# Patient Record
Sex: Female | Born: 1960 | Race: Black or African American | Hispanic: No | Marital: Single | Smoking: Former smoker
Health system: Southern US, Community
[De-identification: ages and names within clinical notes are randomized; demographics above are authoritative.]

## PROBLEM LIST (undated history)

## (undated) DIAGNOSIS — E7800 Pure hypercholesterolemia, unspecified: Secondary | ICD-10-CM

## (undated) DIAGNOSIS — F419 Anxiety disorder, unspecified: Secondary | ICD-10-CM

## (undated) DIAGNOSIS — Q8501 Neurofibromatosis, type 1: Secondary | ICD-10-CM

## (undated) DIAGNOSIS — I10 Essential (primary) hypertension: Secondary | ICD-10-CM

## (undated) DIAGNOSIS — C3490 Malignant neoplasm of unspecified part of unspecified bronchus or lung: Secondary | ICD-10-CM

## (undated) HISTORY — DX: Neurofibromatosis, type 1: Q85.01

## (undated) HISTORY — PX: ABDOMINAL HYSTERECTOMY: SHX81

## (undated) MED FILL — Fosaprepitant Dimeglumine For IV Infusion 150 MG (Base Eq): INTRAVENOUS | Qty: 5 | Status: AC

---

## 2001-03-31 ENCOUNTER — Encounter: Payer: Self-pay | Admitting: Emergency Medicine

## 2001-03-31 ENCOUNTER — Emergency Department (HOSPITAL_COMMUNITY): Admission: EM | Admit: 2001-03-31 | Discharge: 2001-03-31 | Payer: Self-pay | Admitting: Emergency Medicine

## 2001-10-29 ENCOUNTER — Encounter: Payer: Self-pay | Admitting: *Deleted

## 2001-10-29 ENCOUNTER — Ambulatory Visit (HOSPITAL_COMMUNITY): Admission: RE | Admit: 2001-10-29 | Discharge: 2001-10-29 | Payer: Self-pay | Admitting: *Deleted

## 2002-10-08 ENCOUNTER — Other Ambulatory Visit: Admission: RE | Admit: 2002-10-08 | Discharge: 2002-10-08 | Payer: Self-pay | Admitting: *Deleted

## 2002-10-22 ENCOUNTER — Inpatient Hospital Stay (HOSPITAL_COMMUNITY): Admission: RE | Admit: 2002-10-22 | Discharge: 2002-10-24 | Payer: Self-pay | Admitting: *Deleted

## 2003-11-01 ENCOUNTER — Emergency Department (HOSPITAL_COMMUNITY): Admission: EM | Admit: 2003-11-01 | Discharge: 2003-11-01 | Payer: Self-pay | Admitting: Emergency Medicine

## 2003-12-22 ENCOUNTER — Ambulatory Visit (HOSPITAL_COMMUNITY): Admission: RE | Admit: 2003-12-22 | Discharge: 2003-12-22 | Payer: Self-pay | Admitting: Family Medicine

## 2004-11-18 ENCOUNTER — Ambulatory Visit (HOSPITAL_COMMUNITY): Admission: RE | Admit: 2004-11-18 | Discharge: 2004-11-18 | Payer: Self-pay | Admitting: Family Medicine

## 2005-07-12 ENCOUNTER — Ambulatory Visit (HOSPITAL_COMMUNITY): Admission: RE | Admit: 2005-07-12 | Discharge: 2005-07-12 | Payer: Self-pay | Admitting: Family Medicine

## 2005-08-29 ENCOUNTER — Ambulatory Visit (HOSPITAL_COMMUNITY): Admission: RE | Admit: 2005-08-29 | Discharge: 2005-08-29 | Payer: Self-pay | Admitting: Internal Medicine

## 2006-05-06 ENCOUNTER — Emergency Department (HOSPITAL_COMMUNITY): Admission: EM | Admit: 2006-05-06 | Discharge: 2006-05-06 | Payer: Self-pay | Admitting: Emergency Medicine

## 2006-06-07 ENCOUNTER — Emergency Department (HOSPITAL_COMMUNITY): Admission: EM | Admit: 2006-06-07 | Discharge: 2006-06-07 | Payer: Self-pay | Admitting: Emergency Medicine

## 2006-10-08 ENCOUNTER — Emergency Department (HOSPITAL_COMMUNITY): Admission: EM | Admit: 2006-10-08 | Discharge: 2006-10-08 | Payer: Self-pay | Admitting: Emergency Medicine

## 2007-01-16 ENCOUNTER — Emergency Department (HOSPITAL_COMMUNITY): Admission: EM | Admit: 2007-01-16 | Discharge: 2007-01-16 | Payer: Self-pay | Admitting: Emergency Medicine

## 2007-01-25 ENCOUNTER — Ambulatory Visit: Payer: Self-pay | Admitting: Orthopedic Surgery

## 2007-02-15 ENCOUNTER — Encounter: Payer: Self-pay | Admitting: Orthopedic Surgery

## 2007-02-15 ENCOUNTER — Ambulatory Visit (HOSPITAL_COMMUNITY): Admission: RE | Admit: 2007-02-15 | Discharge: 2007-02-15 | Payer: Self-pay | Admitting: Family Medicine

## 2008-01-02 ENCOUNTER — Ambulatory Visit: Payer: Self-pay | Admitting: Orthopedic Surgery

## 2008-01-02 DIAGNOSIS — M545 Low back pain: Secondary | ICD-10-CM

## 2008-01-18 ENCOUNTER — Encounter: Payer: Self-pay | Admitting: Orthopedic Surgery

## 2008-08-05 ENCOUNTER — Emergency Department (HOSPITAL_COMMUNITY): Admission: EM | Admit: 2008-08-05 | Discharge: 2008-08-05 | Payer: Self-pay | Admitting: Emergency Medicine

## 2008-08-28 ENCOUNTER — Ambulatory Visit (HOSPITAL_COMMUNITY): Admission: RE | Admit: 2008-08-28 | Discharge: 2008-08-28 | Payer: Self-pay | Admitting: Family Medicine

## 2009-11-23 ENCOUNTER — Ambulatory Visit (HOSPITAL_COMMUNITY): Admission: RE | Admit: 2009-11-23 | Discharge: 2009-11-23 | Payer: Self-pay | Admitting: Family Medicine

## 2010-09-04 ENCOUNTER — Encounter: Payer: Self-pay | Admitting: Family Medicine

## 2010-09-05 ENCOUNTER — Encounter: Payer: Self-pay | Admitting: *Deleted

## 2010-12-31 NOTE — Op Note (Signed)
NAME:  Denise Rivas, Denise Rivas                      ACCOUNT NO.:  [id number]   MEDICAL RECORD NO.:  [id number]                   PATIENT TYPE:  INP   LOCATION:  A419                                 FACILITY:  [facility]   PHYSICIAN:  Langley Gauss, M.D.                DATE OF BIRTH:  [birth year]   DATE OF PROCEDURE:  10/22/2002  DATE OF DISCHARGE:                                 OPERATIVE REPORT   PREOPERATIVE DIAGNOSES:  1. Menometorrhagia.  2. Dysmenorrhea.  3. Uterine fibroids.   PROCEDURE PERFORMED:  TAH/RSO with the left ovary found to be surgically  absent at time of the operative procedure.   SURGEON:  Langley Gauss, M.D.   ESTIMATED BLOOD LOSS:  250 mL.   ANESTHESIA:  General endotracheal.   DRAINS:  1. Foley catheter to straight drainage.  2. J-P catheter within the subcutaneous space.   SPECIMENS:  For permanent section only.   FINDINGS:  The patient was taken to the operating room.  Vital signs were  stable.  The patient underwent uncomplicated induction of general  endotracheal anesthesia, after which time she was prepped and draped in the  usual sterile manner, and Foley catheter is sterilely placed to straight  drainage with findings of clear-yellow urine.  A sharp knife is then used to  incise the previous Pfannenstiel incision in the skin, dissected down to the  fascial plane utilizing sharp knife, cauterizing bleeders along the way.  Multiple bleeders were cauterized on the belly of the rectus muscle itself.  The fascia was sharply dissected off the underlying rectus muscle utilizing  the Mayo scissors.  Fascial ledges are grasped using straight Kocher clamps,  and then fascia is dissected off the endo and rectus muscle and in the  midline both superiorly and inferiorly, utilizing sharp dissection with the  Mayo scissors.  The rectus muscles were then bluntly separated.  Peritoneal  cavity is atraumatically bluntly entered at the superior-most portion of the  incision.  Peritoneal incision is then extended superiorly and inferiorly.  Inferiorly we directly visualized the bladder to avoid accidental injury.  Self-retaining retractor was then placed to include a bladder blade.  The  uterus manipulated.  There was noted to be an adhesion between small bowel  and the right ovary which was noted to have a 2 cm cystic lesion present.  The left ovary was noted to be surgically absent.  There is history of a  prior tubal ligation.  Moist packs were then used to immobilize bowel out of  the operative field.  Long straight Kocher clamps were then used to grasp  the specimen at the junction of the round ligament and fallopian tube with  the uterus itself.  The right round ligament was then identified.  It was  clamped and secured utilizing 0 Vicryl in a Heaney fashion.  It was then  transected and skeletonization of the right infundibulopelvic ligament was  performed.  The ureter was found to be in its normal anatomic position along  the lateral pelvic sidewall.  The infundibulopelvic ligament on the right is  then doubly clamped utilizing Kelly clamps and then doubly sutured, first  utilizing 0 Vicryl free tie, followed by suture ligature of 0 Vicryl in a  Heaney fashion.  Incidentally during this procedure, a 2 cm simple cyst on  the right is noted to burst with findings of clear follicular fluid.  I then  turned my attention to the left side at which time I was unable to identify  a left ovary, consistent with prior surgical removal.  The left round  ligament was sutured utilizing a 0 Vicryl in a Heaney fashion and what  remains of the infundibulopelvic ligament is then likewise secured utilizing  two sutures, first a free tie of 0 Vicryl followed by a suture ligature of 0  Vicryl in a Heaney fashion.  This then allows me to continue anteriorly  across the lower uterine segment to create a bladder flap from the  vesicouterine fold in the avascular plane.   It is dissected utilizing both  sharp and blunt dissection to push the bladder distal to the cervix itself.  In the process of doing this, I was also skeletonizing the right uterine  vessel which is then clamped with a Kelly clamp, followed by curved Heaney  clamp, followed by single ligature of 0 Vicryl to secure hemostasis on the  right.  Likewise, on the left, the left uterine vessel is skeletonized,  followed by placing a curved Heaney clamp there, followed by suture ligature  of 0 Vicryl.  This results in interruption of the major blood supply to the  uterus, and it is noted to have significant blanching effect at this time of  the uterine fundus.  On the right, then a long straight Heaney clamp is used  to secure the cardinal ligament, followed by suture ligature of 0 Vicryl in  a Heaney fashion; the same is performed on the left.  A second straight  Heaney clamp is then placed on the right to secure the right uterosacral  ligament, followed by suture ligature of 0 Vicryl in a Heaney fashion, in a  likewise manner performed on the left.  At all times, the clamp is placed as  close as possible to the cervix to avoid any lateral injury to the ureters.  This then brought me down to the level of the vaginal angles.  The right  vaginal angle was clamped with a curved Heaney clamp followed by suture  ligature of 0 Vicryl in a Heaney fashion.  Left vaginal angle was then  likewise clamped with curved Heaney clamp followed by suture ligature of 0  Vicryl in a Heaney fashion.  These two are tagged for later inspection.  This then allows me to transect across the vaginal cuff, attempting to  maintain maximal vaginal length while removing the entirety of the cervix.  The specimen is then removed and examined and noted to have the entire  cervix incorporated.  The edges of the vaginal mucosa at the cuff were then  grasped utilizing straight Kocher clamps.  Irrigation is performed utilizing a  Betadine solution, and the cuff is then closed utilizing a total of four  figure-of-eight sutures of 0 Vicryl to result in excellent hemostasis.  Irrigation is then performed of the pelvic cavity.  Hemostasis is assured  within the pelvic cavity where there was noted to be a small  amount of  bleeding from the right round ligament which is then on subsequent occasions  controlled by attempting to grasp the bleeder with a Vanderbilt clamp  followed by suture ligature of 0 Vicryl and a third attempt while placing  various superficial sutures, I was able to secure hemostasis on the right  round ligament area.  Copious irrigation was then again performed until  clear.  Large bowel was returned to the pelvic cavity.  Peritoneal edges are  then grasped using Kelly clamps; the Balfour retractor is removed as well as  moist packs.  Sponge and instrument counts are correct x 2 at this point.  The peritoneal edges are then reapproximated closed utilizing 0 chromic in a  running fashion with irrigation again performed of the pelvic cavity just  prior to closure to assure hemostasis.  The rectus muscles then  reapproximated midline utilizing two layer closure of 0 chromic in a running  fashion.  Secure bites are taken, as there was noted to be multiple small  bleeders along the rectus muscle itself which are cauterized.  With all  rectus muscle bleeders cauterized, the fascia is then closed with a  continuous running 0 PDS suture.  Subcutaneous bleeders are then cauterized.  The J-P drain is placed within the subcutaneous space where this prevents it  going to the right apex annexation.  J-P drain is sutured into place.  The  skin is then completely closed utilizing skin staples.  To facilitate  postoperative analgesia, a total of 30 mL of 0.5% bupivacaine plain is  injected along the entire incision.  The patient continues to drain clear-  yellow urine.  She is then reversed of anesthesia, taken to  recovery room in  stable condition with some operative findings discussed with the patient's  awaiting family.                                               Langley Gauss, M.D.    DC/MEDQ  D:  10/22/2002  T:  10/22/2002  Job:  [id number]

## 2010-12-31 NOTE — Discharge Summary (Signed)
NAME:  Denise Rivas, GRILLI                      ACCOUNT NO.:  [id number]   MEDICAL RECORD NO.:  [id number]                   PATIENT TYPE:  INP   LOCATION:  A419                                 FACILITY:  [facility]   PHYSICIAN:  Langley Gauss, M.D.                DATE OF BIRTH:  May 01, 1961   DATE OF ADMISSION:  10/22/2002  DATE OF DISCHARGE:  10/24/2002                                 DISCHARGE SUMMARY   DISPOSITION:  Given copies of standard discharge instructions at time of  discharge.  The patient is to call and follow up in the office in four days'  time for staple removal from the Pfannenstiel incision.  The JP drain is  removed on day of discharge.   DISCHARGE MEDICATIONS:  1. Estrace 1 mg p.o. daily.  2. Tylox 1-2 every four hours p.r.n. postoperative pain #30 with no refill.   LABORATORY DATA:  Pertinent laboratory studies:  O+ blood type, hCG is  negative.  Electrolytes within normal limits.  Admission hemoglobin and  hematocrit 12.9/39.8 with a white count of 6.1.  On postoperative day #1,  hemoglobin 9.8, hematocrit 30.1.  On postoperative day #2, hemoglobin had  equilibrated with a hemoglobin of 10.6, hematocrit 32.7.  Final pathology is  currently pending.   HOSPITAL COURSE:  The patient underwent TAH/RSO on October 22, 2002.  Operative  procedure was without complications.  The patient had a prior Pfannenstiel  incision which we utilized at time of the procedure.  The left ovary was  noted to be absent at time of the operative procedure.  The patient's  postoperative course was complicated by on postoperative day #1 findings of  bright red blood seeping around the dressing.  I was thus contacted for  immediate assessment.  At that time, her dressing was removed.  There was  noted to be about 25 mL of clotted blood along the incision line itself but  no active bleeding and no significant induration; however, there was a  concern of some subcutaneous bleeding or hematoma  formation postoperatively,  thus during the operative procedure many vessels had been cauterized in the  subcutaneous fat, in addition with the hemoglobin falling to 9.8 on  postoperative day #1.  This was a larger fall than expected.  The JP drain  was aspirated and a pressure dressing was applied.  The patient continued to  do well on the evening of postoperative day #1.  The patient did have  significant gas pain but was unable to have a bowel movement.  She was  treated with a Dulcolax suppository, at which time she did move her bowels.  Thereafter, on October 24, 2002 the patient is ambulating, tolerating a  regular general diet very well.  No complaints of dizziness or headaches.  On examination, the abdomen is obese, soft, and nontender.  There is no  evidence of any renewed bleeding visibly or in the subcutaneous  tissue.  Thus, on postoperative day #2, the patient is discharged to home.  The  patient has decided that she would prefer to use an oral estrogen rather  than continue to use the patch postoperatively.                                               Langley Gauss, M.D.    DC/MEDQ  D:  10/24/2002  T:  10/24/2002  Job:  [id number]

## 2010-12-31 NOTE — H&P (Signed)
NAME:  Denise Rivas, Denise Rivas                      ACCOUNT NO.:  [id number]   MEDICAL RECORD NO.:  [id number]                   PATIENT TYPE:  AMB   LOCATION:  DAY                                  FACILITY:  [facility]   PHYSICIAN:  Langley Gauss, M.D.                DATE OF BIRTH:  Oct 06, 1960   DATE OF ADMISSION:  10/22/2002  DATE OF DISCHARGE:                                HISTORY & PHYSICAL   HISTORY OF PRESENT ILLNESS:  This is a 50 year old black female gravida 1,  para 1 with the primary complaint of menometrorrhagia and uterine fibroids,  as well as dysmenorrhea, who is admitted for a TAH/BSO.  The risks and  benefits of the operative procedure were discussed with the patient, at  which time she elects to proceed with definitive surgical management.  She  understands that removal of the ovaries will require initiation of hormonal  replacement therapy to avoid symptoms of surgical menopause.   The patient's history is that she was initially seen as a self-referral on  October 23, 2000, at which time she complained of a 15-year duration of very  heavy menses.  At that time, she stated her menstrual periods occurred on a  monthly basis with four days of flow but passing clots that looked like  meat.  She had previously taken birth control pills x1 month's duration,  which did not provide any help in November 2001.  She declines any repeat  trial of oral contraceptive.  The patient has been taking 800 mg of Motrin 3-  4 times a day previously, which did not help abate the bleeding quantity or  the cramping.   PAST MEDICAL HISTORY:  1. Tubal ligation in 1981.  2. Hernia repair in 1981.  3. She has one prior vaginal delivery.  4. No other medical or surgical illness.  5. The patient is noted to have bulging disks by a previous MRI study.  6. Open cholecystectomy.   ALLERGIES:  She has no known drug allergies.   SOCIAL HISTORY:  The patient does smoke one-half pack per day and is  unemployed.   REVIEW OF SYSTEMS:  Pertinent for no history of genuine stress urinary  incontinence.  The patient does, however, have occasional episodes where she  cannot hold her urine.   PHYSICAL EXAMINATION:  GENERAL:  A black female.  VITAL SIGNS:  Height is 5 feet 0 inches.  Weight 124.  Blood pressure  132/97, 128/86.  Pulse of 77.  HEENT:  Negative.  Neck is supple.  Mucous membranes are moist.  Thyroid is  nonpalpable.  LUNGS:  Clear.  CARDIOVASCULAR:  Regular rate and rhythm.  ABDOMEN:  Soft and nontender.  The patient has a small previous Pfannenstiel  incision from a prior open cholecystectomy.  EXTREMITIES:  Normal.  PELVIC:  Normal external genitalia.  No lesions or ulcerations identified.  Bimanual examination reveals a diffusely enlarged globular uterus  with the  adnexa noted to be nonpalpable.   LABORATORY STUDIES:  A previous pelvic ultrasound done June 19, 2000  revealed a 2.5-cm diameter uterine fibroid in the right fundal portion.  Endometrial stripe upper normal at 8.6.  Previous MRI's have revealed  degenerative disk disease L4-5, L5-S1 and to a lesser degree L3-4.  No disk  herniation was identified.  Endometrial biopsy performed in the office was  negative for hyperplasia and negative for any atypia or endometrial cancer.   ASSESSMENT:  A 50 year old patient represented this year, now with  complaints of increasingly heavy menstrual periods that are now becoming  very irregular since previous visit.  At that point in time, she was not  prepared to but at this visit she did state her lifestyle was compromised  enough that she is willing to proceed with definitive surgical therapy.  Thus, on October 22, 2002, she will be taken to the operating room for  performance of a total abdominal hysterectomy/bilateral salpingo-  oophorectomy.                                               Langley Gauss, M.D.    DC/MEDQ  D:  10/21/2002  T:  10/21/2002  Job:   [id number]

## 2011-12-28 ENCOUNTER — Other Ambulatory Visit (HOSPITAL_COMMUNITY): Payer: Self-pay | Admitting: Family Medicine

## 2011-12-28 DIAGNOSIS — Z139 Encounter for screening, unspecified: Secondary | ICD-10-CM

## 2012-01-23 ENCOUNTER — Ambulatory Visit (HOSPITAL_COMMUNITY): Payer: Self-pay

## 2012-01-23 ENCOUNTER — Ambulatory Visit (HOSPITAL_COMMUNITY)
Admission: RE | Admit: 2012-01-23 | Discharge: 2012-01-23 | Disposition: A | Discharge status: Home / Self Care | Payer: Medicaid Other | Source: Ambulatory Visit | Attending: Family Medicine | Admitting: Family Medicine

## 2012-01-23 DIAGNOSIS — Z139 Encounter for screening, unspecified: Secondary | ICD-10-CM

## 2012-01-23 DIAGNOSIS — Z1231 Encounter for screening mammogram for malignant neoplasm of breast: Secondary | ICD-10-CM | POA: Insufficient documentation

## 2013-04-29 ENCOUNTER — Other Ambulatory Visit (HOSPITAL_COMMUNITY): Payer: Self-pay | Admitting: Family Medicine

## 2013-04-29 DIAGNOSIS — Z139 Encounter for screening, unspecified: Secondary | ICD-10-CM

## 2013-05-02 ENCOUNTER — Ambulatory Visit (HOSPITAL_COMMUNITY)
Admission: RE | Admit: 2013-05-02 | Discharge: 2013-05-02 | Disposition: A | Discharge status: Home / Self Care | Payer: PRIVATE HEALTH INSURANCE | Source: Ambulatory Visit | Attending: Family Medicine | Admitting: Family Medicine

## 2013-05-02 DIAGNOSIS — Z1231 Encounter for screening mammogram for malignant neoplasm of breast: Secondary | ICD-10-CM | POA: Insufficient documentation

## 2013-05-02 DIAGNOSIS — Z139 Encounter for screening, unspecified: Secondary | ICD-10-CM

## 2014-05-01 ENCOUNTER — Other Ambulatory Visit (HOSPITAL_COMMUNITY): Payer: Self-pay | Admitting: Family Medicine

## 2014-05-01 DIAGNOSIS — Z139 Encounter for screening, unspecified: Secondary | ICD-10-CM

## 2014-05-05 ENCOUNTER — Ambulatory Visit (HOSPITAL_COMMUNITY)
Admission: RE | Admit: 2014-05-05 | Discharge: 2014-05-05 | Disposition: A | Discharge status: Home / Self Care | Payer: Medicare HMO | Source: Ambulatory Visit | Attending: Family Medicine | Admitting: Family Medicine

## 2014-05-05 DIAGNOSIS — Z139 Encounter for screening, unspecified: Secondary | ICD-10-CM | POA: Insufficient documentation

## 2014-10-06 ENCOUNTER — Ambulatory Visit (HOSPITAL_COMMUNITY)
Admission: RE | Admit: 2014-10-06 | Discharge: 2014-10-06 | Disposition: A | Discharge status: Home / Self Care | Payer: Medicare Other | Source: Ambulatory Visit | Attending: Family Medicine | Admitting: Family Medicine

## 2014-10-06 ENCOUNTER — Other Ambulatory Visit (HOSPITAL_COMMUNITY)
Admission: RE | Admit: 2014-10-06 | Discharge: 2014-10-06 | Disposition: A | Discharge status: Home / Self Care | Payer: Medicare Other | Source: Ambulatory Visit | Attending: Family Medicine | Admitting: Family Medicine

## 2014-10-06 ENCOUNTER — Other Ambulatory Visit (HOSPITAL_COMMUNITY): Payer: Self-pay | Admitting: Family Medicine

## 2014-10-06 DIAGNOSIS — M719 Bursopathy, unspecified: Secondary | ICD-10-CM

## 2014-10-06 DIAGNOSIS — M19011 Primary osteoarthritis, right shoulder: Secondary | ICD-10-CM

## 2014-10-06 DIAGNOSIS — M25511 Pain in right shoulder: Secondary | ICD-10-CM | POA: Insufficient documentation

## 2014-10-06 LAB — CBC WITH DIFFERENTIAL/PLATELET
BASOS ABS: 0 10*3/uL (ref 0.0–0.1)
Basophils Relative: 0 % (ref 0–1)
EOS ABS: 0.1 10*3/uL (ref 0.0–0.7)
EOS PCT: 1 % (ref 0–5)
HCT: 43.8 % (ref 36.0–46.0)
HEMOGLOBIN: 14.5 g/dL (ref 12.0–15.0)
LYMPHS ABS: 1.6 10*3/uL (ref 0.7–4.0)
Lymphocytes Relative: 22 % (ref 12–46)
MCH: 31.9 pg (ref 26.0–34.0)
MCHC: 33.1 g/dL (ref 30.0–36.0)
MCV: 96.3 fL (ref 78.0–100.0)
Monocytes Absolute: 0.6 10*3/uL (ref 0.1–1.0)
Monocytes Relative: 8 % (ref 3–12)
NEUTROS PCT: 69 % (ref 43–77)
Neutro Abs: 4.9 10*3/uL (ref 1.7–7.7)
Platelets: 266 10*3/uL (ref 150–400)
RBC: 4.55 MIL/uL (ref 3.87–5.11)
RDW: 13.6 % (ref 11.5–15.5)
WBC: 7.1 10*3/uL (ref 4.0–10.5)

## 2014-10-06 LAB — LIPID PANEL
Cholesterol: 188 mg/dL (ref 0–200)
HDL: 71 mg/dL (ref >39)
LDL Cholesterol: 102 mg/dL — ABNORMAL HIGH (ref 0–99)
TRIGLYCERIDES: 75 mg/dL (ref <150)
Total CHOL/HDL Ratio: 2.6 RATIO
VLDL: 15 mg/dL (ref 0–40)

## 2014-10-06 LAB — COMPREHENSIVE METABOLIC PANEL
ALBUMIN: 4.1 g/dL (ref 3.5–5.2)
ALT: 14 U/L (ref 0–35)
AST: 20 U/L (ref 0–37)
Alkaline Phosphatase: 81 U/L (ref 39–117)
Anion gap: 5 (ref 5–15)
BUN: 18 mg/dL (ref 6–23)
CALCIUM: 9.4 mg/dL (ref 8.4–10.5)
CO2: 27 mmol/L (ref 19–32)
Chloride: 108 mmol/L (ref 96–112)
Creatinine, Ser: 0.61 mg/dL (ref 0.50–1.10)
GFR calc Af Amer: >90 mL/min (ref >90)
GFR calc non Af Amer: >90 mL/min (ref >90)
Glucose, Bld: 108 mg/dL — ABNORMAL HIGH (ref 70–99)
Potassium: 4.1 mmol/L (ref 3.5–5.1)
Sodium: 140 mmol/L (ref 135–145)
Total Bilirubin: 0.5 mg/dL (ref 0.3–1.2)
Total Protein: 7.5 g/dL (ref 6.0–8.3)

## 2014-10-06 LAB — TSH: TSH: 0.505 u[IU]/mL (ref 0.350–4.500)

## 2014-10-07 LAB — T4: T4 TOTAL: 6.9 ug/dL (ref 4.5–12.0)

## 2014-12-09 ENCOUNTER — Ambulatory Visit (INDEPENDENT_AMBULATORY_CARE_PROVIDER_SITE_OTHER): Payer: Medicare Other | Admitting: Orthopedic Surgery

## 2014-12-09 VITALS — BP 116/82 | Ht 61.0 in | Wt 123.4 lb

## 2014-12-09 DIAGNOSIS — M545 Low back pain: Secondary | ICD-10-CM

## 2014-12-09 DIAGNOSIS — M75101 Unspecified rotator cuff tear or rupture of right shoulder, not specified as traumatic: Secondary | ICD-10-CM | POA: Diagnosis not present

## 2014-12-09 MED ORDER — DICLOFENAC POTASSIUM 50 MG PO TABS: 50.0000 mg | ORAL_TABLET | Freq: Two times a day (BID) | ORAL | Status: DC

## 2014-12-09 NOTE — Patient Instructions (Signed)
Call [facility] to schedule PT and OT

## 2014-12-09 NOTE — Progress Notes (Signed)
Patient ID: Denise Rivas, female   DOB: [birth year], 54 y.o.   MRN: [id number] Patient ID: Denise Rivas, female   DOB: 1961-04-11, 54 y.o.   MRN: [id number]  Chief Complaint  Patient presents with  . Shoulder Pain    right shoulder pain, no known injury, REF. [place]    HPI [name] is a 54 y.o. female.  Presents to [place] for evaluation of her right shoulder with also complaints of lower back pain nonradicular in nature  As far as the shoulder goes she's had pain for several months with no history of trauma. She reports pain over the right deltoid area associated with stiffness radiating pain in the neck and upper arm which is constant 9 out of 10 and not relieved by naproxen 500. X-rays and injections have already been attempted without relief and she does not want a new injection today although it was strongly recommended  Medical history of hypertension and arthritis she's had a hysterectomy and a cholecystectomy   She has no allergies  Family history is noted for diabetes asthma COPD reflux depression high blood pressure heart attack cancer arthritis mental illness  She also has multiple skin lesions all over her body with no apparent diagnosis that she can remember only noting that she said the doctors have said something in my blood  She is disabled does not smoke or drink    Review of Systems Review of Systems She has a history of joint pain muscle weakness back pain anxiety numbness and tingling and burning in the legs and lightheadedness  Social History History  Substance Use Topics  . Smoking status: Not on file  . Smokeless tobacco: Not on file  . Alcohol Use: Not on file    Allergies not on file  Current Outpatient Prescriptions  Medication Sig Dispense Refill  . amLODipine (NORVASC) 5 MG tablet Take 5 mg by mouth daily.    . citalopram (CELEXA) 20 MG tablet Take 20 mg by mouth daily.    Marland Kitchen gabapentin (NEURONTIN) 300 MG capsule Take 300 mg by mouth  at bedtime.    . naproxen (NAPROSYN) 500 MG tablet Take 500 mg by mouth 2 (two) times daily with a meal.    . diclofenac (CATAFLAM) 50 MG tablet Take 1 tablet (50 mg total) by mouth 2 (two) times daily. 180 tablet 1   No current facility-administered medications for this visit.      Physical Exam Physical Exam Blood pressure 116/82, height  (1.549 m), weight 123 lb 6.4 oz (55.974 kg).  Gen. appearance well-developed well-nourished female she has these multiple skin lesions which are everywhere there lesions that protrude from the skin and have had like appearance nontender nonpigmented The patient is alert and oriented person place and time Mood is normal affect is normal Ambulatory status normal without assistive devices  Exam of the right shoulder  Inspection . Acromial tenderness ROM painful [place] elevation up to 110 active and 160 passive. 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  . Low back pain, unspecified back pain laterality, with sciatica presence unspecified   . Rotator cuff syndrome, right Yes        Plan    We recommended subacromial injection she declined we stopped her naproxen and put her on diclofenac for pain we send her to physical therapy. If she doesn't improve with told her that she has to get the injection as as part of the workup and  treatment for this problem of rotator cuff syndrome  We also sent her for lumbar spine stabilization exercises related to her unrelated back pain       Fuller CanadaStanley Kerim Statzer 12/09/2014, 9:21 AM

## 2014-12-23 ENCOUNTER — Ambulatory Visit (HOSPITAL_COMMUNITY): Payer: Medicare Other | Admitting: Physical Therapy

## 2014-12-23 ENCOUNTER — Ambulatory Visit (HOSPITAL_COMMUNITY): Payer: Medicare Other | Attending: Orthopedic Surgery

## 2014-12-23 ENCOUNTER — Encounter (HOSPITAL_COMMUNITY): Payer: Self-pay

## 2014-12-23 DIAGNOSIS — M25811 Other specified joint disorders, right shoulder: Secondary | ICD-10-CM | POA: Insufficient documentation

## 2014-12-23 DIAGNOSIS — M25511 Pain in right shoulder: Secondary | ICD-10-CM | POA: Diagnosis not present

## 2014-12-23 DIAGNOSIS — M6289 Other specified disorders of muscle: Secondary | ICD-10-CM

## 2014-12-23 DIAGNOSIS — R29898 Other symptoms and signs involving the musculoskeletal system: Secondary | ICD-10-CM

## 2014-12-23 DIAGNOSIS — R6889 Other general symptoms and signs: Secondary | ICD-10-CM

## 2014-12-23 DIAGNOSIS — M5441 Lumbago with sciatica, right side: Secondary | ICD-10-CM

## 2014-12-23 DIAGNOSIS — M25611 Stiffness of right shoulder, not elsewhere classified: Secondary | ICD-10-CM

## 2014-12-23 DIAGNOSIS — M629 Disorder of muscle, unspecified: Secondary | ICD-10-CM | POA: Diagnosis not present

## 2014-12-23 DIAGNOSIS — M75101 Unspecified rotator cuff tear or rupture of right shoulder, not specified as traumatic: Secondary | ICD-10-CM | POA: Diagnosis not present

## 2014-12-23 NOTE — Therapy (Addendum)
[place] [facility] [address] [place], [place], [address] Phone: [phone]   Fax:  [phone]  Occupational Therapy Evaluation  Patient Details  Name: Denise HoffMary L Rivas MRN: [id number] Date of Birth: 01/11/1961 Referring Provider:  [name], Stanley E, MD  Encounter Date: 12/23/2014      OT End of Session - 12/23/14 0942    Visit Number 1   Number of Visits 8   Date for OT Re-Evaluation 01/20/15   Authorization Type UHC Medicare primary Medicaid secondary   Authorization Time Period before 10th visit   Authorization - Visit Number 1   Authorization - Number of Visits 10   OT Start Time 0848   OT Stop Time 0930   OT Time Calculation (min) 42 min   Activity Tolerance Patient tolerated treatment well   Behavior During Therapy [facility] for tasks assessed/performed      History reviewed. No pertinent past medical history.  No past surgical history on file.  There were no vitals filed for this visit.  Visit Diagnosis:  Rotator cuff syndrome of right shoulder - Plan: Ot plan of care cert/re-cert  Pain in joint, shoulder region, right - Plan: Ot plan of care cert/re-cert  Weakness of right arm - Plan: Ot plan of care cert/re-cert  Tight fascia - Plan: Ot plan of care cert/re-cert  Decreased range of motion of shoulder, right - Plan: Ot plan of care cert/re-cert      Subjective Assessment - 12/23/14 0854    Subjective  S: I don't always have pain. it comes and goes.    Pertinent History Patient is a 54 y/o female s/p right rotator cuff syndrome which she states has been ongoing for a year now. Patient reports no injury to shoulder. Pt states that she doesn't have consistant pain. Pain is intermittent and it comes and goes. [name] has referred patient to occupational therapy for evaulation and treatment.    Special Tests FOTO: 61/100   Patient Stated Goals TO decrease pain.   Currently in Pain? No/denies  periodic pain/stabbing/  10/10           [facility] - 12/23/14 0854    Assessment   Diagnosis Right rotator cuff syndrome   Onset Date --  approx. a year ago.   Prior Therapy None   Precautions   Precautions None   Restrictions   Weight Bearing Restrictions No   Balance Screen   Has the patient fallen in the past 6 months No   Home  Environment   Family/patient expects to be discharged to: Private residence   Living Arrangements Alone   Prior Function   Level of Independence Independent with basic ADLs;Independent with gait   ADL   ADL comments Pt reports that she is able to do all daily and leisure tasks regardless of the pain. Pt states that she'll notice the pain after she has mowing the lawn for example or have been working with her flowers in the garden.    Mobility   Mobility Status Independent   Written Expression   Dominant Hand Right   Vision - History   Baseline Vision No visual deficits   Cognition   Overall Cognitive Status Within Functional Limits for tasks assessed   Observation/Other Assessments   Focus on Therapeutic Outcomes (FOTO)  61   ROM / Strength   AROM / PROM / Strength AROM;Strength;PROM   Palpation   Palpation Max trigger points/tender points in right upper arm, trapezius,  and scapularis region.   AROM   Overall AROM Comments Assessed seated. IR/ER adducted.   AROM Assessment Site Shoulder   Right/Left Shoulder Right   Right Shoulder Flexion 140 Degrees   Right Shoulder ABduction 115 Degrees   Right Shoulder Internal Rotation 90 Degrees   Right Shoulder External Rotation 90 Degrees   PROM   Overall PROM Comments Assessed seated. IR/ER adducted.   PROM Assessment Site Shoulder   Right/Left Shoulder Right   Right Shoulder Flexion 149 Degrees   Right Shoulder ABduction 121 Degrees   Right Shoulder Internal Rotation 90 Degrees   Right Shoulder External Rotation 90 Degrees   Strength   Overall Strength Comments Assessed seated. IR/ER adducted.   Strength  Assessment Site Shoulder   Right/Left Shoulder Right   Right Shoulder Flexion 5/5   Right Shoulder ABduction 5/5   Right Shoulder Internal Rotation 4+/5   Right Shoulder External Rotation 4+/5                         OT Education - 12/28/2014 0941    Education provided Yes   Education Details shoulder stretches and theraband scapular strengthening exercises   Person(s) Educated Patient   Methods Explanation;Demonstration;Handout   Comprehension Verbalized understanding;Need further instruction;Returned demonstration;Verbal cues required;Tactile cues required          OT Short Term Goals - 12-28-2014 0947    OT SHORT TERM GOAL #1   Title Patient will be educated and independent with HEP.   Time 4   Period Weeks   Status New   OT SHORT TERM GOAL #2   Title Patient will decrease pain level to 5/10 or less after completing lawn mowing.    Time 4   Period Weeks   Status New   OT SHORT TERM GOAL #3   Title Patient will increase scapular stability and posture by 75%.   Time 4   Period Weeks   Status New   OT SHORT TERM GOAL #4   Title Patient increase RUE shoulder strength to 5/5.   Time 4   Period Weeks   Status New   OT SHORT TERM GOAL #5   Title Patient will decrease fascial restrctions and trigger points to trace amount.    Time 4   Period Weeks   Status New                  Plan - 2014/12/28 0943    Clinical Impression Statement A: Pt is a 54 y/o female s/p right rotator cuff syndrome causing increased pain and fascial restrctions and decreased strength, scapular stability, and ROM resulting in discomfort after completing daily tasks.    Pt will benefit from skilled therapeutic intervention in order to improve on the following deficits (Retired) Decreased range of motion;Increased fascial restricitons;Pain;Decreased strength   Rehab Potential Excellent   OT Frequency 2x / week   OT Duration 4 weeks   OT Treatment/Interventions Self-care/ADL  training;Therapeutic exercise;Patient/family education;Manual Therapy;Ultrasound;Therapeutic activities;Cryotherapy;Electrical Stimulation;Passive range of motion;Moist Heat   Plan P: Pt will benefit from skilled OT services to increase overal functional performance during daily tasks and comfort level and strength of RUE. Treatment Plan: myofascial release and muscle energy technique (Patient is very tender in shoulder region even with light pressure), scapular strability and strengthening exercises, AROM, and general strengthening.    Consulted and Agree with Plan of Care Patient          G-Codes - 12/28/2014 9604  Functional Assessment Tool Used FOTO score: 61/100 (39% impaired)   Functional Limitation Carrying, moving and handling objects   Carrying, Moving and Handling Objects Current Status ([id number](G8984) At least 20 percent but less than 40 percent impaired, limited or restricted   Carrying, Moving and Handling Objects Goal Status ([id number](G8985) At least 1 percent but less than 20 percent impaired, limited or restricted      Problem List Patient Active Problem List   Diagnosis Date Noted  . BACK PAIN, LUMBAR 01/02/2008    Limmie PatriciaLaura Ralonda Tartt, OTR/L,CBIS  [phone]  12/23/2014, 9:58 AM  [facility] [address] [place], [place], [address] Phone: [phone]   Fax:  [phone]

## 2014-12-23 NOTE — Addendum Note (Signed)
Addended by: Limmie PatriciaESSENMACHER, Alvita Fana D on: 12/23/2014 10:00 AM   Modules accepted: Orders

## 2014-12-23 NOTE — Patient Instructions (Signed)
Knee-to-Chest Stretch: Bilateral   With hands behind knees, pull both knees in to chest until a comfortable stretch is felt in lower back and buttocks. Keep back relaxed. Hold __20__ seconds. Repeat __5__ times per set. Do _1___ sets per session. Do ___2_ sessions per day.  http://orth.exer.us/128   Copyright  VHI. All rights reserved.  Pelvic Tilt   Flatten back by tightening stomach muscles and buttocks. Repeat __10__ times per set. Do __1__ sets per session. Do _2___ sessions per day.  http://orth.exer.us/134   Copyright  VHI. All rights reserved.  Isometric Abdominal   Lying on back with knees bent, tighten stomach by pressing elbows down. Hold ___3_ seconds. Repeat __10__ times per set. Do _1___ sets per session. Do ___2_ sessions per day.  http://orth.exer.us/1086   Copyright  VHI. All rights reserved.  Bridging   Slowly raise buttocks from floor, keeping stomach tight. Repeat _10___ times per set. Do ___1_ sets per session. Do _2___ sessions per day.  http://orth.exer.us/1096   Copyright  VHI. All rights reserved.

## 2014-12-23 NOTE — Patient Instructions (Signed)
  Flexibility: Corner Stretch   Standing in corner with hands just above shoulder level and feet ____ inches from corner, lean forward until a comfortable stretch is felt across chest. Hold ____ seconds. Repeat ____ times per set. Do ____ sets per session. Do ____ sessions per day.    Flexors Stretch, Standing   Stand near wall and slide arm up, with palm facing away from wall, by leaning toward wall. Hold ___ seconds.  Repeat ___ times per session. Do ___ sessions per day.  Copyright  VHI. All rights reserved.   (Home) Extension: Isometric / Bilateral Arm Retraction - Sitting   Facing anchor, hold hands and elbow at shoulder height, with elbow bent.  Pull arms back to squeeze shoulder blades together. Repeat 10-15 times.  Copyright  VHI. All rights reserved.   (Home) Retraction: Row - Bilateral (Anchor)   Facing anchor, arms reaching forward, pull hands toward stomach, keeping elbows bent and at your sides and pinching shoulder blades together. Repeat 10-15 times.  Copyright  VHI. All rights reserved.   (Clinic) Extension / Flexion (Assist)   Face anchor, pull arms back, keeping elbow straight, and squeze shoulder blades together. Repeat 10-15 times.   Copyright  VHI. All rights reserved.

## 2014-12-23 NOTE — Therapy (Signed)
[facility] [address] [place], [place], [address] Phone: [phone]   Fax:  [phone]  Physical Therapy Evaluation  Patient Details  Name: Denise Rivas MRN: [id number] Date of Birth: 08/31/1960 Referring Provider:  [name], Stanley E, MD  Encounter Date: 12/23/2014      PT End of Session - 12/23/14 1005    Visit Number 1   Number of Visits 12   Date for PT Re-Evaluation 02/06/15   Authorization - Visit Number 1   Authorization - Number of Visits 10   PT Start Time 0930   PT Stop Time 1015   PT Time Calculation (min) 45 min   Activity Tolerance Patient tolerated treatment well      No past medical history on file.  No past surgical history on file.  There were no vitals filed for this visit.  Visit Diagnosis:  Midline low back pain with right-sided sciatica  Leg weakness, bilateral  Postural instability of trunk      Subjective Assessment - 12/23/14 0934    Subjective Denise Rivas has had chronic low back pain for years  but recently her pain has increased.  She states that her pain is mainly in the center and when she sits down her low back will be burning.  She occasionally has pain that goes into her upper Rt thigh.     How long can you sit comfortably? Pt sits slumped.   Pt states that the burning occurs almost immediately.     How long can you stand comfortably? able to stand for five minutes and then her back starts to burn.    How long can you walk comfortably? Pt does not walk    Patient Stated Goals no more burning in her back    Currently in Pain? Yes   Pain Score 10-Worst pain ever  therapist explained scale pt still states a 10; no signs of distress   Pain Location Back   Pain Orientation Lower   Pain Descriptors / Indicators Aching;Burning   Pain Onset More than a month ago   Pain Frequency Constant   Aggravating Factors  sitting    Pain Relieving Factors medication             [facility]  PT Assessment - 12/23/14 0816    Assessment   Medical Diagnosis Radicular back pain   Onset Date 11/28/14   Prior Therapy none   Precautions   Precautions None   Restrictions   Weight Bearing Restrictions No   Balance Screen   Has the patient fallen in the past 6 months No   Has the patient had a decrease in activity level because of a fear of falling?  Yes   Is the patient reluctant to leave their home because of a fear of falling?  No   Home Environment   Living Enviornment Private residence   Living Arrangements Alone   Type of Home House   Prior Function   Level of Independence Independent with basic ADLs   Vocation On disability   Cognition   Overall Cognitive Status Within Functional Limits for tasks assessed   Observation/Other Assessments   Focus on Therapeutic Outcomes (FOTO)  66   Posture/Postural Control   Posture/Postural Control Postural limitations   Postural Limitations Increased lumbar lordosis;Decreased thoracic kyphosis;Anterior pelvic tilt   AROM   Lumbar Flexion wnl reps no change   Lumbar Extension wnl reps no change    Lumbar - Right Side  Bend wnl   Lumbar - Left Side Bend wnl   Lumbar - Right Rotation decreased 20%   Lumbar - Left Rotation decreased 20%    Strength   Right Hip Flexion 3/5   Right Hip Extension 3-/5   Right Hip ABduction 5/5   Left Hip Flexion 5/5   Left Hip Extension 3/5   Left Hip ABduction 4/5   Right Knee Flexion 4-/5   Right Knee Extension 3+/5   Left Knee Flexion 4-/5   Left Knee Extension 4-/5   Right Ankle Dorsiflexion 3+/5   Left Ankle Dorsiflexion 4/5   Flexibility   Soft Tissue Assessment /Muscle Length yes   Hamstrings wfl    Quadriceps Rt heel 9" from buttock; LT 8" from buttock              [facility] Adult PT Treatment/Exercise - 12/23/14 0816    Exercises   Exercises Lumbar   Lumbar Exercises: Stretches   Double Knee to Chest Stretch 5 reps;10 seconds   Pelvic Tilt 5 reps   Lumbar Exercises: Supine   Ab  Set 5 reps   Bridge 5 reps            PT Education - 12/23/14 1003    Education provided Yes   Education Details abdominal strengthening exercises and bridges    Person(s) Educated Patient   Methods Explanation;Verbal cues;Handout   Comprehension Verbalized understanding;Returned demonstration;Need further instruction          PT Short Term Goals - 12/23/14 1013    PT SHORT TERM GOAL #1   Title I HEP   Time 2   Period Weeks   PT SHORT TERM GOAL #2   Title Pt pain to be no greater than a 7/10 90% of the day    Time 3   Period Weeks   PT SHORT TERM GOAL #3   Title Pt rotation to be wnl to allow pt to look at blind side when driving a car    Time 3   Period Weeks           PT Long Term Goals - 12/23/14 1015    PT LONG TERM GOAL #1   Title Pt to be I in advance HEP   Time 6   Period Weeks   PT LONG TERM GOAL #2   Title Pt pain to be no greater than a 5/10 80% of the day    Time 6   Period Weeks   PT LONG TERM GOAL #3   Title Pt to be able to sit for 40 minutes with comfort to allow car riding and eating of meals in comfort    Time 6   Period Weeks   PT LONG TERM GOAL #4   Title Pt to be able to stand for 15 minutes to be able to make a amall meal in comfort.   Time 6   Period Weeks   PT LONG TERM GOAL #5   Title Pt to be walking at least 15 minutes 4 times a week for better health habits    Time 6   Period Weeks               Plan - 12/23/14 1005    Clinical Impression Statement Pt is a 54 yo female who has had chronic back pain and is now being referred to physical therapy.  She has been referred to therapy to attempt to decrease her pain.  Examination demonstrates decreased core  and LE strength, poor sitting posture and decreased ROM.  Denise Rivas will benefit from skilled therapy to address these issues and decrease her pain in order to improve her quality of life.     Pt will benefit from skilled therapeutic intervention in order to improve  on the following deficits Decreased activity tolerance;Decreased range of motion;Decreased strength;Pain   Rehab Potential Good   PT Frequency 2x / week   PT Duration 6 weeks   PT Treatment/Interventions Therapeutic activities;Therapeutic exercise;Balance training;Patient/family education;Functional mobility training   PT Next Visit Plan Once again go over the importance of good sitting posture and the use of a towel roll while sitting.  Begin stabilization exercises including bent knee raise, SLR, and prone exercises.  Prone exercises to be completed with two pillows under lumbar area.  Progress to standing activity and proper lifting techniques.    PT Home Exercise Plan given           G-Codes - 12/23/14 1018    Functional Limitation Mobility: Walking and moving around   Mobility: Walking and Moving Around Current Status [phone]([id number]) At least 20 percent but less than 40 percent impaired, limited or restricted   Mobility: Walking and Moving Around Goal Status [phone](G8979) At least 20 percent but less than 40 percent impaired, limited or restricted       Problem List Patient Active Problem List   Diagnosis Date Noted  . BACK PAIN, LUMBAR 01/02/2008  Virgina Organynthia Jayvan Mcshan, PT CLT [phone] 12/23/2014, 10:19 AM  [place] [facility] [address] [place], [place], [address] Phone: [phone]   Fax:  [phone]

## 2015-01-07 ENCOUNTER — Encounter (HOSPITAL_COMMUNITY): Payer: Medicare Other | Admitting: Specialist

## 2015-01-07 ENCOUNTER — Encounter (HOSPITAL_COMMUNITY): Payer: Medicare Other

## 2015-01-14 ENCOUNTER — Encounter (HOSPITAL_COMMUNITY): Payer: Medicare Other

## 2015-01-14 ENCOUNTER — Encounter (HOSPITAL_COMMUNITY): Payer: Medicare Other | Admitting: Specialist

## 2015-01-15 ENCOUNTER — Encounter (HOSPITAL_COMMUNITY): Payer: Self-pay

## 2015-01-15 ENCOUNTER — Ambulatory Visit (HOSPITAL_COMMUNITY): Payer: Medicare Other | Attending: Orthopedic Surgery

## 2015-01-15 ENCOUNTER — Ambulatory Visit (HOSPITAL_COMMUNITY): Payer: Medicare Other

## 2015-01-15 DIAGNOSIS — R29898 Other symptoms and signs involving the musculoskeletal system: Secondary | ICD-10-CM

## 2015-01-15 DIAGNOSIS — M25511 Pain in right shoulder: Secondary | ICD-10-CM | POA: Diagnosis not present

## 2015-01-15 DIAGNOSIS — M25611 Stiffness of right shoulder, not elsewhere classified: Secondary | ICD-10-CM

## 2015-01-15 DIAGNOSIS — M629 Disorder of muscle, unspecified: Secondary | ICD-10-CM | POA: Insufficient documentation

## 2015-01-15 DIAGNOSIS — M75101 Unspecified rotator cuff tear or rupture of right shoulder, not specified as traumatic: Secondary | ICD-10-CM | POA: Insufficient documentation

## 2015-01-15 DIAGNOSIS — M5441 Lumbago with sciatica, right side: Secondary | ICD-10-CM

## 2015-01-15 DIAGNOSIS — M25811 Other specified joint disorders, right shoulder: Secondary | ICD-10-CM | POA: Insufficient documentation

## 2015-01-15 DIAGNOSIS — M6289 Other specified disorders of muscle: Secondary | ICD-10-CM

## 2015-01-15 DIAGNOSIS — R6889 Other general symptoms and signs: Secondary | ICD-10-CM

## 2015-01-15 NOTE — Therapy (Signed)
[place] [facility] [address] [place], [place], [address] Phone: [phone]   Fax:  [phone]  Physical Therapy Treatment  Patient Details  Name: Denise HoffMary L Dekker MRN: [id number] Date of Birth: 08/18/1960 Referring Provider:  [name], Stanley E, MD  Encounter Date: 01/15/2015      PT End of Session - 01/15/15 0819    Visit Number 2   Number of Visits 12   Date for PT Re-Evaluation 02/06/15   Authorization Type UHC/ Medicare   Authorization - Visit Number 2   Authorization - Number of Visits 10   PT Start Time 0807   PT Stop Time 0838   PT Time Calculation (min) 31 min   Activity Tolerance Patient tolerated treatment well   Behavior During Therapy [facility] for tasks assessed/performed      No past medical history on file.  No past surgical history on file.  There were no vitals filed for this visit.  Visit Diagnosis:  Midline low back pain with right-sided sciatica  Leg weakness, bilateral  Postural instability of trunk      Subjective Assessment - 01/15/15 0817    Subjective Pt stated compliance with HEP daily without questions, current pain scale 8/10 lower back and buttocks area.   Currently in Pain? Yes   Pain Score 8    Pain Location Back   Pain Orientation Lower   Pain Descriptors / Indicators Aching             [facility] Adult PT Treatment/Exercise - 01/15/15 0001    Bed Mobility   Bed Mobility Sit to Sidelying Left   Sit to Sidelying Left 7: Independent;Other (comment)  Educated on proper   Exercises   Exercises Lumbar   Lumbar Exercises: Seated   Other Seated Lumbar Exercises Educated on proper sitting posture with towel roll in lumbar region   Lumbar Exercises: Supine   Bent Knee Raise 10 reps;5 seconds   Bent Knee Raise Limitations cueing for stabilty   Bridge 10 reps   Straight Leg Raise 5 reps   Lumbar Exercises: Prone   Straight Leg Raise 10 reps   Straight Leg Raises Limitations pillow under hips    Other Prone Lumbar Exercises heel squeeze 5x 5" with pillow under hips             PT Short Term Goals - 01/15/15 [id number]    PT SHORT TERM GOAL #1   Title I HEP   Status On-going   PT SHORT TERM GOAL #2   Title Pt pain to be no greater than a 7/10 90% of the day    Status On-going   PT SHORT TERM GOAL #3   Title Pt rotation to be wnl to allow pt to look at blind side when driving a car    Status On-going           PT Long Term Goals - 01/15/15 [id number]    PT LONG TERM GOAL #1   Title Pt to be I in advance HEP   PT LONG TERM GOAL #2   Title Pt pain to be no greater than a 5/10 80% of the day    PT LONG TERM GOAL #3   Title Pt to be able to sit for 40 minutes with comfort to allow car riding and eating of meals in comfort    PT LONG TERM GOAL #4   Title Pt to be able to stand for 15 minutes to be able to  make a amall meal in comfort.   PT LONG TERM GOAL #5   Title Pt to be walking at least 15 minutes 4 times a week for better health habits                Plan - 01/15/15 0819    Clinical Impression Statement Began session reviewing goals, compliance with HEP and answering questions with exercises and copy of initial evaluation given to pt.  Pt educated on importance of good sitting posture and the use of towel roll for increased comfort in sitting position.  Pt educated on proper bed mobility getting in and out  or bed.  Began core strengthening exercises with cueing for form and to improve stabilty   PT Next Visit Plan Review importance of good sitting posture with use of towel roll while sitting.  Continue stabilization exercises and progress to standing activity and proper lifting techniques.  Begin 3D hip excursion next session to improve hip mobilty for increased rotation.          Problem List Patient Active Problem List   Diagnosis Date Noted  . BACK PAIN, LUMBAR 01/02/2008   Juel Burrow, PTA  Juel Burrow 01/15/2015, 8:42 AM  [facility] [address] [place], [place], [address] Phone: [phone]   Fax:  [phone]

## 2015-01-15 NOTE — Therapy (Signed)
[facility] [address] [place], [place], [address] Phone: [phone]   Fax:  [phone]  Occupational Therapy Treatment  Patient Details  Name: Linna HoffMary L Pinedo MRN: [id number] Date of Birth: 10/01/1960 Referring Provider:  [name], Stanley E, MD  Encounter Date: 01/15/2015      OT End of Session - 01/15/15 0942    Visit Number 2   Number of Visits 8   Date for OT Re-Evaluation 01/20/15   Authorization Type UHC Medicare primary Medicaid secondary   Authorization Time Period before 10th visit   Authorization - Visit Number 2   Authorization - Number of Visits 10   OT Start Time 0850   OT Stop Time 0933   OT Time Calculation (min) 43 min   Activity Tolerance Patient tolerated treatment well   Behavior During Therapy [facility] for tasks assessed/performed      History reviewed. No pertinent past medical history.  No past surgical history on file.  There were no vitals filed for this visit.  Visit Diagnosis:  Pain in joint, shoulder region, right  Weakness of right arm  Tight fascia  Decreased range of motion of shoulder, right      Subjective Assessment - 01/15/15 0911    Subjective  S: Oh, I thought this was my only appt.    Currently in Pain? Yes   Pain Score 5    Pain Location Shoulder   Pain Orientation Right   Pain Descriptors / Indicators Aching   Pain Type Acute pain            [facility] OT Assessment - 01/15/15 0914    Assessment   Diagnosis Right rotator cuff syndrome   Precautions   Precautions None                  OT Treatments/Exercises (OP) - 01/15/15 0914    Exercises   Exercises Shoulder   Shoulder Exercises: Supine   Protraction PROM;5 reps;AROM;12 reps   Horizontal ABduction PROM;5 reps;AROM;12 reps   External Rotation PROM;5 reps;AROM;12 reps   Internal Rotation PROM;5 reps;AROM;12 reps   Flexion PROM;5 reps;AROM;12 reps   ABduction PROM;5 reps;AROM;12 reps   Shoulder  Exercises: Standing   Protraction AROM;12 reps   Horizontal ABduction AROM;12 reps   External Rotation AROM;12 reps   Internal Rotation AROM;12 reps   Flexion AROM;12 reps   ABduction AROM;12 reps   Manual Therapy   Manual Therapy Myofascial release   Myofascial Release Myofascial release and manual stretching to right upper arm with focus on subscapularis region to decrease fascial restrctions and increase joint mobility in a pain free zone.                   OT Short Term Goals - 01/15/15 0947    OT SHORT TERM GOAL #1   Title Patient will be educated and independent with HEP.   Status On-going   OT SHORT TERM GOAL #2   Title Patient will decrease pain level to 5/10 or less after completing lawn mowing.    Status On-going   OT SHORT TERM GOAL #3   Title Patient will increase scapular stability and posture by 75%.   Status On-going   OT SHORT TERM GOAL #4   Title Patient increase RUE shoulder strength to 5/5.   Status On-going   OT SHORT TERM GOAL #5   Title Patient will decrease fascial restrctions and trigger points to trace amount.    Status On-going  Plan - 01/15/15 0942    Clinical Impression Statement A: Patient given print out of evaluation. Higlighted goals. As patient was leaving, therapist reminded her of her next scheduled appt. Patient was surprised and said she thought this was her only scheduled appt. Patient is scheduled through 4 weeks in EPIC. Informed patient that typically with shoulder pain she shouldn't expect to see any significant improvement until 4 weeks of therapy. pt then stated that she wasn't able to afford to come every week because of gas. Recommend that patient then change her schedule to once every other week and to stop at the front desk. Patient agreed but left without stopping at the desk.    Plan P: Unsure if patient is going to continue with therapy or not. May seem more practical to discharge to HEP if she is  unable to complete 4 weeks of therapy. Discuss this with patient and see what she'd like to do. Add 1# weight to supine and standing exercises if able to tolerate. Add scapular theraband exercises.        Problem List Patient Active Problem List   Diagnosis Date Noted  . BACK PAIN, LUMBAR 01/02/2008    Limmie Patricia, OTR/L,CBIS  [phone]  01/15/2015, 9:49 AM  [place] [facility] [address] [place], [place], [address] Phone: [phone]   Fax:  [phone]

## 2015-01-21 ENCOUNTER — Encounter (HOSPITAL_COMMUNITY): Payer: Medicare Other | Admitting: Physical Therapy

## 2015-01-21 ENCOUNTER — Encounter (HOSPITAL_COMMUNITY): Payer: Medicare Other | Admitting: Occupational Therapy

## 2015-01-28 ENCOUNTER — Ambulatory Visit (HOSPITAL_COMMUNITY): Payer: Medicare Other | Admitting: Physical Therapy

## 2015-01-28 ENCOUNTER — Encounter (HOSPITAL_COMMUNITY): Payer: Medicare Other

## 2015-01-28 ENCOUNTER — Ambulatory Visit (HOSPITAL_COMMUNITY): Payer: Medicare Other

## 2015-02-04 ENCOUNTER — Ambulatory Visit (HOSPITAL_COMMUNITY): Payer: Medicare Other

## 2015-02-04 ENCOUNTER — Encounter (HOSPITAL_COMMUNITY): Payer: Medicare Other

## 2015-02-11 ENCOUNTER — Ambulatory Visit (HOSPITAL_COMMUNITY): Payer: Medicare Other

## 2015-02-11 ENCOUNTER — Encounter (HOSPITAL_COMMUNITY): Payer: Medicare Other

## 2015-02-11 ENCOUNTER — Encounter (HOSPITAL_COMMUNITY): Payer: Self-pay

## 2015-02-11 NOTE — Therapy (Signed)
[place], [place], [address] Phone: [phone]   Fax:  [phone]  Patient Details  Name: Denise Rivas MRN: [id number] Date of Birth: Aug 03, 1961 Referring Provider:  Carole Civil, MD  Encounter Date: 12/23/2014   PHYSICAL THERAPY DISCHARGE SUMMARY  Visits from Start of Care:1  Current functional level related to goals / functional outcomes: Unknown pt never returned   Remaining deficits: Assume the same   Education / Equipment: HEP  Plan: Patient agrees to discharge.  Patient goals were not met. Patient is being discharged due to not returning since the last visit.  ?????    Rayetta Humphrey, PT CLT [id number] 02/11/2015, 10:57 AM  [place], [place], [address] Phone: [phone]   Fax:  [phone]

## 2015-02-11 NOTE — Therapy (Signed)
[facility] [address] [place], [place], [address] Phone: [phone]   Fax:  [phone]  Patient Details  Name: Denise Rivas MRN: [id number] Date of Birth: 06/17/1961 Referring Provider:  No ref. provider found  Encounter Date: 02/11/2015  OCCUPATIONAL THERAPY DISCHARGE SUMMARY  Visits from Start of Care: 2  Current functional level related to goals / functional outcomes: OT SHORT TERM GOAL #1    Title Patient will be educated and independent with HEP.   Status On-going   OT SHORT TERM GOAL #2   Title Patient will decrease pain level to 5/10 or less after completing lawn mowing.    Status On-going   OT SHORT TERM GOAL #3   Title Patient will increase scapular stability and posture by 75%.   Status On-going   OT SHORT TERM GOAL #4   Title Patient increase RUE shoulder strength to 5/5.   Status On-going   OT SHORT TERM GOAL #5   Title Patient will decrease fascial restrctions and trigger points to trace amount.    Status On-going       Pt called and stated she had an appt with Dr. Dondiego who told her she did not have to return to therapy.    Remaining deficits: Pt has not met any therapy goals. Patient continues to have increase pain, fascial restrictions and decreased strength and scapular stability.    Education / Equipment: Shoulder stretches and theraband scapular strengthening. Plan: Patient agrees to discharge.  Patient goals were not met. Patient is being discharged due to the physician's request.  ?????       Laura Essenmacher, OTR/L,CBIS  [phone]  02/11/2015, 8:15 AM  [place] [address] [place], , [address] Phone: [phone]   Fax:  [phone] 

## 2018-08-30 ENCOUNTER — Other Ambulatory Visit (HOSPITAL_COMMUNITY): Payer: Self-pay | Admitting: Family Medicine

## 2018-08-30 DIAGNOSIS — Z1231 Encounter for screening mammogram for malignant neoplasm of breast: Secondary | ICD-10-CM

## 2018-09-10 ENCOUNTER — Ambulatory Visit (HOSPITAL_COMMUNITY)
Admission: RE | Admit: 2018-09-10 | Discharge: 2018-09-10 | Disposition: A | Discharge status: Home / Self Care | Payer: Medicare Other | Source: Ambulatory Visit | Attending: Family Medicine | Admitting: Family Medicine

## 2018-09-10 ENCOUNTER — Encounter (HOSPITAL_COMMUNITY): Payer: Self-pay

## 2018-09-10 DIAGNOSIS — Z1231 Encounter for screening mammogram for malignant neoplasm of breast: Secondary | ICD-10-CM | POA: Insufficient documentation

## 2018-11-01 ENCOUNTER — Encounter: Payer: Self-pay | Admitting: Gastroenterology

## 2019-02-07 ENCOUNTER — Ambulatory Visit: Payer: Medicare Other | Admitting: Nurse Practitioner

## 2019-03-25 ENCOUNTER — Ambulatory Visit: Payer: Medicare Other | Admitting: Nurse Practitioner

## 2019-10-29 ENCOUNTER — Other Ambulatory Visit (HOSPITAL_COMMUNITY): Payer: Self-pay | Admitting: Family Medicine

## 2019-10-29 DIAGNOSIS — Z1231 Encounter for screening mammogram for malignant neoplasm of breast: Secondary | ICD-10-CM

## 2019-11-08 ENCOUNTER — Ambulatory Visit (HOSPITAL_COMMUNITY)
Admission: RE | Admit: 2019-11-08 | Discharge: 2019-11-08 | Disposition: A | Discharge status: Home / Self Care | Payer: Medicare Other | Source: Ambulatory Visit | Attending: Family Medicine | Admitting: Family Medicine

## 2019-11-08 ENCOUNTER — Other Ambulatory Visit (HOSPITAL_COMMUNITY)
Admission: RE | Admit: 2019-11-08 | Discharge: 2019-11-08 | Disposition: A | Discharge status: Home / Self Care | Payer: Medicare Other | Source: Ambulatory Visit | Attending: Family Medicine | Admitting: Family Medicine

## 2019-11-08 ENCOUNTER — Other Ambulatory Visit: Payer: Self-pay

## 2019-11-08 DIAGNOSIS — E559 Vitamin D deficiency, unspecified: Secondary | ICD-10-CM | POA: Diagnosis not present

## 2019-11-08 DIAGNOSIS — I110 Hypertensive heart disease with heart failure: Secondary | ICD-10-CM | POA: Insufficient documentation

## 2019-11-08 DIAGNOSIS — Z1231 Encounter for screening mammogram for malignant neoplasm of breast: Secondary | ICD-10-CM | POA: Diagnosis not present

## 2019-11-08 DIAGNOSIS — R5383 Other fatigue: Secondary | ICD-10-CM | POA: Insufficient documentation

## 2019-11-08 DIAGNOSIS — D510 Vitamin B12 deficiency anemia due to intrinsic factor deficiency: Secondary | ICD-10-CM | POA: Insufficient documentation

## 2019-11-08 DIAGNOSIS — E7849 Other hyperlipidemia: Secondary | ICD-10-CM | POA: Diagnosis not present

## 2019-11-08 LAB — COMPREHENSIVE METABOLIC PANEL
ALT: 19 U/L (ref 0–44)
AST: 19 U/L (ref 15–41)
Albumin: 3.7 g/dL (ref 3.5–5.0)
Alkaline Phosphatase: 100 U/L (ref 38–126)
Anion gap: 7 (ref 5–15)
BUN: 20 mg/dL (ref 6–20)
CO2: 26 mmol/L (ref 22–32)
Calcium: 9.2 mg/dL (ref 8.9–10.3)
Chloride: 106 mmol/L (ref 98–111)
Creatinine, Ser: 0.58 mg/dL (ref 0.44–1.00)
GFR calc Af Amer: >60 mL/min (ref >60)
GFR calc non Af Amer: >60 mL/min (ref >60)
Glucose, Bld: 103 mg/dL — ABNORMAL HIGH (ref 70–99)
Potassium: 4 mmol/L (ref 3.5–5.1)
Sodium: 139 mmol/L (ref 135–145)
Total Bilirubin: 0.5 mg/dL (ref 0.3–1.2)
Total Protein: 7.3 g/dL (ref 6.5–8.1)

## 2019-11-08 LAB — CBC
HCT: 43.2 % (ref 36.0–46.0)
Hemoglobin: 14.1 g/dL (ref 12.0–15.0)
MCH: 31.6 pg (ref 26.0–34.0)
MCHC: 32.6 g/dL (ref 30.0–36.0)
MCV: 96.9 fL (ref 80.0–100.0)
Platelets: 272 10*3/uL (ref 150–400)
RBC: 4.46 MIL/uL (ref 3.87–5.11)
RDW: 13.5 % (ref 11.5–15.5)
WBC: 6.7 10*3/uL (ref 4.0–10.5)
nRBC: 0 % (ref 0.0–0.2)

## 2019-11-08 LAB — LIPID PANEL
Cholesterol: 210 mg/dL — ABNORMAL HIGH (ref 0–200)
HDL: 70 mg/dL (ref >40)
LDL Cholesterol: 126 mg/dL — ABNORMAL HIGH (ref 0–99)
Total CHOL/HDL Ratio: 3 RATIO
Triglycerides: 70 mg/dL (ref <150)
VLDL: 14 mg/dL (ref 0–40)

## 2019-11-08 LAB — TSH: TSH: 0.803 u[IU]/mL (ref 0.350–4.500)

## 2019-11-08 LAB — VITAMIN D 25 HYDROXY (VIT D DEFICIENCY, FRACTURES): Vit D, 25-Hydroxy: 43.35 ng/mL (ref 30–100)

## 2020-02-18 ENCOUNTER — Other Ambulatory Visit (HOSPITAL_COMMUNITY)
Admission: RE | Admit: 2020-02-18 | Discharge: 2020-02-18 | Disposition: A | Discharge status: Home / Self Care | Payer: Medicare Other | Source: Ambulatory Visit | Attending: Internal Medicine | Admitting: Internal Medicine

## 2020-02-18 ENCOUNTER — Other Ambulatory Visit: Payer: Self-pay

## 2020-02-18 DIAGNOSIS — I10 Essential (primary) hypertension: Secondary | ICD-10-CM | POA: Diagnosis present

## 2020-02-18 DIAGNOSIS — R5383 Other fatigue: Secondary | ICD-10-CM | POA: Insufficient documentation

## 2020-02-18 DIAGNOSIS — E559 Vitamin D deficiency, unspecified: Secondary | ICD-10-CM | POA: Diagnosis not present

## 2020-02-18 DIAGNOSIS — E7849 Other hyperlipidemia: Secondary | ICD-10-CM | POA: Diagnosis not present

## 2020-02-18 DIAGNOSIS — D510 Vitamin B12 deficiency anemia due to intrinsic factor deficiency: Secondary | ICD-10-CM | POA: Diagnosis not present

## 2020-02-18 LAB — COMPREHENSIVE METABOLIC PANEL
ALT: 13 U/L (ref 0–44)
AST: 14 U/L — ABNORMAL LOW (ref 15–41)
Albumin: 3.9 g/dL (ref 3.5–5.0)
Alkaline Phosphatase: 99 U/L (ref 38–126)
Anion gap: 9 (ref 5–15)
BUN: 20 mg/dL (ref 6–20)
CO2: 25 mmol/L (ref 22–32)
Calcium: 9.3 mg/dL (ref 8.9–10.3)
Chloride: 107 mmol/L (ref 98–111)
Creatinine, Ser: 0.58 mg/dL (ref 0.44–1.00)
GFR calc Af Amer: >60 mL/min (ref >60)
GFR calc non Af Amer: >60 mL/min (ref >60)
Glucose, Bld: 100 mg/dL — ABNORMAL HIGH (ref 70–99)
Potassium: 4 mmol/L (ref 3.5–5.1)
Sodium: 141 mmol/L (ref 135–145)
Total Bilirubin: 0.5 mg/dL (ref 0.3–1.2)
Total Protein: 7.4 g/dL (ref 6.5–8.1)

## 2020-02-18 LAB — CBC
HCT: 44.1 % (ref 36.0–46.0)
Hemoglobin: 14.1 g/dL (ref 12.0–15.0)
MCH: 30.6 pg (ref 26.0–34.0)
MCHC: 32 g/dL (ref 30.0–36.0)
MCV: 95.7 fL (ref 80.0–100.0)
Platelets: 278 10*3/uL (ref 150–400)
RBC: 4.61 MIL/uL (ref 3.87–5.11)
RDW: 12.8 % (ref 11.5–15.5)
WBC: 7.5 10*3/uL (ref 4.0–10.5)
nRBC: 0 % (ref 0.0–0.2)

## 2020-02-18 LAB — VITAMIN D 25 HYDROXY (VIT D DEFICIENCY, FRACTURES): Vit D, 25-Hydroxy: 43.41 ng/mL (ref 30–100)

## 2020-02-18 LAB — LIPID PANEL
Cholesterol: 181 mg/dL (ref 0–200)
HDL: 51 mg/dL (ref >40)
LDL Cholesterol: 111 mg/dL — ABNORMAL HIGH (ref 0–99)
Total CHOL/HDL Ratio: 3.5 RATIO
Triglycerides: 95 mg/dL (ref <150)
VLDL: 19 mg/dL (ref 0–40)

## 2020-02-18 LAB — TSH: TSH: 0.696 u[IU]/mL (ref 0.350–4.500)

## 2020-09-01 IMAGING — MG DIGITAL SCREENING BILAT W/ TOMO W/ CAD
8 series · 8 of 24 positions shown · non-contrast
Comparison: Previous exam(s).

CLINICAL DATA: Screening.

EXAM:
DIGITAL SCREENING BILATERAL MAMMOGRAM WITH TOMO AND CAD

[L MLO synth-2D]
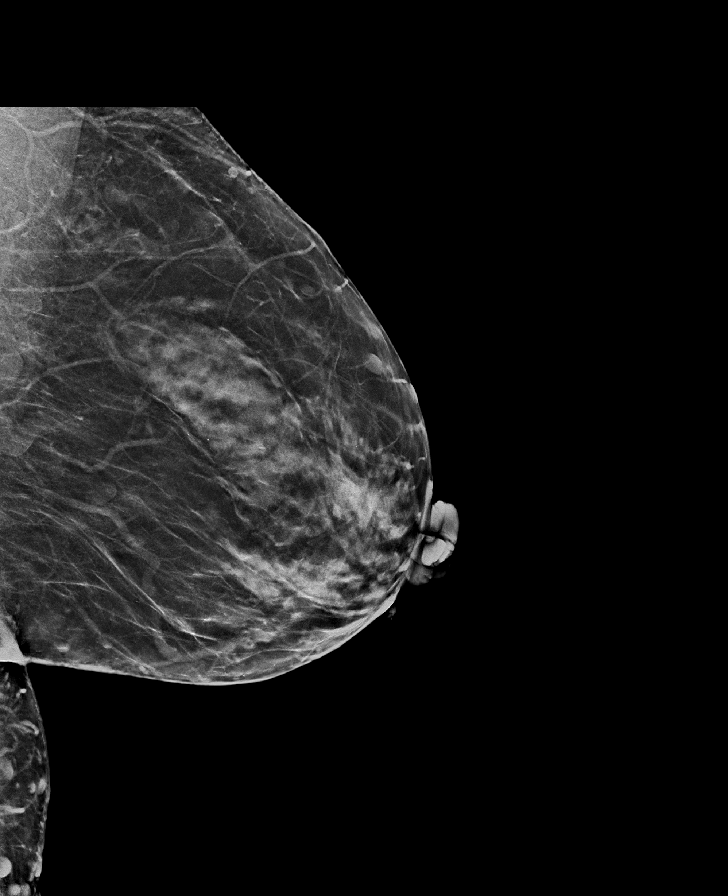

[R MLO synth-2D]
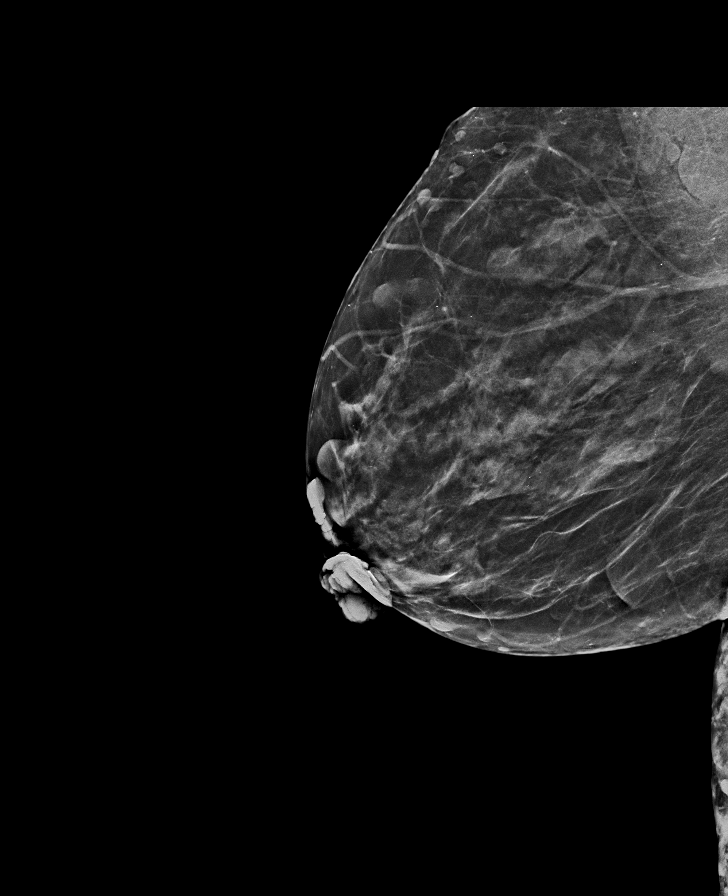

[L CC synth-2D]
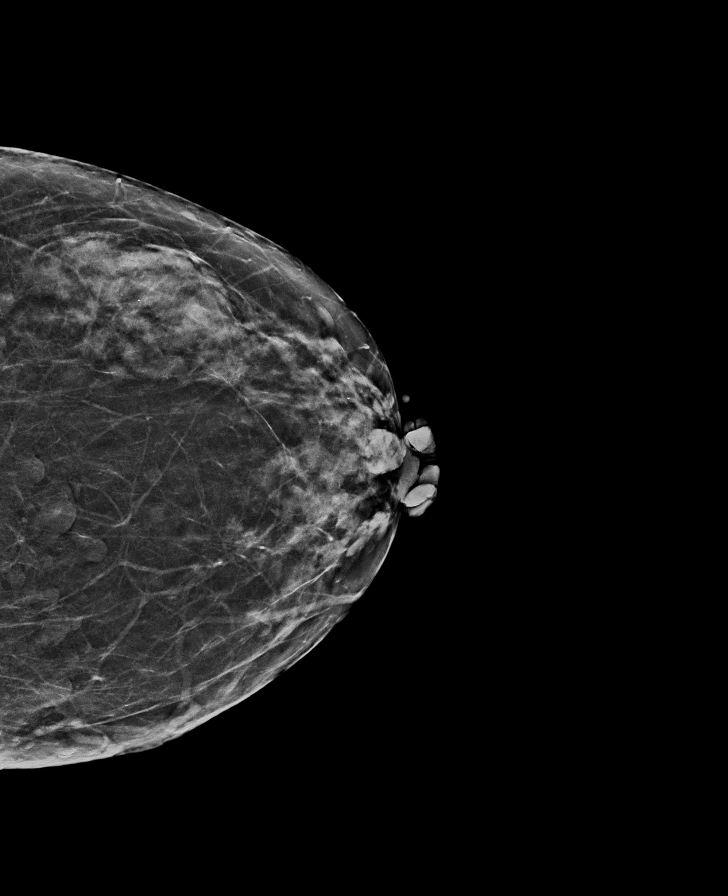

[R CC synth-2D]
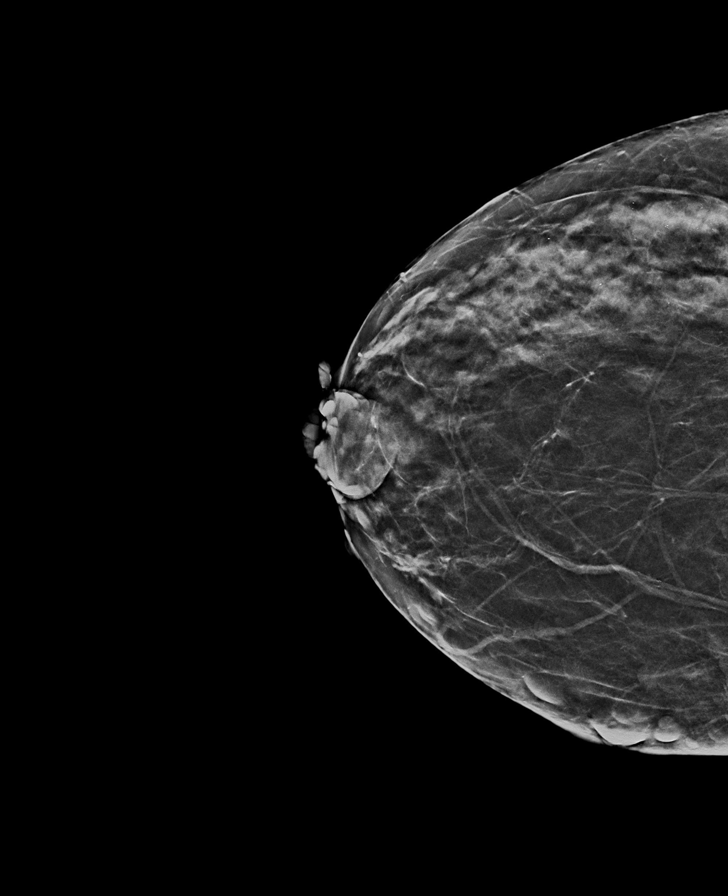

[R CC tomo · tomo slice 27/53.0]
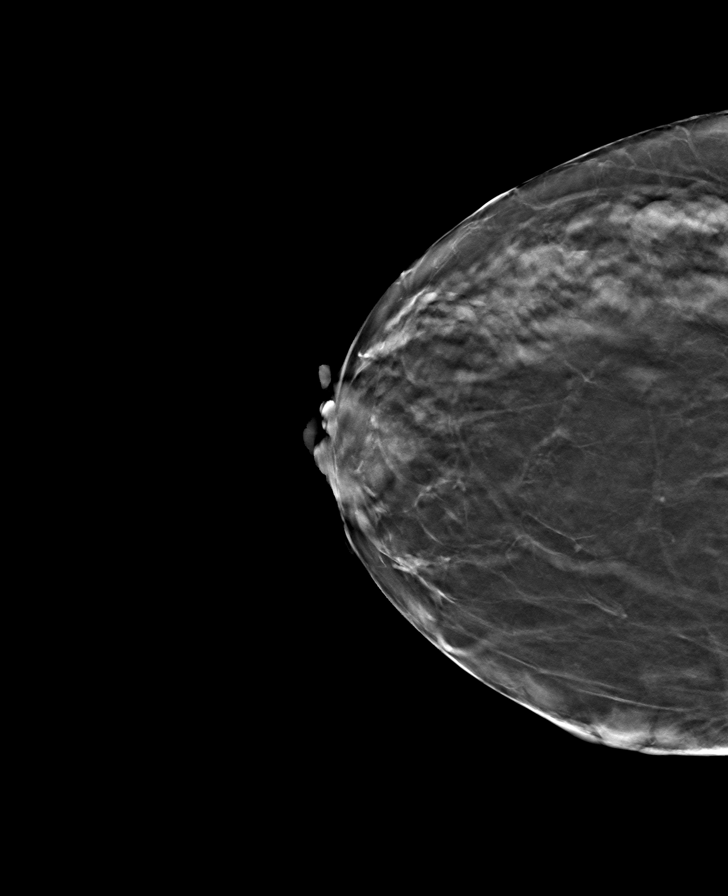

[R MLO tomo · tomo slice 30/59.0]
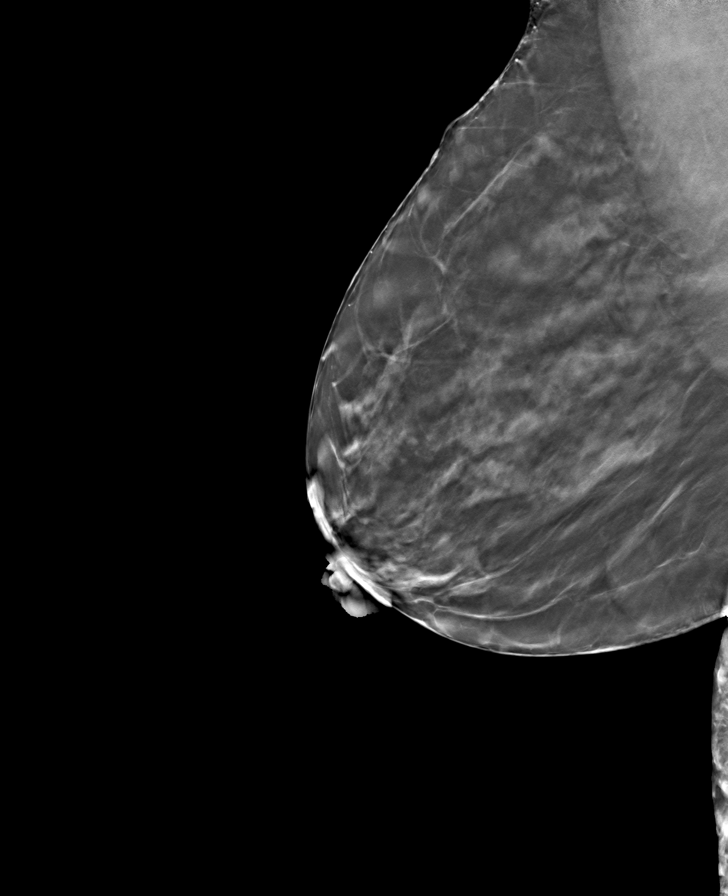

[L MLO tomo · tomo slice 31/62.0]
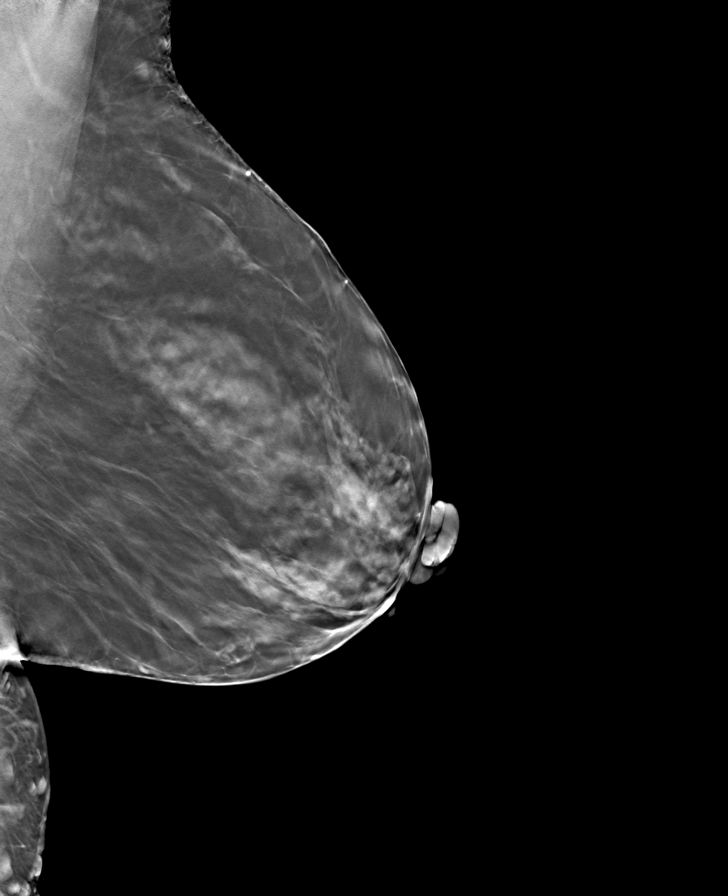

[L CC tomo · tomo slice 29/57.0]
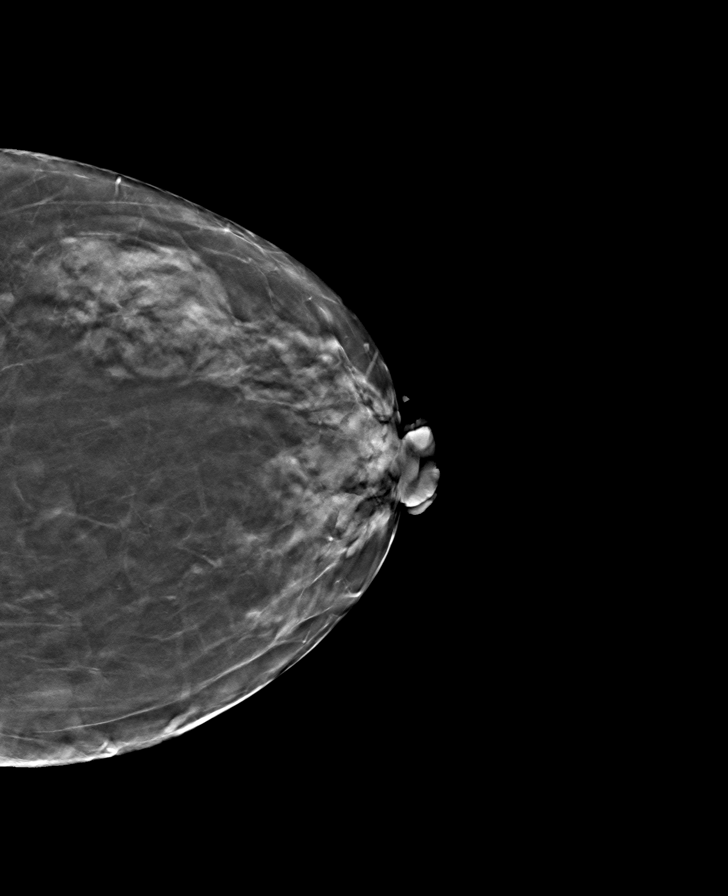

[8 of 24 positions shown; findings below may reference images not displayed]

ACR Breast Density Category c: The breast tissue is heterogeneously
dense, which may obscure small masses.
FINDINGS: There are no findings suspicious for malignancy. Images were
processed with CAD.
IMPRESSION: No mammographic evidence of malignancy. A result letter of this
screening mammogram will be mailed directly to the patient.

RECOMMENDATION:
Screening mammogram in one year. (Code:[id number]-U-LHB)

BI-RADS CATEGORY  1: Negative.

## 2020-10-26 ENCOUNTER — Other Ambulatory Visit: Payer: Self-pay

## 2020-10-26 ENCOUNTER — Other Ambulatory Visit (HOSPITAL_COMMUNITY)
Admission: RE | Admit: 2020-10-26 | Discharge: 2020-10-26 | Disposition: A | Discharge status: Home / Self Care | Payer: Medicare Other | Source: Ambulatory Visit | Attending: Internal Medicine | Admitting: Internal Medicine

## 2020-10-26 DIAGNOSIS — E7849 Other hyperlipidemia: Secondary | ICD-10-CM | POA: Diagnosis present

## 2020-10-26 DIAGNOSIS — E559 Vitamin D deficiency, unspecified: Secondary | ICD-10-CM | POA: Diagnosis present

## 2020-10-26 DIAGNOSIS — D510 Vitamin B12 deficiency anemia due to intrinsic factor deficiency: Secondary | ICD-10-CM | POA: Diagnosis present

## 2020-10-26 LAB — CBC
HCT: 44.7 % (ref 36.0–46.0)
Hemoglobin: 14.5 g/dL (ref 12.0–15.0)
MCH: 31 pg (ref 26.0–34.0)
MCHC: 32.4 g/dL (ref 30.0–36.0)
MCV: 95.5 fL (ref 80.0–100.0)
Platelets: 317 10*3/uL (ref 150–400)
RBC: 4.68 MIL/uL (ref 3.87–5.11)
RDW: 13.3 % (ref 11.5–15.5)
WBC: 7.1 10*3/uL (ref 4.0–10.5)
nRBC: 0 % (ref 0.0–0.2)

## 2020-10-26 LAB — COMPREHENSIVE METABOLIC PANEL
ALT: 14 U/L (ref 0–44)
AST: 14 U/L — ABNORMAL LOW (ref 15–41)
Albumin: 3.7 g/dL (ref 3.5–5.0)
Alkaline Phosphatase: 101 U/L (ref 38–126)
Anion gap: 9 (ref 5–15)
BUN: 19 mg/dL (ref 6–20)
CO2: 23 mmol/L (ref 22–32)
Calcium: 9.3 mg/dL (ref 8.9–10.3)
Chloride: 105 mmol/L (ref 98–111)
Creatinine, Ser: 0.57 mg/dL (ref 0.44–1.00)
GFR, Estimated: >60 mL/min (ref >60)
Glucose, Bld: 97 mg/dL (ref 70–99)
Potassium: 4.2 mmol/L (ref 3.5–5.1)
Sodium: 137 mmol/L (ref 135–145)
Total Bilirubin: 0.4 mg/dL (ref 0.3–1.2)
Total Protein: 7.6 g/dL (ref 6.5–8.1)

## 2020-10-26 LAB — VITAMIN D 25 HYDROXY (VIT D DEFICIENCY, FRACTURES): Vit D, 25-Hydroxy: 68.76 ng/mL (ref 30–100)

## 2020-10-26 LAB — LIPID PANEL
Cholesterol: 191 mg/dL (ref 0–200)
HDL: 54 mg/dL (ref >40)
LDL Cholesterol: 117 mg/dL — ABNORMAL HIGH (ref 0–99)
Total CHOL/HDL Ratio: 3.5 RATIO
Triglycerides: 98 mg/dL (ref <150)
VLDL: 20 mg/dL (ref 0–40)

## 2020-10-26 LAB — TSH: TSH: 0.567 u[IU]/mL (ref 0.350–4.500)

## 2021-08-03 ENCOUNTER — Encounter: Payer: Self-pay | Admitting: *Deleted

## 2021-09-20 ENCOUNTER — Ambulatory Visit: Payer: Medicare Other

## 2022-07-14 ENCOUNTER — Other Ambulatory Visit (HOSPITAL_COMMUNITY): Payer: Self-pay | Admitting: Family Medicine

## 2022-07-14 ENCOUNTER — Encounter (HOSPITAL_COMMUNITY): Payer: Self-pay | Admitting: Family Medicine

## 2022-07-14 ENCOUNTER — Inpatient Hospital Stay
Admission: RE | Admit: 2022-07-14 | Discharge: 2022-07-14 | Disposition: A | Discharge status: Home / Self Care | Payer: Self-pay | Source: Ambulatory Visit | Attending: Family Medicine | Admitting: Family Medicine

## 2022-07-14 DIAGNOSIS — Z1231 Encounter for screening mammogram for malignant neoplasm of breast: Secondary | ICD-10-CM

## 2022-07-14 DIAGNOSIS — R928 Other abnormal and inconclusive findings on diagnostic imaging of breast: Secondary | ICD-10-CM

## 2022-07-28 ENCOUNTER — Ambulatory Visit (HOSPITAL_COMMUNITY)
Admission: RE | Admit: 2022-07-28 | Discharge: 2022-07-28 | Disposition: A | Discharge status: Home / Self Care | Payer: Medicare Other | Source: Ambulatory Visit | Attending: Family Medicine | Admitting: Family Medicine

## 2022-07-28 DIAGNOSIS — R928 Other abnormal and inconclusive findings on diagnostic imaging of breast: Secondary | ICD-10-CM | POA: Insufficient documentation

## 2022-08-03 LAB — EXTERNAL GENERIC LAB PROCEDURE

## 2022-08-03 LAB — COLOGUARD

## 2022-08-31 LAB — EXTERNAL GENERIC LAB PROCEDURE

## 2022-08-31 LAB — COLOGUARD

## 2022-12-01 ENCOUNTER — Other Ambulatory Visit (HOSPITAL_COMMUNITY): Payer: Self-pay | Admitting: Family Medicine

## 2022-12-01 DIAGNOSIS — M545 Low back pain, unspecified: Secondary | ICD-10-CM

## 2022-12-30 LAB — COLOGUARD

## 2022-12-30 LAB — EXTERNAL GENERIC LAB PROCEDURE

## 2023-01-05 ENCOUNTER — Ambulatory Visit (HOSPITAL_COMMUNITY)
Admission: RE | Admit: 2023-01-05 | Discharge: 2023-01-05 | Disposition: A | Discharge status: Home / Self Care | Payer: 59 | Source: Ambulatory Visit | Attending: Family Medicine | Admitting: Family Medicine

## 2023-01-05 DIAGNOSIS — M545 Low back pain, unspecified: Secondary | ICD-10-CM | POA: Diagnosis present

## 2023-01-19 LAB — COLOGUARD

## 2023-01-19 LAB — EXTERNAL GENERIC LAB PROCEDURE

## 2023-05-31 ENCOUNTER — Other Ambulatory Visit (HOSPITAL_COMMUNITY): Payer: Self-pay | Admitting: Nurse Practitioner

## 2023-05-31 ENCOUNTER — Encounter (HOSPITAL_COMMUNITY): Payer: Self-pay | Admitting: Nurse Practitioner

## 2023-05-31 DIAGNOSIS — Z1231 Encounter for screening mammogram for malignant neoplasm of breast: Secondary | ICD-10-CM

## 2023-07-23 ENCOUNTER — Encounter (HOSPITAL_COMMUNITY): Payer: Self-pay | Admitting: Radiology

## 2023-07-23 ENCOUNTER — Emergency Department (HOSPITAL_COMMUNITY)
Admission: EM | Admit: 2023-07-23 | Discharge: 2023-07-23 | Disposition: A | Discharge status: Home / Self Care | Payer: 59 | Attending: Emergency Medicine | Admitting: Emergency Medicine

## 2023-07-23 ENCOUNTER — Emergency Department (HOSPITAL_COMMUNITY): Payer: 59

## 2023-07-23 DIAGNOSIS — Z79899 Other long term (current) drug therapy: Secondary | ICD-10-CM | POA: Diagnosis not present

## 2023-07-23 DIAGNOSIS — I10 Essential (primary) hypertension: Secondary | ICD-10-CM | POA: Insufficient documentation

## 2023-07-23 DIAGNOSIS — R109 Unspecified abdominal pain: Secondary | ICD-10-CM | POA: Diagnosis not present

## 2023-07-23 DIAGNOSIS — R0789 Other chest pain: Secondary | ICD-10-CM | POA: Diagnosis present

## 2023-07-23 DIAGNOSIS — R918 Other nonspecific abnormal finding of lung field: Secondary | ICD-10-CM | POA: Diagnosis not present

## 2023-07-23 LAB — BASIC METABOLIC PANEL
Anion gap: 12 (ref 5–15)
BUN: 11 mg/dL (ref 8–23)
CO2: 24 mmol/L (ref 22–32)
Calcium: 10.2 mg/dL (ref 8.9–10.3)
Chloride: 103 mmol/L (ref 98–111)
Creatinine, Ser: 0.57 mg/dL (ref 0.44–1.00)
GFR, Estimated: >60 mL/min (ref >60)
Glucose, Bld: 111 mg/dL — ABNORMAL HIGH (ref 70–99)
Potassium: 4 mmol/L (ref 3.5–5.1)
Sodium: 139 mmol/L (ref 135–145)

## 2023-07-23 LAB — CBC
HCT: 38.8 % (ref 36.0–46.0)
Hemoglobin: 12.5 g/dL (ref 12.0–15.0)
MCH: 29.1 pg (ref 26.0–34.0)
MCHC: 32.2 g/dL (ref 30.0–36.0)
MCV: 90.2 fL (ref 80.0–100.0)
Platelets: 422 10*3/uL — ABNORMAL HIGH (ref 150–400)
RBC: 4.3 MIL/uL (ref 3.87–5.11)
RDW: 14.9 % (ref 11.5–15.5)
WBC: 10.6 10*3/uL — ABNORMAL HIGH (ref 4.0–10.5)
nRBC: 0 % (ref 0.0–0.2)

## 2023-07-23 LAB — D-DIMER, QUANTITATIVE: D-Dimer, Quant: 0.42 ug{FEU}/mL (ref 0.00–0.50)

## 2023-07-23 LAB — TROPONIN I (HIGH SENSITIVITY)
Troponin I (High Sensitivity): <2 ng/L (ref <18)
Troponin I (High Sensitivity): <2 ng/L (ref <18)

## 2023-07-23 MED ORDER — KETOROLAC TROMETHAMINE 30 MG/ML IJ SOLN
30.0000 mg | Freq: Once | INTRAMUSCULAR | Status: AC
  Administered 2023-07-23: 30 mg via INTRAVENOUS
  Filled 2023-07-23: qty 1

## 2023-07-23 MED ORDER — IOHEXOL 300 MG/ML  SOLN
75.0000 mL | Freq: Once | INTRAMUSCULAR | Status: AC | PRN
  Administered 2023-07-23: 75 mL via INTRAVENOUS

## 2023-07-23 MED ORDER — HYDROCODONE-ACETAMINOPHEN 5-325 MG PO TABS: 1.0000 | ORAL_TABLET | ORAL | 0 refills | Status: DC | PRN

## 2023-07-23 NOTE — ED Triage Notes (Signed)
Pt states she has had chest pain for "few weeks" over left side and into shoulder blade. No n/d/v

## 2023-07-23 NOTE — ED Provider Notes (Signed)
[place] EMERGENCY DEPARTMENT AT [facility] Provider Note   CSN: [id number] Arrival date & time: 07/23/23  2440     History  Chief Complaint  Patient presents with   Chest Pain    Denise Rivas is a 62 y.o. female.  Pt is a 62 yo female with pmhx significant for neurofibromatosis type 1, HTN, HLD, and depression.  Pt has been having pain in her upper left chest for a few weeks.  She said most of her pain is in her left axilla and radiates to her shoulder blade.  No other associated sx.  Last mammogram in epic was 07/28/22 was nl.  Pt has another one scheduled for next week. Last cologuard was 07/15/22, but there was insufficient stool to perform test.  She said she was not aware of this.       Home Medications Prior to Admission medications   Medication Sig Start Date End Date Taking? Authorizing Provider  HYDROcodone-acetaminophen (NORCO/VICODIN) 5-325 MG tablet Take 1 tablet by mouth every 4 (four) hours as needed. 07/23/23  Yes Jacalyn Lefevre, MD  amLODipine (NORVASC) 5 MG tablet Take 5 mg by mouth daily.    [provider]  citalopram (CELEXA) 20 MG tablet Take 20 mg by mouth daily.    [provider]  diclofenac (CATAFLAM) 50 MG tablet Take 1 tablet (50 mg total) by mouth 2 (two) times daily. 12/09/14   Vickki Hearing, MD  gabapentin (NEURONTIN) 300 MG capsule Take 300 mg by mouth at bedtime.    [provider]  naproxen (NAPROSYN) 500 MG tablet Take 500 mg by mouth 2 (two) times daily with a meal.    [provider]      Allergies    Patient has no allergy information on record.    Review of Systems   Review of Systems  Cardiovascular:  Positive for chest pain.  All other systems reviewed and are negative.   Physical Exam Updated Vital Signs BP 132/85   Pulse (!) 102   Temp 98.4 F (36.9 C) (Oral)   Resp 18   Ht 5\' 6"  (1.676 m)   Wt 63.5 kg   SpO2 90%   BMI 22.60 kg/m  Physical Exam Vitals and  nursing note reviewed.  Constitutional:      Appearance: She is well-developed.  HENT:     Head: Normocephalic and atraumatic.  Eyes:     Extraocular Movements: Extraocular movements intact.     Pupils: Pupils are equal, round, and reactive to light.  Cardiovascular:     Rate and Rhythm: Normal rate and regular rhythm.     Heart sounds: Normal heart sounds.  Pulmonary:     Effort: Pulmonary effort is normal.     Breath sounds: Normal breath sounds.  Chest:     Comments: Tenderness to palpation left axilla.  No lad noted Abdominal:     General: Bowel sounds are normal.     Palpations: Abdomen is soft.  Musculoskeletal:        General: Normal range of motion.     Cervical back: Normal range of motion and neck supple.  Skin:    General: Skin is warm.     Capillary Refill: Capillary refill takes less than 2 seconds.     Comments: Multiple neurofibromas noted  Neurological:     General: No focal deficit present.     Mental Status: She is alert and oriented to person, place, and time.  Psychiatric:  Mood and Affect: Mood normal.        Behavior: Behavior normal.     ED Results / Procedures / Treatments   Labs (all labs ordered are listed, but only abnormal results are displayed) Labs Reviewed  BASIC METABOLIC PANEL - Abnormal; Notable for the following components:      Result Value   Glucose, Bld 111 (*)    All other components within normal limits  CBC - Abnormal; Notable for the following components:   WBC 10.6 (*)    Platelets 422 (*)    All other components within normal limits  D-DIMER, QUANTITATIVE  TROPONIN I (HIGH SENSITIVITY)  TROPONIN I (HIGH SENSITIVITY)    EKG EKG Interpretation Date/Time:  Sunday July 23 2023 08:34:55 EST Ventricular Rate:  117 PR Interval:  149 QRS Duration:  80 QT Interval:  333 QTC Calculation: 465 R Axis:   84  Text Interpretation: Sinus tachycardia Right atrial enlargement Borderline right axis deviation Probable  anteroseptal infarct, old No old tracing to compare Confirmed by Jacalyn Lefevre [phone]) on 07/23/2023 8:49:55 AM  Radiology CT Chest W Contrast  Result Date: 07/23/2023 CLINICAL DATA:  Abnormal chest x-ray, left upper lobe mass EXAM: CT CHEST WITH CONTRAST TECHNIQUE: Multidetector CT imaging of the chest was performed during intravenous contrast administration. RADIATION DOSE REDUCTION: This exam was performed according to the departmental dose-optimization program which includes automated exposure control, adjustment of the mA and/or kV according to patient size and/or use of iterative reconstruction technique. CONTRAST:  75mL OMNIPAQUE IOHEXOL 300 MG/ML  SOLN COMPARISON:  07/23/2023 FINDINGS: Cardiovascular: The heart is unremarkable without pericardial effusion. No evidence of thoracic aortic aneurysm or dissection. Atherosclerosis of the aorta and coronary vasculature. Mediastinum/Nodes: No pathologic mediastinal, hilar, or axillary adenopathy. Thyroid, trachea, and esophagus appear normal. Lungs/Pleura: There is a spiculated left upper lobe mass measuring 6.4 x 4.6 x 5.2 cm. This mass abuts the anterior pleural surface, with an appearance consistent with primary bronchogenic malignancy. This would be amenable to bronchoscopic or percutaneous sampling. There is also a 5 mm sub solid left upper lobe nodule reference image 33/3, too small to characterize. Close attention on follow-up recommended. Upper lobe predominant emphysema. Hypoventilatory changes at the lung bases, right greater than left. No effusion or pneumothorax. Central airways are patent. Upper Abdomen: No acute abnormality. Musculoskeletal: There is mild cortical erosion along the inner aspect of the left anterior third rib abutting the left upper lobe mass, consistent with direct invasion. No other acute or destructive bony abnormalities. Reconstructed images demonstrate no additional findings. IMPRESSION: 1. Spiculated left upper lobe mass  measuring up to 6.4 cm, consistent with bronchogenic malignancy. This mass involves the anterior pleural surface, with direct invasion and erosion of the inner cortex of the left anterior third rib. This mass is amenable to percutaneous or endobronchial sampling. 2. Indeterminate 5 mm sub solid left upper lobe pulmonary nodule, too small to characterize. Close attention on follow-up recommended. 3. Aortic Atherosclerosis (ICD10-I70.0). Coronary artery atherosclerosis. Electronically Signed   By: Sharlet Salina M.D.   On: 07/23/2023 10:34   DG Chest Port 1 View  Result Date: 07/23/2023 CLINICAL DATA:  Chest pain for a few weeks. EXAM: PORTABLE CHEST 1 VIEW COMPARISON:  Radiographs 05/06/2006. No recent comparison imaging available. FINDINGS: 0913 hours. Lordotic positioning and mild patient rotation to the right. The heart size and mediastinal contours are stable with aortic atherosclerosis. Mass-like left upper lobe density measures 6.1 x 5.6 cm, not typical for pneumonia and worrisome for  malignancy. Further evaluation with chest CT recommended. The lungs are otherwise clear. There is no pleural effusion or pneumothorax. No acute osseous findings are evident. Telemetry leads overlie the chest. IMPRESSION: Mass-like left upper lobe density worrisome for bronchogenic carcinoma. Further evaluation with chest CT recommended. Electronically Signed   By: Carey Bullocks M.D.   On: 07/23/2023 09:27    Procedures Procedures    Medications Ordered in ED Medications  ketorolac (TORADOL) 30 MG/ML injection 30 mg (30 mg Intravenous Given 07/23/23 1026)  iohexol (OMNIPAQUE) 300 MG/ML solution 75 mL (75 mLs Intravenous Contrast Given 07/23/23 1024)    ED Course/ Medical Decision Making/ A&P                                 Medical Decision Making Amount and/or Complexity of Data Reviewed Labs: ordered. Radiology: ordered.  Risk Prescription drug management.   This patient presents to the ED for concern  of cp, this involves an extensive number of treatment options, and is a complaint that carries with it a high risk of complications and morbidity.  The differential diagnosis includes cardiac, pulm, gi, vascular   Co morbidities that complicate the patient evaluation  neurofibromatosis type 1, HTN, HLD, and depression   Additional history obtained:  Additional history obtained from epic chart review  Lab Tests:  I Ordered, and personally interpreted labs.  The pertinent results include:  cbc nl, ddimer neg, bmp nl, trop nl   Imaging Studies ordered:  I ordered imaging studies including cxr and ct chest I independently visualized and interpreted imaging which showed  CXR:  Mass-like left upper lobe density worrisome for bronchogenic  carcinoma. Further evaluation with chest CT recommended.  CT chest:  Spiculated left upper lobe mass measuring up to 6.4 cm,  consistent with bronchogenic malignancy. This mass involves the  anterior pleural surface, with direct invasion and erosion of the  inner cortex of the left anterior third rib. This mass is amenable  to percutaneous or endobronchial sampling.  2. Indeterminate 5 mm sub solid left upper lobe pulmonary nodule,  too small to characterize. Close attention on follow-up recommended.  3. Aortic Atherosclerosis (ICD10-I70.0). Coronary artery  atherosclerosis.   I agree with the radiologist interpretation   Cardiac Monitoring:  The patient was maintained on a cardiac monitor.  I personally viewed and interpreted the cardiac monitored which showed an underlying rhythm of: st   Medicines ordered and prescription drug management:  I ordered medication including toradol  for sx  Reevaluation of the patient after these medicines showed that the patient improved I have reviewed the patients home medicines and have made adjustments as needed   Test Considered:  ct   Problem List / ED Course:  Large lung mass:  unfortunately, this looks like cancer.  It has eroded into her 3rd rib which is probably what has caused her pain.  Pt is referred to pulm.  She knows to return if worse.  F/u with pcp.   Reevaluation:  After the interventions noted above, I reevaluated the patient and found that they have :improved   Social Determinants of Health:  Lives at home   Dispostion:  After consideration of the diagnostic results and the patients response to treatment, I feel that the patent would benefit from discharge with outpatient f/u.          Final Clinical Impression(s) / ED Diagnoses Final diagnoses:  Mass of upper  lobe of lung    Rx / DC Orders ED Discharge Orders          Ordered    HYDROcodone-acetaminophen (NORCO/VICODIN) 5-325 MG tablet  Every 4 hours PRN        07/23/23 1045    Ambulatory referral to Pulmonology       Comments: Large LUL mass.  Likely bronchogenic carcinoma.   07/23/23 1046              Jacalyn Lefevre, MD 07/23/23 1048

## 2023-07-31 ENCOUNTER — Ambulatory Visit (HOSPITAL_COMMUNITY)
Admission: RE | Admit: 2023-07-31 | Discharge: 2023-07-31 | Disposition: A | Discharge status: Home / Self Care | Payer: 59 | Source: Ambulatory Visit | Attending: Nurse Practitioner | Admitting: Nurse Practitioner

## 2023-07-31 DIAGNOSIS — Z1231 Encounter for screening mammogram for malignant neoplasm of breast: Secondary | ICD-10-CM | POA: Diagnosis present

## 2023-08-01 ENCOUNTER — Telehealth: Payer: Self-pay | Admitting: Internal Medicine

## 2023-08-01 NOTE — Telephone Encounter (Signed)
Spoke with patient regarding the 08/24/23 appointment with Dr. Verlon Setting is closed for repairs that day and new appointment is Thursday 09/14/23 at 9:30 am. Will mail new information to patient and place her on the wait list.  She voiced her understanding.

## 2023-08-13 ENCOUNTER — Emergency Department (HOSPITAL_COMMUNITY): Payer: 59

## 2023-08-13 ENCOUNTER — Emergency Department (HOSPITAL_COMMUNITY)
Admission: EM | Admit: 2023-08-13 | Discharge: 2023-08-13 | Disposition: A | Discharge status: Home / Self Care | Payer: 59 | Attending: Emergency Medicine | Admitting: Emergency Medicine

## 2023-08-13 ENCOUNTER — Other Ambulatory Visit: Payer: Self-pay

## 2023-08-13 DIAGNOSIS — Z79899 Other long term (current) drug therapy: Secondary | ICD-10-CM | POA: Diagnosis not present

## 2023-08-13 DIAGNOSIS — R079 Chest pain, unspecified: Secondary | ICD-10-CM | POA: Insufficient documentation

## 2023-08-13 DIAGNOSIS — R000 Tachycardia, unspecified: Secondary | ICD-10-CM | POA: Diagnosis not present

## 2023-08-13 DIAGNOSIS — Z720 Tobacco use: Secondary | ICD-10-CM | POA: Diagnosis not present

## 2023-08-13 DIAGNOSIS — Z85118 Personal history of other malignant neoplasm of bronchus and lung: Secondary | ICD-10-CM | POA: Insufficient documentation

## 2023-08-13 DIAGNOSIS — I10 Essential (primary) hypertension: Secondary | ICD-10-CM | POA: Insufficient documentation

## 2023-08-13 LAB — BASIC METABOLIC PANEL
Anion gap: 10 (ref 5–15)
BUN: 14 mg/dL (ref 8–23)
CO2: 22 mmol/L (ref 22–32)
Calcium: 9.8 mg/dL (ref 8.9–10.3)
Chloride: 104 mmol/L (ref 98–111)
Creatinine, Ser: 0.61 mg/dL (ref 0.44–1.00)
GFR, Estimated: >60 mL/min (ref >60)
Glucose, Bld: 91 mg/dL (ref 70–99)
Potassium: 3.2 mmol/L — ABNORMAL LOW (ref 3.5–5.1)
Sodium: 136 mmol/L (ref 135–145)

## 2023-08-13 LAB — CBC
HCT: 39.3 % (ref 36.0–46.0)
Hemoglobin: 12.7 g/dL (ref 12.0–15.0)
MCH: 29.2 pg (ref 26.0–34.0)
MCHC: 32.3 g/dL (ref 30.0–36.0)
MCV: 90.3 fL (ref 80.0–100.0)
Platelets: 476 10*3/uL — ABNORMAL HIGH (ref 150–400)
RBC: 4.35 MIL/uL (ref 3.87–5.11)
RDW: 14.8 % (ref 11.5–15.5)
WBC: 12.9 10*3/uL — ABNORMAL HIGH (ref 4.0–10.5)
nRBC: 0 % (ref 0.0–0.2)

## 2023-08-13 LAB — D-DIMER, QUANTITATIVE: D-Dimer, Quant: 0.47 ug{FEU}/mL (ref 0.00–0.50)

## 2023-08-13 MED ORDER — OXYCODONE-ACETAMINOPHEN 5-325 MG PO TABS: 1.0000 | ORAL_TABLET | Freq: Four times a day (QID) | ORAL | 0 refills | Status: DC | PRN

## 2023-08-13 MED ORDER — KETOROLAC TROMETHAMINE 30 MG/ML IJ SOLN
30.0000 mg | Freq: Once | INTRAMUSCULAR | Status: AC
  Administered 2023-08-13: 30 mg via INTRAVENOUS
  Filled 2023-08-13: qty 1

## 2023-08-13 NOTE — Discharge Instructions (Addendum)
I have sent a message to both the cancer doctor and the lung doctor to try to expedite your follow-up.  Your x-rays are unchanged, the pain that you are having is because the tumor in your lung which is a cancer is starting to eat away at one of the bones in your chest, one of your ribs.  You should take a medication like Mobic once a day, I have prescribed this for you.  You can also take oxycodone which I prescribed 1 tablet every 6 hours as needed.  Do not drive if taking this medication.  Thank you for allowing [place] to treat you in the emergency department today.  After reviewing your examination and potential testing that was done it appears that you are safe to go home.  I would like for you to follow-up with your doctor within the next several days, have them obtain your records and follow-up with them to review all potential tests and results from your visit.  If you should develop severe or worsening symptoms return to the emergency department immediately

## 2023-08-13 NOTE — ED Provider Notes (Addendum)
[place] EMERGENCY DEPARTMENT AT [facility] Provider Note   CSN: [id number] Arrival date & time: 08/13/23  1221     History  Chief Complaint  Patient presents with   Arm Pain    Denise Rivas is a 62 y.o. female.   Arm Pain   This patient is a 62 year old female, she has a history of neurofibromatosis as well as hypertension, she is a longtime tobacco user and presents with left axillary pain which has been present for some time.  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  This was likely the cause of her pain at the time, she was referred to pulmonology, they have not yet seen her in fact her visit has been bumped until the end of January.  She reports that she continues to have the pain in the left upper axillary region    Home Medications Prior to Admission medications   Medication Sig Start Date End Date Taking? Authorizing Provider  oxyCODONE-acetaminophen (PERCOCET/ROXICET) 5-325 MG tablet Take 1 tablet by mouth every 6 (six) hours as needed for severe pain (pain score 7-10). 08/13/23  Yes Eber Hong, MD  amLODipine (NORVASC) 5 MG tablet Take 5 mg by mouth daily.    [provider]  citalopram (CELEXA) 20 MG tablet Take 20 mg by mouth daily.    [provider]  gabapentin (NEURONTIN) 300 MG capsule Take 300 mg by mouth at bedtime.    [provider]      Allergies    Patient has no allergy information on record.    Review of  Systems   Review of Systems  All other systems reviewed and are negative.   Physical Exam Updated Vital Signs BP (!) 136/90 (BP Location: Right Arm)   Pulse (!) 128   Temp 98.2 F (36.8 C) (Oral)   Resp 19   Ht 1.676 m (5\' 6" )   Wt 63 kg   SpO2 97%   BMI 22.42 kg/m  Physical Exam Vitals and nursing note reviewed.  Constitutional:      General: She is not in acute distress.    Appearance: She is well-developed.  HENT:     Head: Normocephalic and atraumatic.     Mouth/Throat:     Pharynx: No oropharyngeal exudate.  Eyes:     General: No scleral icterus.       Right eye: No discharge.        Left eye: No discharge.     Conjunctiva/sclera: Conjunctivae normal.     Pupils: Pupils are equal, round, and reactive to light.  Neck:     Thyroid: No thyromegaly.     Vascular: No JVD.  Cardiovascular:     Rate and Rhythm: Regular rhythm. Tachycardia present.     Heart sounds: Normal heart sounds. No murmur heard.    No friction rub. No gallop.  Pulmonary:     Effort: Pulmonary effort is normal. No respiratory distress.  Breath sounds: Normal breath sounds. No wheezing or rales.  Abdominal:     General: Bowel sounds are normal. There is no distension.     Palpations: Abdomen is soft. There is no mass.     Tenderness: There is no abdominal tenderness.  Musculoskeletal:        General: No tenderness. Normal range of motion.     Cervical back: Normal range of motion and neck supple.     Right lower leg: No edema.     Left lower leg: No edema.  Lymphadenopathy:     Cervical: No cervical adenopathy.  Skin:    General: Skin is warm and dry.     Findings: No erythema or rash.     Comments: Neurofibromatosis diffusely  Neurological:     Mental Status: She is alert.     Coordination: Coordination normal.  Psychiatric:        Behavior: Behavior normal.     ED Results / Procedures / Treatments   Labs (all labs ordered are listed, but only abnormal results are  displayed) Labs Reviewed  CBC - Abnormal; Notable for the following components:      Result Value   WBC 12.9 (*)    Platelets 476 (*)    All other components within normal limits  BASIC METABOLIC PANEL - Abnormal; Notable for the following components:   Potassium 3.2 (*)    All other components within normal limits  D-DIMER, QUANTITATIVE    EKG None  Radiology DG Chest 2 View Result Date: 08/13/2023 CLINICAL DATA:  Lung mass. EXAM: CHEST - 2 VIEW COMPARISON:  July 23, 2023. FINDINGS: The heart size and mediastinal contours are within normal limits. Stable large left upper lobe rounded mass is noted consistent with malignancy as noted on prior studies. Right lung is clear. The visualized skeletal structures are unremarkable. IMPRESSION: Stable large left upper lobe rounded mass consistent with malignancy as described on prior exams. Electronically Signed   By: Lupita Raider M.D.   On: 08/13/2023 12:55    Procedures Procedures    Medications Ordered in ED Medications  ketorolac (TORADOL) 30 MG/ML injection 30 mg (30 mg Intravenous Given 08/13/23 1329)    ED Course/ Medical Decision Making/ A&P                                 Medical Decision Making Amount and/or Complexity of Data Reviewed Labs: ordered. Radiology: ordered.  Risk Prescription drug management.    This patient presents to the ED for concern of increasing pain in the left axilla, she is incidentally tachycardic to almost 130 bpm.  Otherwise she does not appear in distress and her lung sounds are unremarkable.  She is not hypoxic or tachypneic or febrile or hypotensive., this involves an extensive number of treatment options, and is a complaint that carries with it a high risk of complications and morbidity.  The differential diagnosis includes worsening erosion of the bone, potential pneumothorax, consider pulmonary embolism given the risk with tachycardia and known malignancy   Co morbidities that  complicate the patient evaluation  Known malignancy   Additional history obtained:  Additional history obtained from medical record External records from outside source obtained and reviewed including CT scan from 3 weeks ago   Lab Tests:  I Ordered, and personally interpreted labs.  The pertinent results include: D-dimer negative, labs otherwise unremarkable   Imaging Studies ordered:  I  ordered imaging studies including chest x-ray I independently visualized and interpreted imaging which showed unchanged left upper lung mass I agree with the radiologist interpretation   Cardiac Monitoring: / EKG:  The patient was maintained on a cardiac monitor.  I personally viewed and interpreted the cardiac monitored which showed an underlying rhythm of: Sinus tachycardia gradually improved, pain medication given   Consultations Obtained:  I requested consultation with the in basket messages sent to both oncology and pulmonology requesting that they work collaboratively to expedite patient's evaluation, she is already scheduled for a pulmonary appointment, oncology has been made aware of the patient and need for follow-up as well   Problem List / ED Course / Critical interventions / Medication management  Pain is likely coming from the bronchogenic invasion of the patient's third rib, no signs of pneumothorax or pneumonia I ordered medication including oxycodone for home for pain Reevaluation of the patient after these medicines showed that the patient stable I have reviewed the patients home medicines and have made adjustments as needed   Social Determinants of Health:  Cancer, new onset   Test / Admission - Considered:  Considered admission but no signs of PE   After talking with the patient extensively it was not clear that she knew exactly what she had, at the end of the visit she was able to very clearly tell me that she had some type of lung cancer and that she would need  close follow-up, she is aware of the need for this and aware of the gravity of the situation.      Final Clinical Impression(s) / ED Diagnoses Final diagnoses:  Hx of bronchogenic malignancy  Chest pain at rest    Rx / DC Orders ED Discharge Orders          Ordered    oxyCODONE-acetaminophen (PERCOCET/ROXICET) 5-325 MG tablet  Every 6 hours PRN        08/13/23 1333              Eber Hong, MD 08/13/23 1334    Eber Hong, MD 08/13/23 1334

## 2023-08-13 NOTE — ED Triage Notes (Signed)
Pt c/o left arm pit pain since last week.

## 2023-08-23 DIAGNOSIS — R918 Other nonspecific abnormal finding of lung field: Secondary | ICD-10-CM | POA: Insufficient documentation

## 2023-08-23 NOTE — Progress Notes (Signed)
 Denise Rivas [address] [place] [address]   Clinic Day:  08/24/2023  Referring physician: [name], FNP  Patient Care Team: Denise Tillman CROME, FNP as PCP - General (Family Medicine)   ASSESSMENT & PLAN:   Assessment:  1.  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  Mass abuts anterior pleural surface.  There is mild cortical erosion along the inner aspect of the left anterior third rib abutting the left upper lobe mass consistent with direct invasion.  Indeterminate 5 mm subsolid LUL nodule too small to characterize. - Denies any fevers, night sweats or weight loss.  No hemoptysis. - She reports burning all over the body.  Also reports left chest wall pain, aching type preventing her to sleep on the left side.  She is taking Tylenol  as needed which helps.  2. Social/Family History: -Lives at home alone and does not work. Tobacco use of 1 cigarette a day since her 20's. No chemical exposures.  -Mother had uterine cancer. 2 sisters had breast cancer.   Plan:  1.  Highly likely left lung cancer: - We reviewed images with the patient. - Recommend CT-guided biopsy of the left lung mass. - Recommend PET CT scan and MRI for further staging. - She is an oligo smoker.  The likelihood of her having targetable mutations is high.  Hence we will send [employer] testing. - RTC after scans and biopsy.  2.  Left chest wall pain: - She reports achy pain in the left chest wall on and off. - Continue Tylenol  as needed.  If uncontrolled, will prescribe narcotics.   Orders Placed This Encounter  Procedures   NM PET Image Initial (PI) Skull Base To Thigh    Standing Status:   Future    Expected Date:   08/31/2023    Expiration Date:   08/23/2024    If indicated for the ordered procedure, I authorize the  administration of a radiopharmaceutical per Radiology protocol:   Yes    Preferred imaging location?:   [facility]    Release to patient:   Immediate   MR Brain W Wo Contrast    Standing Status:   Future    Expected Date:   08/31/2023    Expiration Date:   08/23/2024    If indicated for the ordered procedure, I authorize the administration of contrast media per Radiology protocol:   Yes    What is the patient's sedation requirement?:   No Sedation    Does the patient have a pacemaker or implanted devices?:   No    Use SRS Protocol?:   No    Preferred imaging location?:   [facility] (table limit - 550lbs)    Release to patient:   Immediate   CT BIOPSY    Standing Status:   Future    Expected Date:   08/31/2023    Expiration Date:   08/23/2024    Lab orders requested (DO NOT place separate lab orders, these will be automatically ordered during procedure specimen collection)::   Surgical Pathology    Reason for Exam (SYMPTOM  OR DIAGNOSIS REQUIRED):   L Lung mass    Preferred location?:   [facility]    Release to patient:   Immediate  Denise Rivas,acting as a neurosurgeon for Denise Stands, Rivas.,have documented all relevant documentation on the behalf of Denise Stands, Rivas,as directed by  Denise Stands, Rivas while in the presence of Denise Stands, Rivas.   I, Denise Rivas, have reviewed the above documentation for accuracy and completeness, and I agree with the above.   Denise Stands, Rivas   1/9/20259:10 AM  CHIEF COMPLAINT/PURPOSE OF CONSULT:   Diagnosis: Left upper lobe lung mass   Cancer Staging  No matching staging information was found for the patient.    Prior Therapy: None  Current Therapy:  Under workup   HISTORY OF PRESENT ILLNESS:   Oncology History   No history exists.      Denise Rivas is a 63 y.o. female presenting to clinic today for evaluation of left upper lung mass at the request of Denise Pole,  Rivas.  Patient was seen in the ED on 08/13/23 for left axillary pain. She was previously seen in the ED on 07/23/23 for chest pain and CT chest found: spiculated left upper lobe mass measuring up to 6.4 cm, consistent with bronchogenic malignancy and involving the anterior pleural surface, with direct invasion and erosion of the inner cortex of the left anterior third rib; as well as indeterminate 5 mm sub solid left upper lobe pulmonary nodule, too small to characterize. She was prescribed oxycodone -acetaminophen  5-325 mg q6h prn for pain. She has been referred to pulmonology and has an appointment schedule for late January 2025.  Today, she states that she is doing well overall. Her appetite level is at 100%. Her energy level is at 85%.   She reports an intermittent burning and stinging sensation throughout the body, and was reportedly in the ED told this was due to arthritis. 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   She notes constant left upper chest wall and left axillary pain, similar to a dull ache for the past few months. She is unable to lie on her left side. She treats pain with OTC Tylenol  intermittently. She also occasionally takes oxycodone -acetaminophen , though she recently ran out of this. She denies any unexpected weight loss, fevers, night sweats, new onset headaches, or hemoptysis.   She denies any past medical history of MI's, TIA's, or CVA's.   PAST MEDICAL HISTORY:   Past Medical History: No past medical history on file.  Surgical History:   Social History: Social History   Socioeconomic History   Marital status: Single    Spouse name: Not on file   Number of children: Not on file   Years of education: Not on file   Highest education level: Not on file  Occupational History   Not on file  Tobacco Use   Smoking status: Not on file   Smokeless tobacco: Not on file  Substance and Sexual Activity   Alcohol use: Not on file   Drug use: Not on file   Sexual activity: Not  on file  Other Topics Concern   Not on file  Social History Narrative   Not on file   Social Drivers of Health   Financial Resource Strain: Not on file  Food Insecurity: Not on file  Transportation Needs: Not on file  Physical Activity: Not on file  Stress: Not on file  Social Connections: Unknown (06/14/2022)   Received from [facility], [facility]   Social Network    Social Network: Not on file  Intimate Partner Violence: Unknown (06/14/2022)   Received from [facility], [facility]   HITS  Physically Hurt: Not on file    Insult or Talk Down To: Not on file    Threaten Physical Harm: Not on file    Scream or Curse: Not on file    Family History: No family history on file.  Current Medications:  Current Outpatient Medications:    amLODipine (NORVASC) 5 MG tablet, Take 5 mg by mouth daily., Disp: , Rfl:    citalopram (CELEXA) 20 MG tablet, Take 20 mg by mouth daily., Disp: , Rfl:    fluticasone (FLONASE) 50 MCG/ACT nasal spray, Place into both nostrils., Disp: , Rfl:    gabapentin  (NEURONTIN ) 300 MG capsule, Take 300 mg by mouth at bedtime., Disp: , Rfl:    hydrOXYzine (ATARAX) 25 MG tablet, Take 25 mg by mouth daily., Disp: , Rfl:    LORazepam (ATIVAN) 1 MG tablet, Take by mouth., Disp: , Rfl:    oxyCODONE -acetaminophen  (PERCOCET/ROXICET) 5-325 MG tablet, Take 1 tablet by mouth every 6 (six) hours as needed for severe pain (pain score 7-10)., Disp: 10 tablet, Rfl: 0   pravastatin (PRAVACHOL) 40 MG tablet, Take 40 mg by mouth daily., Disp: , Rfl:    sertraline (ZOLOFT) 100 MG tablet, Take 100 mg by mouth daily., Disp: , Rfl:    Allergies: Not on File  REVIEW OF SYSTEMS:   Review of Systems  Constitutional:  Negative for chills, fatigue and fever.  HENT:   Negative for lump/mass, mouth sores, nosebleeds, sore throat and trouble swallowing.   Eyes:  Negative for eye problems.  Respiratory:  Negative for cough and shortness of breath.   Cardiovascular:   Negative for chest pain, leg swelling and palpitations.  Gastrointestinal:  Negative for abdominal pain, constipation, diarrhea, nausea and vomiting.  Genitourinary:  Negative for bladder incontinence, difficulty urinating, dysuria, frequency, hematuria and nocturia.   Musculoskeletal:  Negative for arthralgias, back pain, flank pain, myalgias and neck pain.  Skin:  Negative for itching and rash.  Neurological:  Negative for dizziness, headaches and numbness.  Hematological:  Does not bruise/bleed easily.  Psychiatric/Behavioral:  Negative for depression, sleep disturbance and suicidal ideas. The patient is not nervous/anxious.   All other systems reviewed and are negative.    VITALS:   Blood pressure (!) 144/101, pulse (!) 124, temperature (!) 97.5 F (36.4 C), temperature source Oral, resp. rate 18, height 5' 1.02 (1.55 m), weight 131 lb 6.3 oz (59.6 kg), SpO2 98%.  Wt Readings from Last 3 Encounters:  08/24/23 131 lb 6.3 oz (59.6 kg)  08/13/23 138 lb 14.2 oz (63 kg)  07/23/23 140 lb (63.5 kg)    Body mass index is 24.81 kg/m.  Performance status (ECOG): 1 - Symptomatic but completely ambulatory  PHYSICAL EXAM:   Physical Exam Vitals and nursing note reviewed. Exam conducted with a chaperone present.  Constitutional:      Appearance: Normal appearance.  Cardiovascular:     Rate and Rhythm: Normal rate and regular rhythm.     Pulses: Normal pulses.     Heart sounds: Normal heart sounds.  Pulmonary:     Effort: Pulmonary effort is normal.     Breath sounds: Normal breath sounds.  Chest:     Chest wall: Tenderness (on left anterior chest wall) present.  Abdominal:     Palpations: Abdomen is soft. There is no hepatomegaly, splenomegaly or mass.     Tenderness: There is no abdominal tenderness.  Musculoskeletal:     Right lower leg: No edema.     Left lower leg: No  edema.  Lymphadenopathy:     Cervical: No cervical adenopathy.     Right cervical: No superficial, deep or  posterior cervical adenopathy.    Left cervical: No superficial, deep or posterior cervical adenopathy.     Upper Body:     Right upper body: No supraclavicular or axillary adenopathy.     Left upper body: No supraclavicular or axillary adenopathy.  Neurological:     General: No focal deficit present.     Mental Status: She is alert and oriented to person, place, and time.     Comments: +neurofibromatosis throughout the body  Psychiatric:        Mood and Affect: Mood normal.        Behavior: Behavior normal.     LABS:   CBC    Component Value Date/Time   WBC 12.9 (H) 08/13/2023 1303   RBC 4.35 08/13/2023 1303   HGB 12.7 08/13/2023 1303   HCT 39.3 08/13/2023 1303   PLT 476 (H) 08/13/2023 1303   MCV 90.3 08/13/2023 1303   MCH 29.2 08/13/2023 1303   MCHC 32.3 08/13/2023 1303   RDW 14.8 08/13/2023 1303   LYMPHSABS 1.6 10/06/2014 0915   MONOABS 0.6 10/06/2014 0915   EOSABS 0.1 10/06/2014 0915   BASOSABS 0.0 10/06/2014 0915    CMP    Component Value Date/Time   NA 136 08/13/2023 1303   K 3.2 (L) 08/13/2023 1303   CL 104 08/13/2023 1303   CO2 22 08/13/2023 1303   GLUCOSE 91 08/13/2023 1303   BUN 14 08/13/2023 1303   CREATININE 0.61 08/13/2023 1303   CALCIUM 9.8 08/13/2023 1303   PROT 7.6 10/26/2020 0843   ALBUMIN 3.7 10/26/2020 0843   AST 14 (L) 10/26/2020 0843   ALT 14 10/26/2020 0843   ALKPHOS 101 10/26/2020 0843   BILITOT 0.4 10/26/2020 0843   GFRNONAA >60 08/13/2023 1303   GFRAA >60 02/18/2020 0932     No results found for: CEA1, CEA / No results found for: CEA1, CEA No results found for: PSA1 No results found for: CAN199 No results found for: CAN125  No results found for: TOTALPROTELP, ALBUMINELP, A1GS, A2GS, BETS, BETA2SER, GAMS, MSPIKE, SPEI No results found for: TIBC, FERRITIN, IRONPCTSAT No results found for: LDH   STUDIES:   DG Chest 2 View Result Date: 08/13/2023 CLINICAL DATA:  Lung mass. EXAM: CHEST -  2 VIEW COMPARISON:  July 23, 2023. FINDINGS: The heart size and mediastinal contours are within normal limits. Stable large left upper lobe rounded mass is noted consistent with malignancy as noted on prior studies. Right lung is clear. The visualized skeletal structures are unremarkable. IMPRESSION: Stable large left upper lobe rounded mass consistent with malignancy as described on prior exams. Electronically Signed   By: Lynwood Landy Raddle M.D.   On: 08/13/2023 12:55   MM 3D SCREENING MAMMOGRAM BILATERAL BREAST Result Date: 08/01/2023 CLINICAL DATA:  Screening. EXAM: DIGITAL SCREENING BILATERAL MAMMOGRAM WITH TOMOSYNTHESIS AND CAD TECHNIQUE: Bilateral screening digital craniocaudal and mediolateral oblique mammograms were obtained. Bilateral screening digital breast tomosynthesis was performed. The images were evaluated with computer-aided detection. COMPARISON:  Previous exam(s). ACR Breast Density Category c: The breasts are heterogeneously dense, which may obscure small masses. FINDINGS: There are no findings suspicious for malignancy. IMPRESSION: No mammographic evidence of malignancy. A result letter of this screening mammogram will be mailed directly to the patient. RECOMMENDATION: Screening mammogram in one year. (Code:SM-B-01Y) BI-RADS CATEGORY  1: Negative. Electronically Signed   By: Toribio Agreste  M.D.   On: 08/01/2023 11:24

## 2023-08-24 ENCOUNTER — Inpatient Hospital Stay: Payer: 59

## 2023-08-24 ENCOUNTER — Inpatient Hospital Stay: Payer: 59 | Attending: Hematology | Admitting: Hematology

## 2023-08-24 ENCOUNTER — Ambulatory Visit: Payer: 59 | Admitting: Internal Medicine

## 2023-08-24 VITALS — BP 144/101 | HR 124 | Temp 97.5°F | Resp 18 | Ht 61.02 in | Wt 131.4 lb

## 2023-08-24 DIAGNOSIS — C3490 Malignant neoplasm of unspecified part of unspecified bronchus or lung: Secondary | ICD-10-CM | POA: Diagnosis not present

## 2023-08-24 DIAGNOSIS — F17200 Nicotine dependence, unspecified, uncomplicated: Secondary | ICD-10-CM | POA: Insufficient documentation

## 2023-08-24 DIAGNOSIS — Z79899 Other long term (current) drug therapy: Secondary | ICD-10-CM | POA: Diagnosis not present

## 2023-08-24 DIAGNOSIS — R918 Other nonspecific abnormal finding of lung field: Secondary | ICD-10-CM | POA: Diagnosis not present

## 2023-08-24 DIAGNOSIS — C3492 Malignant neoplasm of unspecified part of left bronchus or lung: Secondary | ICD-10-CM | POA: Insufficient documentation

## 2023-08-24 NOTE — Patient Instructions (Addendum)
 [facility]  Discharge Instructions  You were seen and examined today by Dr. Rogers. Dr. Katragadda is a medical oncologist, meaning that he specializes in the treatment of cancer diagnoses. Dr. Rogers discussed your past medical history, family history of cancers, and the events that led to you being here today.  You were referred to Dr. Rogers due to an abnormal CT scan which revealed a mass in your lung highly concerning for cancer.  Dr. Katragadda has recommended a PET scan and a brain MRI to see if there is cancer anywhere else within your body.  You will need a biopsy to accurately identify what type of cancer you have, they will call you to arrange the biopsy in [place].  Follow-up as scheduled.  Thank you for choosing [facility] to provide your oncology and hematology care.   To afford each patient quality time with our provider, please arrive at least 15 minutes before your scheduled appointment time. You may need to reschedule your appointment if you arrive late (10 or more minutes). Arriving late affects you and other patients whose appointments are after yours.  Also, if you miss three or more appointments without notifying the office, you may be dismissed from the clinic at the provider's discretion.    Again, thank you for choosing [facility].  Our hope is that these requests will decrease the amount of time that you wait before being seen by our physicians.   If you have a lab appointment with the Cancer Center - please note that after April 8th, all labs will be drawn in the cancer center.  You do not have to check in or register with the main entrance as you have in the past but will complete your check-in at the cancer center.            _____________________________________________________________  Should you have questions after your visit to [facility], please contact our  office at [phone] and follow the prompts.  Our office hours are 8:00 a.m. to 4:30 p.m. Monday - Thursday and 8:00 a.m. to 2:30 p.m. Friday.  Please note that voicemails left after 4:00 p.m. may not be returned until the following business day.  We are closed weekends and all major holidays.  You do have access to a nurse 24-7, just call the main number to the clinic [phone] and do not press any options, hold on the line and a nurse will answer the phone.    For prescription refill requests, have your pharmacy contact our office and allow 72 hours.    Masks are no longer required in the cancer centers. If you would like for your care team to wear a mask while they are taking care of you, please let them know. You may have one support person who is at least 63 years old accompany you for your appointments.

## 2023-08-25 NOTE — Progress Notes (Signed)
 Karalee Wilkie POUR, MD  Lanissa Cashen; P Ir Procedure Requests Approved for CT guided biopsy of LUL lung mass.  Mass is large and abuts pleura, shouldn't have to cross normal lung.    HKM       Previous Messages    ----- Message ----- From: Newt Levingston Sent: 08/24/2023   9:56 AM EST To: Aeron Lheureux; Ir Procedure Requests Subject: CT Biopsy                                      Procedure : Ct Biopsy   Reason : L Lung mass Dx: Mass of upper lobe of left lung [R91.8 (ICD-10-CM)]; Malignant neoplasm of unspecified part of unspecified bronchus or lung (HCC) [C34.90 (ICD-10-CM)]    History :CT Chest w/, MR Lumbar spine w/ , DG Chest port 1 view , Mammogram  Provider : Rogers Hai, MD  Provider contact : [phone]

## 2023-09-01 ENCOUNTER — Ambulatory Visit (HOSPITAL_COMMUNITY)
Admission: RE | Admit: 2023-09-01 | Discharge: 2023-09-01 | Disposition: A | Discharge status: Home / Self Care | Payer: 59 | Source: Ambulatory Visit | Attending: Hematology | Admitting: Hematology

## 2023-09-01 ENCOUNTER — Inpatient Hospital Stay: Payer: 59

## 2023-09-01 DIAGNOSIS — C3490 Malignant neoplasm of unspecified part of unspecified bronchus or lung: Secondary | ICD-10-CM | POA: Insufficient documentation

## 2023-09-01 LAB — GLUCOSE, CAPILLARY: Glucose-Capillary: 98 mg/dL (ref 70–99)

## 2023-09-01 MED ORDER — GADOBUTROL 1 MMOL/ML IV SOLN
6.0000 mL | Freq: Once | INTRAVENOUS | Status: AC | PRN
  Administered 2023-09-01: 6 mL via INTRAVENOUS

## 2023-09-01 MED ORDER — FLUDEOXYGLUCOSE F - 18 (FDG) INJECTION
6.5050 | Freq: Once | INTRAVENOUS | Status: AC
  Administered 2023-09-01: 6.505 via INTRAVENOUS

## 2023-09-10 ENCOUNTER — Other Ambulatory Visit: Payer: Self-pay | Admitting: Radiology

## 2023-09-10 DIAGNOSIS — R918 Other nonspecific abnormal finding of lung field: Secondary | ICD-10-CM

## 2023-09-11 ENCOUNTER — Ambulatory Visit (HOSPITAL_COMMUNITY)
Admission: RE | Admit: 2023-09-11 | Discharge: 2023-09-11 | Disposition: A | Discharge status: Home / Self Care | Payer: 59 | Source: Ambulatory Visit | Attending: Hematology | Admitting: Hematology

## 2023-09-11 ENCOUNTER — Other Ambulatory Visit: Payer: Self-pay

## 2023-09-11 DIAGNOSIS — F17200 Nicotine dependence, unspecified, uncomplicated: Secondary | ICD-10-CM | POA: Diagnosis not present

## 2023-09-11 DIAGNOSIS — C3490 Malignant neoplasm of unspecified part of unspecified bronchus or lung: Secondary | ICD-10-CM

## 2023-09-11 DIAGNOSIS — C3412 Malignant neoplasm of upper lobe, left bronchus or lung: Secondary | ICD-10-CM | POA: Diagnosis not present

## 2023-09-11 DIAGNOSIS — R918 Other nonspecific abnormal finding of lung field: Secondary | ICD-10-CM | POA: Diagnosis present

## 2023-09-11 DIAGNOSIS — Z01812 Encounter for preprocedural laboratory examination: Secondary | ICD-10-CM | POA: Insufficient documentation

## 2023-09-11 LAB — CBC
HCT: 37.5 % (ref 36.0–46.0)
Hemoglobin: 12 g/dL (ref 12.0–15.0)
MCH: 28.6 pg (ref 26.0–34.0)
MCHC: 32 g/dL (ref 30.0–36.0)
MCV: 89.5 fL (ref 80.0–100.0)
Platelets: 470 10*3/uL — ABNORMAL HIGH (ref 150–400)
RBC: 4.19 MIL/uL (ref 3.87–5.11)
RDW: 15.4 % (ref 11.5–15.5)
WBC: 10.7 10*3/uL — ABNORMAL HIGH (ref 4.0–10.5)
nRBC: 0 % (ref 0.0–0.2)

## 2023-09-11 LAB — PROTIME-INR
INR: 1.1 (ref 0.8–1.2)
Prothrombin Time: 14.4 s (ref 11.4–15.2)

## 2023-09-11 MED ORDER — FENTANYL CITRATE (PF) 100 MCG/2ML IJ SOLN
INTRAMUSCULAR | Status: AC | PRN
  Administered 2023-09-11 (×2): 50 ug via INTRAVENOUS

## 2023-09-11 MED ORDER — FENTANYL CITRATE (PF) 100 MCG/2ML IJ SOLN
INTRAMUSCULAR | Status: AC
  Filled 2023-09-11: qty 4

## 2023-09-11 MED ORDER — MIDAZOLAM HCL 2 MG/2ML IJ SOLN
INTRAMUSCULAR | Status: AC | PRN
  Administered 2023-09-11 (×2): 1 mg via INTRAVENOUS

## 2023-09-11 MED ORDER — LIDOCAINE HCL 1 % IJ SOLN
10.0000 mL | Freq: Once | INTRAMUSCULAR | Status: AC
  Administered 2023-09-11: 10 mL via INTRADERMAL

## 2023-09-11 MED ORDER — MIDAZOLAM HCL 2 MG/2ML IJ SOLN
INTRAMUSCULAR | Status: AC
  Filled 2023-09-11: qty 4

## 2023-09-11 NOTE — H&P (Signed)
Chief Complaint: Patient was seen in consultation today for left lung mass --- biopsy  at the request of Katragadda,Sreedhar  Referring Physician(s): Katragadda,Sreedhar  Supervising Physician: Irish Lack  Patient Status: [facility] - Out-pt  History of Present Illness: Denise Rivas is a 63 y.o. female   FULL Code status per pt  Likely Lung cancer- new dx Follows with Dr Ellin Saba Started with chest wall pain 07/23/23 and  to ED ++smoker  PET 08/31/22 IMPRESSION: 1. 7 cm anterior left upper lobe lung mass is markedly hypermetabolic and is invading the left chest wall with destruction of the left third anterior rib. 2. No enlarged or hypermetabolic mediastinal or hilar lymph nodes. 3. No findings for distant metastatic disease. 4. Large complex cystic and solid appearing lesion or lesions involving the right parotid gland. Some areas of low level hypermetabolism. Recommend ENT consultation. As described on the MRI of the brain a neck CT without and with contrast may be helpful for further evaluation of this process. 5. Low level hypermetabolism in the lateral limb of the left adrenal gland with Hounsfield units of 0. This is likely a benign adenoma  Scheduled now for left lung mass biopsy    Allergies: Patient has no known allergies.  Medications: Prior to Admission medications   Medication Sig Start Date End Date Taking? Authorizing Provider  amLODipine (NORVASC) 5 MG tablet Take 5 mg by mouth daily.   Yes [provider]  aspirin 81 MG chewable tablet Chew 81 mg by mouth daily.   Yes [provider]  citalopram (CELEXA) 20 MG tablet Take 20 mg by mouth daily.   Yes [provider]  gabapentin (NEURONTIN) 300 MG capsule Take 300 mg by mouth at bedtime.   Yes [provider]  hydrOXYzine (ATARAX) 25 MG tablet Take 25 mg by mouth daily. 03/31/23  Yes [provider]  LORazepam (ATIVAN) 1 MG tablet Take by mouth.  03/04/21  Yes [provider]  oxyCODONE-acetaminophen (PERCOCET/ROXICET) 5-325 MG tablet Take 1 tablet by mouth every 6 (six) hours as needed for severe pain (pain score 7-10). 08/13/23  Yes Eber Hong, MD  pravastatin (PRAVACHOL) 40 MG tablet Take 40 mg by mouth daily.   Yes [provider]  sertraline (ZOLOFT) 100 MG tablet Take 100 mg by mouth daily. 08/18/23  Yes [provider]  fluticasone (FLONASE) 50 MCG/ACT nasal spray Place into both nostrils.    [provider]     No family history on file.  Social History   Socioeconomic History   Marital status: Single    Spouse name: Not on file   Number of children: Not on file   Years of education: Not on file   Highest education level: Not on file  Occupational History   Not on file  Tobacco Use   Smoking status: Not on file   Smokeless tobacco: Not on file  Substance and Sexual Activity   Alcohol use: Not on file   Drug use: Not on file   Sexual activity: Not on file  Other Topics Concern   Not on file  Social History Narrative   Not on file   Social Drivers of Health   Financial Resource Strain: Not on file  Food Insecurity: Not on file  Transportation Needs: Not on file  Physical Activity: Not on file  Stress: Not on file  Social Connections: Unknown (06/14/2022)   Received from [facility], [facility]   Social Network    Social  Network: Not on file   Review of Systems: A 12 point ROS discussed and pertinent positives are indicated in the HPI above.  All other systems are negative.  Review of Systems  Constitutional:  Positive for activity change. Negative for fatigue, fever and unexpected weight change.  Respiratory:  Negative for cough and shortness of breath.   Cardiovascular:  Negative for chest pain.  Gastrointestinal:  Negative for nausea.  Psychiatric/Behavioral:  Negative for behavioral problems and confusion.     Vital Signs: BP 132/83   Pulse (!) 102    Temp 98.7 F (37.1 C) (Oral)   Resp 16   Ht 5\' 1"  (1.549 m)   Wt 126 lb (57.2 kg)   SpO2 97%   BMI 23.81 kg/m     Physical Exam Vitals reviewed.  HENT:     Mouth/Throat:     Mouth: Mucous membranes are moist.  Cardiovascular:     Rate and Rhythm: Normal rate and regular rhythm.     Heart sounds: Normal heart sounds.  Pulmonary:     Effort: Pulmonary effort is normal.     Breath sounds: Normal breath sounds. No wheezing.  Abdominal:     Palpations: Abdomen is soft.  Musculoskeletal:        General: Normal range of motion.  Skin:    General: Skin is warm.     Comments: Neurofibromatosis  Neurological:     Mental Status: She is alert and oriented to person, place, and time.  Psychiatric:        Behavior: Behavior normal.     Imaging: NM PET Image Initial (PI) Skull Base To Thigh Result Date: 09/10/2023 CLINICAL DATA:  Initial treatment strategy for non-small cell lung cancer. EXAM: NUCLEAR MEDICINE PET SKULL BASE TO THIGH TECHNIQUE: 6.5 mCi F-18 FDG was injected intravenously. Full-ring PET imaging was performed from the skull base to thigh after the radiotracer. CT data was obtained and used for attenuation correction and anatomic localization. Fasting blood glucose: 98 mg/dl COMPARISON:  Chest CT 95/62/1308 FINDINGS: Mediastinal blood pool activity: SUV max 1.77 Liver activity: SUV max NA NECK: No hypermetabolic lymph nodes in the neck. Incidental CT findings: Large complex cystic and solid appearing lesion or lesions involving the right parotid gland. Some areas of low level hypermetabolism with SUV max of 2.90. Recommend ENT consultation. As described on the MRI of the brain a neck CT without and with contrast may be helpful for further evaluation of this process. CHEST: The 7 cm anterior left upper lobe lung mass is markedly hypermetabolic with SUV max of 27.54. This is invading the left chest wall with destruction of the left third anterior rib. No enlarged or hypermetabolic  mediastinal or hilar lymph nodes. No supraclavicular or axillary adenopathy. No hypermetabolic breast masses. No other pulmonary lesions or pulmonary nodules to suggest pulmonary metastatic disease. Incidental CT findings: Emphysematous changes and pulmonary scarring. Significant age advanced vascular disease. ABDOMEN/PELVIS: Low level hypermetabolism noted in the lateral limb of the left adrenal gland with SUV max of 3.56. Low-attenuation lesion on the CT scan with Hounsfield units of 0. This is likely a benign adenoma. No hypermetabolic lesions in the liver, spleen, pancreas, right adrenal gland kidneys. No enlarged or hypermetabolic abdominal or pelvic lymph nodes. Incidental CT findings: Scattered vascular calcifications but no aneurysm. Status post cholecystectomy. Status post hysterectomy. SKELETON: No focal hypermetabolic activity to suggest skeletal metastasis. Incidental CT findings: None. IMPRESSION: 1. 7 cm anterior left upper lobe lung mass is markedly hypermetabolic and  is invading the left chest wall with destruction of the left third anterior rib. 2. No enlarged or hypermetabolic mediastinal or hilar lymph nodes. 3. No findings for distant metastatic disease. 4. Large complex cystic and solid appearing lesion or lesions involving the right parotid gland. Some areas of low level hypermetabolism. Recommend ENT consultation. As described on the MRI of the brain a neck CT without and with contrast may be helpful for further evaluation of this process. 5. Low level hypermetabolism in the lateral limb of the left adrenal gland with Hounsfield units of 0. This is likely a benign adenoma. Electronically Signed   By: Rudie Meyer M.D.   On: 09/10/2023 11:10   MR Brain W Wo Contrast Result Date: 09/09/2023 CLINICAL DATA:  Nine small cell lung cancer, staging. EXAM: MRI HEAD WITHOUT AND WITH CONTRAST TECHNIQUE: Multiplanar, multiecho pulse sequences of the brain and surrounding structures were obtained  without and with intravenous contrast. CONTRAST:  6mL GADAVIST GADOBUTROL 1 MMOL/ML IV SOLN COMPARISON:  None Available. FINDINGS: Brain: Diffusion imaging is normal. Chronic small-vessel ischemic changes affect the pons. No focal cerebellar finding. Cerebral hemispheres show chronic small-vessel ischemic changes of the thalami. 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 No cortical or large vessel territory infarction. No mass lesion, hemorrhage, hydrocephalus or extra-axial collection. After contrast administration, no abnormal enhancement occurs. Insignificant arachnoid cyst at the anterior middle cranial fossa on the left. Vascular: Major vessels at the base of the brain show flow. Skull and upper cervical spine: Negative Sinuses/Orbits: Clear/normal Other: Masslike swelling of the right ear and parotid gland, nature uncertain. Neoplastic disease not excluded. This was not studied primarily. IMPRESSION: 1. No evidence of metastatic disease to the brain. 2. 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 3. Masslike swelling of the right ear and parotid gland, nature uncertain. Neoplastic disease not excluded. This was not studied primarily. If concern persists, CT scan with and without contrast would be suggested. Electronically Signed   By: Paulina Fusi M.D.   On: 09/09/2023 17:09   DG Chest 2 View Result Date: 08/13/2023 CLINICAL DATA:  Lung mass. EXAM: CHEST - 2 VIEW COMPARISON:  July 23, 2023. FINDINGS: The heart size and mediastinal contours are within normal limits. Stable large left upper lobe rounded mass is noted consistent with malignancy as noted on prior studies. Right lung is clear. The visualized skeletal structures are unremarkable. IMPRESSION: Stable large left upper lobe rounded mass consistent with malignancy as described on prior exams. Electronically Signed   By: Lupita Raider M.D.   On: 08/13/2023 12:55    Labs:  CBC: Recent  Labs    07/23/23 0850 08/13/23 1303  WBC 10.6* 12.9*  HGB 12.5 12.7  HCT 38.8 39.3  PLT 422* 476*    COAGS: No results for input(s): "INR", "APTT" in the last 8760 hours.  BMP: Recent Labs    07/23/23 0850 08/13/23 1303  NA 139 136  K 4.0 3.2*  CL 103 104  CO2 24 22  GLUCOSE 111* 91  BUN 11 14  CALCIUM 10.2 9.8  CREATININE 0.57 0.61  GFRNONAA >60 >60    LIVER FUNCTION TESTS: No results for input(s): "BILITOT", "AST", "ALT", "ALKPHOS", "PROT", "ALBUMIN" in the last 8760 hours.  TUMOR MARKERS: No results for input(s): "AFPTM", "CEA", "CA199", "CHROMGRNA" in the last 8760 hours.  Assessment and Plan:  Scheduled for left lung mass biopsy Risks and benefits of CT guided lung nodule biopsy was  discussed with the patient including, but not limited to bleeding, hemoptysis, respiratory failure requiring intubation, infection, pneumothorax requiring chest tube placement, stroke from air embolism or even death.  All of the patient's questions were answered and the patient is agreeable to proceed.  Consent signed and in chart.  Thank you for this interesting consult.  I greatly enjoyed meeting Meiko Ives Gonzalez and look forward to participating in their care.  A copy of this report was sent to the requesting provider on this date.  Electronically Signed: Robet Leu, PA-C 09/11/2023, 10:00 AM   I spent a total of  30 Minutes   in face to face in clinical consultation, greater than 50% of which was counseling/coordinating care for left lung mass bx

## 2023-09-11 NOTE — Procedures (Signed)
Interventional Radiology Procedure Note  Procedure: CT Guided Biopsy of left lung  Complications: None  Estimated Blood Loss: < 10 mL  Findings: 18 G core biopsy of LUL lung mass performed under CT guidance.  Three core samples obtained and sent to Pathology.  Jodi Marble. Fredia Sorrow, M.D Pager:  [phone]

## 2023-09-13 LAB — SURGICAL PATHOLOGY

## 2023-09-14 ENCOUNTER — Encounter: Payer: Self-pay | Admitting: Internal Medicine

## 2023-09-14 ENCOUNTER — Ambulatory Visit: Payer: 59 | Admitting: Internal Medicine

## 2023-09-14 VITALS — BP 131/89 | HR 108 | Ht 61.0 in | Wt 127.4 lb

## 2023-09-14 DIAGNOSIS — R918 Other nonspecific abnormal finding of lung field: Secondary | ICD-10-CM | POA: Diagnosis not present

## 2023-09-14 DIAGNOSIS — F17210 Nicotine dependence, cigarettes, uncomplicated: Secondary | ICD-10-CM | POA: Diagnosis not present

## 2023-09-14 MED ORDER — GABAPENTIN 300 MG PO CAPS: 300.0000 mg | ORAL_CAPSULE | Freq: Three times a day (TID) | ORAL | 2 refills | Status: DC

## 2023-09-14 NOTE — Patient Instructions (Addendum)
Increase gabapentin to 300 mg twice daily for a week and then up to 300 mg three times a day  - if get woozy ok to back off by one dose and try lower dose of ativan(lorazepam) and / or atarax(hydroxyzine)   The key is to stop smoking completely before smoking completely stops you!  Keep appt with Dr  Kirtland Bouchard in February.  No need for pulmonary follow up

## 2023-09-14 NOTE — Assessment & Plan Note (Signed)
Active smoker - CT bx 09/11/23 with dx of Invasive moderately differentiated squamous cell carcinoma.  - 09/14/2023 rec gabapentin 300 mg bid titrate up to max of 300 mg tid pending assumption of tumor-related Ant L CP by Dr Kirtland Bouchard  in oncology    Advised on relationship of smoking with this type of lung cancer and the natural hx (local invasion > distant meds typical as is the case here) with no surgical option at present.  Focus of OV was on controlling (not necessarily eliminating) the pain which actually may be used by oncology to monitor tumor response to RT/ chemo  Rec Titrate up gabapentin to 300 mg tid based on control of pain s causing AMS  Discussed in detail all the  indications, usual  risks and alternatives  relative to the benefits with patient who agrees to proceed with Rx as outlined.      F/u pulmonary prn

## 2023-09-14 NOTE — Assessment & Plan Note (Signed)
Counseled re importance of smoking cessation but did not meet time criteria for separate billing            Each maintenance medication was reviewed in detail including emphasizing most importantly the difference between maintenance and prns and under what circumstances the prns are to be triggered using an action plan format where appropriate.  Total time for H and P, chart review, counseling,   and generating customized AVS unique to this office visit / same day charting = 45 min new pt eval

## 2023-09-14 NOTE — Progress Notes (Signed)
Denise Rivas, female    DOB: 08-Aug-1961    MRN: [id number]   Brief patient profile:  16  yobf active smoker referred to pulmonary clinic in [place]  09/14/2023 by EDP/[facility]  for LUL mass  s/p CT bx 09/11/23 with dx of Invasive moderately differentiated squamous cell carcinoma.      History of Present Illness  09/14/2023  Pulmonary/ 1st office eval/ Karmine Kauer / [place] [facility]  Chief Complaint  Patient presents with   Consult  Dyspnea:  Not limited by breathing from desired activities  / doing yardwork/gardening ok  Cough: none  Sleep: able to lie flat ok  SABA use: none  02: none  L ant cp x sev months, progressively worse, can't lie L side down , worse with L shoulder movements against resistance  No obvious day to day or daytime pattern/variability or assoc excess/ purulent sputum or mucus plugs or hemoptysis or chest tightness, subjective wheeze or overt sinus or hb symptoms.    Also denies any obvious fluctuation of symptoms with weather or environmental changes or other aggravating or alleviating factors except as outlined above   No unusual exposure hx or h/o childhood pna/ asthma or knowledge of premature birth.  Current Allergies, Complete Past Medical History, Past Surgical History, Family History, and Social History were reviewed in [employer] record.  ROS  The following are not active complaints unless bolded Hoarseness, sore throat, dysphagia, dental problems, itching, sneezing,  nasal congestion or discharge of excess mucus or purulent secretions, ear ache,   fever, chills, sweats, unintended wt loss or wt gain, classically pleuritic or exertional cp,  orthopnea pnd or arm/hand swelling  or leg swelling, presyncope, palpitations, abdominal pain, anorexia, nausea, vomiting, diarrhea  or change in bowel habits or change in bladder habits, change in stools or change in urine, dysuria, hematuria,  rash, arthralgias, visual complaints, headache,  numbness, weakness or ataxia or problems with walking or coordination,  change in mood or  memory.            Outpatient Medications Prior to Visit  Medication Sig Dispense Refill   amLODipine (NORVASC) 5 MG tablet Take 5 mg by mouth daily.     aspirin 81 MG chewable tablet Chew 81 mg by mouth daily.     citalopram (CELEXA) 20 MG tablet Take 20 mg by mouth daily.     fluticasone (FLONASE) 50 MCG/ACT nasal spray Place into both nostrils.     gabapentin (NEURONTIN) 300 MG capsule Take 300 mg by mouth at bedtime.     hydrOXYzine (ATARAX) 25 MG tablet Take 25 mg by mouth daily.     LORazepam (ATIVAN) 1 MG tablet Take by mouth.     oxyCODONE-acetaminophen (PERCOCET/ROXICET) 5-325 MG tablet Take 1 tablet by mouth every 6 (six) hours as needed for severe pain (pain score 7-10). 10 tablet 0   pravastatin (PRAVACHOL) 40 MG tablet Take 40 mg by mouth daily.     sertraline (ZOLOFT) 100 MG tablet Take 100 mg by mouth daily.     No facility-administered medications prior to visit.    History reviewed. No pertinent past medical history.    Objective:     BP 131/89   Pulse (!) 108   Ht 5\' 1"  (1.549 m)   Wt 127 lb 6.4 oz (57.8 kg)   SpO2 93% Comment: room air  BMI 24.07 kg/m   SpO2: 93 % (room air)  Somber amb bf nad / extensive neurofibromas  HEENT : Oropharynx  clear      Nasal turbinates nl    NECK :  without  apparent JVD/ palpable Nodes/TM    LUNGS: no acc muscle use,   mod kyphotic/ barre contour chest which is clear to A and P bilaterally without cough on insp or exp maneuvers   CV:  RRR  no s3 or murmur or increase in P2, and no edema   ABD:  soft and nontender   MS:  Gait nl  ext warm without deformities Or obvious joint restrictions  calf tenderness, cyanosis or clubbing    SKIN: warm and dry with extensive benign appearing neurofibromas   NEURO:  alert, approp, nl sensorium with  no motor or cerebellar deficits apparent.      I personally reviewed images and  agree with radiology impression as follows:   CTPET   09/01/23 1. 7 cm anterior left upper lobe lung mass is markedly hypermetabolic and is invading the left chest wall with destruction of the left third anterior rib. 2. No enlarged or hypermetabolic mediastinal or hilar lymph nodes. 3. No findings for distant metastatic disease.      Assessment   Mass of upper lobe of left lung Active smoker - CT bx 09/11/23 with dx of Invasive moderately differentiated squamous cell carcinoma.  - 09/14/2023 rec gabapentin 300 mg bid titrate up to max of 300 mg tid pending assumption of tumor-related Ant L CP by Dr Kirtland Bouchard  in oncology    Advised on relationship of smoking with this type of lung cancer and the natural hx (local invasion > distant meds typical as is the case here) with no surgical option at present.  Focus of OV was on controlling (not necessarily eliminating) the pain which actually may be used by oncology to monitor tumor response to RT/ chemo  Rec Titrate up gabapentin to 300 mg tid based on control of pain s causing AMS  Discussed in detail all the  indications, usual  risks and alternatives  relative to the benefits with patient who agrees to proceed with Rx as outlined.      F/u pulmonary prn    Cigarette smoker Counseled re importance of smoking cessation but did not meet time criteria for separate billing            Each maintenance medication was reviewed in detail including emphasizing most importantly the difference between maintenance and prns and under what circumstances the prns are to be triggered using an action plan format where appropriate.  Total time for H and P, chart review, counseling,   and generating customized AVS unique to this office visit / same day charting = 45 min new pt eval          Sandrea Hughs, MD 09/14/2023

## 2023-09-20 DIAGNOSIS — C3412 Malignant neoplasm of upper lobe, left bronchus or lung: Secondary | ICD-10-CM | POA: Insufficient documentation

## 2023-09-21 ENCOUNTER — Inpatient Hospital Stay: Payer: 59 | Attending: Hematology | Admitting: Hematology

## 2023-09-21 ENCOUNTER — Other Ambulatory Visit: Payer: Self-pay

## 2023-09-21 VITALS — BP 120/80 | HR 114 | Temp 97.9°F | Resp 20 | Wt 127.6 lb

## 2023-09-21 DIAGNOSIS — Z7982 Long term (current) use of aspirin: Secondary | ICD-10-CM | POA: Insufficient documentation

## 2023-09-21 DIAGNOSIS — Z79899 Other long term (current) drug therapy: Secondary | ICD-10-CM | POA: Diagnosis not present

## 2023-09-21 DIAGNOSIS — F17210 Nicotine dependence, cigarettes, uncomplicated: Secondary | ICD-10-CM | POA: Insufficient documentation

## 2023-09-21 DIAGNOSIS — H938X1 Other specified disorders of right ear: Secondary | ICD-10-CM | POA: Diagnosis not present

## 2023-09-21 DIAGNOSIS — C3412 Malignant neoplasm of upper lobe, left bronchus or lung: Secondary | ICD-10-CM | POA: Diagnosis not present

## 2023-09-21 DIAGNOSIS — K118 Other diseases of salivary glands: Secondary | ICD-10-CM

## 2023-09-21 DIAGNOSIS — R0789 Other chest pain: Secondary | ICD-10-CM | POA: Insufficient documentation

## 2023-09-21 MED ORDER — OXYCODONE-ACETAMINOPHEN 5-325 MG PO TABS: 1.0000 | ORAL_TABLET | Freq: Two times a day (BID) | ORAL | 0 refills | Status: DC | PRN

## 2023-09-21 NOTE — Progress Notes (Signed)
 [facility] [address] [place] [address]   Clinic Day:  09/21/2023  Referring physician: [name], FNP  Patient Care Team: [name], FNP as PCP - General (Family Medicine) Darlean Ozell NOVAK, MD as Consulting Physician (Pulmonary Disease)   ASSESSMENT & PLAN:   Assessment:  1.  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  Mass abuts anterior pleural surface.  There is mild cortical erosion along the inner aspect of the left anterior third rib abutting the left upper lobe mass consistent with direct invasion.  Indeterminate 5 mm subsolid LUL nodule too small to characterize. - Denies any fevers, night sweats or weight loss.  No hemoptysis. - She reports burning all over the body.  Also reports left chest wall pain, aching type preventing her to sleep on the left side.  She is taking Tylenol  as needed which helps. - Guardant360: [id number], PTEN deletion (exon 1), T p53, BLM - PET scan (09/01/2023): 7 cm anterior left upper lobe mass invading left chest wall with destruction of the left third anterior rib.  No adenopathy or distant metastatic disease.  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 - MRI brain (08/31/2022): No evidence of metastatic disease. - Left upper lobe biopsy (09/11/2023): Invasive moderately differentiated squamous cell carcinoma.  2. Social/Family History: -Lives at home alone and does not work. Tobacco use of 1 cigarette a day since her 20's. No chemical exposures.  -Mother had uterine cancer. 2 sisters had breast cancer.   Plan:  1.  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 - We discussed pathology report in detail. - Will make a  referral to Dr. Kerrin to see if she is resectable.  Will consider neoadjuvant chemoimmunotherapy if she is surgical candidate.  Will also order pulmonary function testing. - Recommend port placement. - RTC 3 weeks for follow-up.  2.  Left chest wall pain: - Hydrocodone  did not help.  Will give her Percocet 5/325 twice daily as needed.  3.  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  She reports that she had this mass since childhood. - Will order CT neck with contrast. - Will refer to ENT for further evaluation.   Orders Placed This Encounter  Procedures   CT SOFT TISSUE NECK W CONTRAST    Standing Status:   Future    Expected Date:   09/28/2023    Expiration Date:   09/20/2024    If indicated for the ordered procedure, I authorize the administration of contrast media per Radiology protocol:   Yes    Does the patient have a contrast media/X-ray dye allergy?:   No    Preferred imaging location?:   [facility]   Pulmonary Function Test    Standing Status:   Future    Expected Date:   09/28/2023    Expiration Date:   09/20/2024    Where should this test be performed?:   [facility]    Full PFT: includes the following: basic spirometry, spirometry pre & post bronchodilator, diffusion capacity (DLCO), lung volumes:   Full PFT  [name],acting as a neurosurgeon for Alean Stands, MD.,have documented all relevant documentation on the behalf of Alean Stands, MD,as directed by  Alean Stands, MD while in the presence of Alean Stands, MD.  I, Alean Stands MD, have reviewed the above documentation for accuracy and completeness, and I agree with the above.    Alean Stands, MD   2/6/20254:44 PM  CHIEF COMPLAINT/PURPOSE OF CONSULT:   Diagnosis: Left upper lobe lung mass   Cancer Staging  Primary squamous cell carcinoma of upper lobe of left lung [facility]) Staging form: Lung, AJCC  V9 - Clinical stage from 09/20/2023: Stage IIB (cT3, cN0, cM0) - Unsigned    Prior Therapy: None  Current Therapy:  Under workup   HISTORY OF PRESENT ILLNESS:   Oncology History   No history exists.      Fendi is a 63 y.o. female presenting to clinic today for evaluation of left upper lung mass at the request of Cleotilde Pole, MD.  Patient was seen in the ED on 08/13/23 for left axillary pain. She was previously seen in the ED on 07/23/23 for chest pain and CT chest found: spiculated left upper lobe mass measuring up to 6.4 cm, consistent with bronchogenic malignancy and involving the anterior pleural surface, with direct invasion and erosion of the inner cortex of the left anterior third rib; as well as indeterminate 5 mm sub solid left upper lobe pulmonary nodule, too small to characterize. She was prescribed oxycodone -acetaminophen  5-325 mg q6h prn for pain. She has been referred to pulmonology and has an appointment schedule for late January 2025.  Today, she states that she is doing well overall. Her appetite level is at 100%. Her energy level is at 85%.   She reports an intermittent burning and stinging sensation throughout the body, and was reportedly in the ED told this was due to arthritis. 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   She notes constant left upper chest wall and left axillary pain, similar to a dull ache for the past few months. She is unable to lie on her left side. She treats pain with OTC Tylenol  intermittently. She also occasionally takes oxycodone -acetaminophen , though she recently ran out of this. She denies any unexpected weight loss, fevers, night sweats, new onset headaches, or hemoptysis.   She denies any past medical history of MI's, TIA's, or CVA's.   INTERVAL HISTORY:   MARISOL GIAMBRA is a 63 y.o. female presenting to the clinic today for follow-up of primary squamous cell carcinoma of the left lung. She was last seen by me on 08/24/23 in  consultation.  Since her last visit, she underwent CT lung mass biopsy on 09/11/23. Pathology of the mass revealed: invasive moderately differentiated squamous cell carcinoma. Elesha was seen by Dr. Darlean on 09/14/23, who did not recommend surgery. Gabapentin  has been increased to 300 mg tid for pain management.   Today, she states that she is doing well overall. Her appetite level is at 100%. Her energy level is at 100%.   Keir takes short daily walks to her neighbor's house and back. She notes a normal appetite.   She has a mass behind the right ear that is painful upon palpation, which has an onset since her childhood. She is unsure if it has grown in size.   She reports sharp left axillary and left chest wall pain. She is unable to sleep on the left side as it is too painful. She has taken hydrocodone  before, which did not improve pain.  She would like a pain medication for this in the morning and night. She has never had at home oxygen. If radiation treatment is necessary, she wishes to do so in [place].  PAST MEDICAL HISTORY:   Past Medical History: No past medical history on file.  Surgical History:   Social History: Social History   Socioeconomic History   Marital status: Single    Spouse name: Not on file   Number of children: Not on file   Years of education: Not on file   Highest education level: Not on file  Occupational History   Not on file  Tobacco Use   Smoking status: Some Days    Types: Cigarettes   Smokeless tobacco: Never  Vaping Use   Vaping status: Never Used  Substance and Sexual Activity   Alcohol use: Not on file   Drug use: Never   Sexual activity: Not on file  Other Topics Concern   Not on file  Social History Narrative   Not on file   Social Drivers of Health   Financial Resource Strain: Not on file  Food Insecurity: Not on file  Transportation Needs: Not on file  Physical Activity: Not on file  Stress: Not on file  Social Connections: Unknown  (06/14/2022)   Received from [facility], [facility]   Social Network    Social Network: Not on file  Intimate Partner Violence: Unknown (06/14/2022)   Received from [employer], [facility]   HITS    Physically Hurt: Not on file    Insult or Talk Down To: Not on file    Threaten Physical Harm: Not on file    Scream or Curse: Not on file    Family History: No family history on file.  Current Medications:  Current Outpatient Medications:    amLODipine (NORVASC) 5 MG tablet, Take 5 mg by mouth daily., Disp: , Rfl:    aspirin 81 MG chewable tablet, Chew 81 mg by mouth daily., Disp: , Rfl:    citalopram (CELEXA) 20 MG tablet, Take 20 mg by mouth daily., Disp: , Rfl:    fluticasone (FLONASE) 50 MCG/ACT nasal spray, Place into both nostrils., Disp: , Rfl:    gabapentin  (NEURONTIN ) 300 MG capsule, Take 1 capsule (300 mg total) by mouth 3 (three) times daily., Disp: 90 capsule, Rfl: 2   hydrOXYzine (ATARAX) 25 MG tablet, Take 25 mg by mouth daily., Disp: , Rfl:    LORazepam (ATIVAN) 1 MG tablet, Take by mouth., Disp: , Rfl:    mirtazapine  (REMERON  SOL-TAB) 45 MG disintegrating tablet, Take 45 mg by mouth at bedtime., Disp: , Rfl:    oxyCODONE -acetaminophen  (PERCOCET/ROXICET) 5-325 MG tablet, Take 1 tablet by mouth every 12 (twelve) hours as needed for severe pain (pain score 7-10)., Disp: 60 tablet, Rfl: 0   pravastatin (PRAVACHOL) 40 MG tablet, Take 40 mg by mouth daily., Disp: , Rfl:    sertraline (ZOLOFT) 100 MG tablet, Take 100 mg by mouth daily., Disp: , Rfl:    Allergies: No Known Allergies  REVIEW OF SYSTEMS:   Review of Systems  Constitutional:  Negative for chills, fatigue and fever.  HENT:   Negative for lump/mass, mouth sores, nosebleeds, sore throat and trouble swallowing.   Eyes:  Negative for eye problems.  Respiratory:  Negative for cough and shortness of breath.   Cardiovascular:  Positive for chest pain (1/10 severity). Negative for leg swelling and  palpitations.  Gastrointestinal:  Negative for abdominal pain, constipation, diarrhea, nausea and  vomiting.  Genitourinary:  Negative for bladder incontinence, difficulty urinating, dysuria, frequency, hematuria and nocturia.   Musculoskeletal:  Negative for arthralgias, back pain, flank pain, myalgias and neck pain.       +left axillary pain, 1/10 severity  Skin:  Negative for itching and rash.  Neurological:  Negative for dizziness, headaches and numbness.  Hematological:  Does not bruise/bleed easily.  Psychiatric/Behavioral:  Positive for sleep disturbance. Negative for depression and suicidal ideas. The patient is not nervous/anxious.   All other systems reviewed and are negative.    VITALS:   Blood pressure 120/80, pulse (!) 114, temperature 97.9 F (36.6 C), temperature source Tympanic, resp. rate 20, weight 127 lb 10.3 oz (57.9 kg), SpO2 95%.  Wt Readings from Last 3 Encounters:  09/21/23 127 lb 10.3 oz (57.9 kg)  09/14/23 127 lb 6.4 oz (57.8 kg)  09/11/23 126 lb (57.2 kg)    Body mass index is 24.12 kg/m.  Performance status (ECOG): 1 - Symptomatic but completely ambulatory  PHYSICAL EXAM:   Physical Exam Vitals and nursing note reviewed. Exam conducted with a chaperone present.  Constitutional:      Appearance: Normal appearance.  Cardiovascular:     Rate and Rhythm: Normal rate and regular rhythm.     Pulses: Normal pulses.     Heart sounds: Normal heart sounds.  Pulmonary:     Effort: Pulmonary effort is normal.     Breath sounds: Normal breath sounds.  Abdominal:     Palpations: Abdomen is soft. There is no hepatomegaly, splenomegaly or mass.     Tenderness: There is no abdominal tenderness.  Musculoskeletal:     Right lower leg: No edema.     Left lower leg: No edema.  Lymphadenopathy:     Cervical: No cervical adenopathy.     Right cervical: No superficial, deep or posterior cervical adenopathy.    Left cervical: No superficial, deep or posterior  cervical adenopathy.     Upper Body:     Right upper body: No supraclavicular or axillary adenopathy.     Left upper body: No supraclavicular or axillary adenopathy.  Neurological:     General: No focal deficit present.     Mental Status: She is alert and oriented to person, place, and time.  Psychiatric:        Mood and Affect: Mood normal.        Behavior: Behavior normal.     LABS:   CBC    Component Value Date/Time   WBC 10.7 (H) 09/11/2023 0957   RBC 4.19 09/11/2023 0957   HGB 12.0 09/11/2023 0957   HCT 37.5 09/11/2023 0957   PLT 470 (H) 09/11/2023 0957   MCV 89.5 09/11/2023 0957   MCH 28.6 09/11/2023 0957   MCHC 32.0 09/11/2023 0957   RDW 15.4 09/11/2023 0957   LYMPHSABS 1.6 10/06/2014 0915   MONOABS 0.6 10/06/2014 0915   EOSABS 0.1 10/06/2014 0915   BASOSABS 0.0 10/06/2014 0915    CMP    Component Value Date/Time   NA 136 08/13/2023 1303   K 3.2 (L) 08/13/2023 1303   CL 104 08/13/2023 1303   CO2 22 08/13/2023 1303   GLUCOSE 91 08/13/2023 1303   BUN 14 08/13/2023 1303   CREATININE 0.61 08/13/2023 1303   CALCIUM 9.8 08/13/2023 1303   PROT 7.6 10/26/2020 0843   ALBUMIN 3.7 10/26/2020 0843   AST 14 (L) 10/26/2020 0843   ALT 14 10/26/2020 0843   ALKPHOS 101 10/26/2020 0843  BILITOT 0.4 10/26/2020 0843   GFRNONAA >60 08/13/2023 1303   GFRAA >60 02/18/2020 0932     No results found for: CEA1, CEA / No results found for: CEA1, CEA No results found for: PSA1 No results found for: CAN199 No results found for: CAN125  No results found for: TOTALPROTELP, ALBUMINELP, A1GS, A2GS, BETS, BETA2SER, GAMS, MSPIKE, SPEI No results found for: TIBC, FERRITIN, IRONPCTSAT No results found for: LDH   STUDIES:   CT LUNG MASS BIOPSY Result Date: 09/12/2023 CLINICAL DATA:  Large left upper lobe lung mass. EXAM: CT GUIDED CORE BIOPSY OF LEFT UPPER LOBE LUNG MASS ANESTHESIA/SEDATION: Moderate (conscious) sedation was employed during  this procedure. A total of Versed  2.0 mg and Fentanyl  100 mcg was administered intravenously. Moderate Sedation Time: 30 minutes. The patient's level of consciousness and vital signs were monitored continuously by radiology nursing throughout the procedure under my direct supervision. PROCEDURE: The procedure risks, benefits, and alternatives were explained to the patient. Questions regarding the procedure were encouraged and answered. The patient understands and consents to the procedure. A time-out was performed prior to initiating the procedure. The left anterior chest wall was prepped with chlorhexidine  in a sterile fashion, and a sterile drape was applied covering the operative field. A sterile gown and sterile gloves were used for the procedure. Local anesthesia was provided with 1% Lidocaine . CT was performed in a supine position to localize a left upper lobe lung mass. Under CT guidance, a 17 gauge trocar needle was advanced from an anterior approach into the mass. Three separate coaxial 18 gauge core biopsy samples were then obtained. The BioSentry device was used in depositing a plug at the level of pleural entry. The outer needle was then removed and postprocedural CT imaging performed. RADIATION DOSE REDUCTION: This exam was performed according to the departmental dose-optimization program which includes automated exposure control, adjustment of the mA and/or kV according to patient size and/or use of iterative reconstruction technique. COMPLICATIONS: None FINDINGS: Large anterior left upper lobe lung mass again noted invading the anterior chest wall. Maximal tumor diameter by CT today is approximately 7.9 cm. Solid tissue was obtained. Postprocedural imaging demonstrates no immediate pneumothorax or hemorrhage. IMPRESSION: CT-guided core biopsy performed of a large left upper lobe lung mass measuring up to 7.9 cm in greatest diameter today. Electronically Signed   By: Marcey Moan M.D.   On:  09/12/2023 10:16   NM PET Image Initial (PI) Skull Base To Thigh Result Date: 09/10/2023 CLINICAL DATA:  Initial treatment strategy for non-small cell lung cancer. EXAM: NUCLEAR MEDICINE PET SKULL BASE TO THIGH TECHNIQUE: 6.5 mCi F-18 FDG was injected intravenously. Full-ring PET imaging was performed from the skull base to thigh after the radiotracer. CT data was obtained and used for attenuation correction and anatomic localization. Fasting blood glucose: 98 mg/dl COMPARISON:  Chest CT 87/91/7975 FINDINGS: Mediastinal blood pool activity: SUV max 1.77 Liver activity: SUV max NA NECK: No hypermetabolic lymph nodes in the neck. Incidental CT findings: Large complex cystic and solid appearing lesion or lesions involving the right parotid gland. Some areas of low level hypermetabolism with SUV max of 2.90. Recommend ENT consultation. As described on the MRI of the brain a neck CT without and with contrast may be helpful for further evaluation of this process. CHEST: The 7 cm anterior left upper lobe lung mass is markedly hypermetabolic with SUV max of 27.54. This is invading the left chest wall with destruction of the left third anterior rib. No enlarged  or hypermetabolic mediastinal or hilar lymph nodes. No supraclavicular or axillary adenopathy. No hypermetabolic breast masses. No other pulmonary lesions or pulmonary nodules to suggest pulmonary metastatic disease. Incidental CT findings: Emphysematous changes and pulmonary scarring. Significant age advanced vascular disease. ABDOMEN/PELVIS: Low level hypermetabolism noted in the lateral limb of the left adrenal gland with SUV max of 3.56. Low-attenuation lesion on the CT scan with Hounsfield units of 0. This is likely a benign adenoma. No hypermetabolic lesions in the liver, spleen, pancreas, right adrenal gland kidneys. No enlarged or hypermetabolic abdominal or pelvic lymph nodes. Incidental CT findings: Scattered vascular calcifications but no aneurysm.  Status post cholecystectomy. Status post hysterectomy. SKELETON: No focal hypermetabolic activity to suggest skeletal metastasis. Incidental CT findings: None. IMPRESSION: 1. 7 cm anterior left upper lobe lung mass is markedly hypermetabolic and is invading the left chest wall with destruction of the left third anterior rib. 2. No enlarged or hypermetabolic mediastinal or hilar lymph nodes. 3. No findings for distant metastatic disease. 4. Large complex cystic and solid appearing lesion or lesions involving the right parotid gland. Some areas of low level hypermetabolism. Recommend ENT consultation. As described on the MRI of the brain a neck CT without and with contrast may be helpful for further evaluation of this process. 5. Low level hypermetabolism in the lateral limb of the left adrenal gland with Hounsfield units of 0. This is likely a benign adenoma. Electronically Signed   By: MYRTIS Stammer M.D.   On: 09/10/2023 11:10   MR Brain W Wo Contrast Result Date: 09/09/2023 CLINICAL DATA:  Nine small cell lung cancer, staging. EXAM: MRI HEAD WITHOUT AND WITH CONTRAST TECHNIQUE: Multiplanar, multiecho pulse sequences of the brain and surrounding structures were obtained without and with intravenous contrast. CONTRAST:  6mL GADAVIST  GADOBUTROL  1 MMOL/ML IV SOLN COMPARISON:  None Available. FINDINGS: Brain: Diffusion imaging is normal. Chronic small-vessel ischemic changes affect the pons. No focal cerebellar finding. Cerebral hemispheres show chronic small-vessel ischemic changes of the thalami. 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 No cortical or large vessel territory infarction. No mass lesion, hemorrhage, hydrocephalus or extra-axial collection. After contrast administration, no abnormal enhancement occurs. Insignificant arachnoid cyst at the anterior middle cranial fossa on the left. Vascular: Major vessels at the base of the brain show flow. Skull and upper cervical spine: Negative  Sinuses/Orbits: Clear/normal Other: Masslike swelling of the right ear and parotid gland, nature uncertain. Neoplastic disease not excluded. This was not studied primarily. IMPRESSION: 1. No evidence of metastatic disease to the brain. 2. 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 3. Masslike swelling of the right ear and parotid gland, nature uncertain. Neoplastic disease not excluded. This was not studied primarily. If concern persists, CT scan with and without contrast would be suggested. Electronically Signed   By: Oneil Officer M.D.   On: 09/09/2023 17:09

## 2023-09-21 NOTE — Patient Instructions (Addendum)
 [facility] at [facility] Discharge Instructions   You were seen and examined today by Dr. Rogers.  He reviewed the results of your biopsy that is showing a type of cancer called squamous cell lung cancer.  He reviewed the results of your PET scan. It is showing the cancer in the left upper lobe is present and invading into your left third rib in the front of your chest.   The MRI of your brain was normal.   We will make a referral to the surgeon to evaluate you for possible surgery after shrinking the tumor down with chemotherapy. We will refer you to an ENT doctor to evaluate the mass on right neck and ear.   Return as scheduled.    Thank you for choosing  Can[facility] Center at [facility] to provide your oncology and hematology care.  To afford each patient quality time with our provider, please arrive at least 15 minutes before your scheduled appointment time.   If you have a lab appointment with the Cancer Center please come in thru the Main Entrance and check in at the main information desk.  You need to re-schedule your appointment should you arrive 10 or more minutes late.  We strive to give you quality time with our providers, and arriving late affects you and other patients whose appointments are after yours.  Also, if you no show three or more times for appointments you may be dismissed from the clinic at the providers discretion.     Again, thank you for choosing [facility].  Our hope is that these requests will decrease the amount of time that you wait before being seen by our physicians.       _____________________________________________________________  Should you have questions after your visit to [facility], please contact our office at [phone] and follow the prompts.  Our office hours are 8:00 a.m. and 4:30 p.m. Monday - Friday.  Please note that voicemails left after 4:00 p.m. may not be returned  until the following business day.  We are closed weekends and major holidays.  You do have access to a nurse 24-7, just call the main number to the clinic [phone] and do not press any options, hold on the line and a nurse will answer the phone.    For prescription refill requests, have your pharmacy contact our office and allow 72 hours.    Due to Covid, you will need to wear a mask upon entering the hospital. If you do not have a mask, a mask will be given to you at the Main Entrance upon arrival. For doctor visits, patients may have 1 support person age 56 or older with them. For treatment visits, patients can not have anyone with them due to social distancing guidelines and our immunocompromised population.

## 2023-09-22 NOTE — Progress Notes (Signed)
 The proposed treatment discussed in conference is for discussion purpose only and is not a binding recommendation.  The patients have not been physically examined, or presented with their treatment options.  Therefore, final treatment plans cannot be decided.

## 2023-09-27 ENCOUNTER — Inpatient Hospital Stay (HOSPITAL_COMMUNITY): Admission: RE | Admit: 2023-09-27 | Payer: 59 | Source: Ambulatory Visit

## 2023-09-28 ENCOUNTER — Encounter (HOSPITAL_COMMUNITY): Payer: Self-pay

## 2023-09-29 ENCOUNTER — Ambulatory Visit (HOSPITAL_COMMUNITY): Payer: 59

## 2023-10-03 ENCOUNTER — Encounter: Payer: Self-pay | Admitting: General Surgery

## 2023-10-03 ENCOUNTER — Ambulatory Visit: Payer: 59 | Admitting: General Surgery

## 2023-10-03 ENCOUNTER — Ambulatory Visit (INDEPENDENT_AMBULATORY_CARE_PROVIDER_SITE_OTHER): Payer: 59 | Admitting: General Surgery

## 2023-10-03 VITALS — BP 112/77 | HR 101 | Temp 98.6°F | Resp 14 | Ht 61.0 in | Wt 125.0 lb

## 2023-10-03 DIAGNOSIS — C3412 Malignant neoplasm of upper lobe, left bronchus or lung: Secondary | ICD-10-CM

## 2023-10-03 NOTE — Patient Instructions (Signed)
 Implanted Port Insertion Implanted port insertion is a procedure to put in a port and catheter. The port is a device with an injectable disc that can be accessed by your health care provider. The port is connected to a vein in the chest or neck by a small, thin tube (catheter). There are different types of ports. The implanted port may be used as a long-term IV access for: Medicines, such as chemotherapy. Fluids. Liquid nutrition, such as total parenteral nutrition (TPN). When you have a port, your health care provider can choose to use the port instead of veins in your arms for these procedures. Tell a health care provider about: Any allergies you have. All medicines you are taking, especially blood thinners, as well as any vitamins, herbs, eye drops, creams, over-the-counter medicines, and steroids. Any problems you or family members have had with anesthetic medicines. Any bleeding problems you have. Any surgeries you have had. Any medical conditions you have or have had, including diabetes or kidney problems. Whether you are pregnant or may be pregnant. What are the risks? Generally, this is a safe procedure. However, problems may occur, including: Allergic reactions to medicines or dyes. Damage to other structures or organs. Infection. Damage to the blood vessel, bruising, or bleeding at the puncture site. Blood clot. Breakdown of the skin over the port. A collection of air in the chest that can cause one of the lungs to collapse (pneumothorax). This is rare. What happens before the procedure? Medicines Ask your health care provider about: Changing or stopping your regular medicines. This is especially important if you are taking diabetes medicines or blood thinners. Taking medicines such as aspirin and ibuprofen. These medicines can thin your blood. Do not take these medicines unless your health care provider tells you to take them. Taking over-the-counter medicines, vitamins, herbs,  and supplements. General instructions If you will be going home right after the procedure, plan to have a responsible adult: Take you home from the hospital or clinic. You will not be allowed to drive. Care for you for the time you are told. You may have blood tests. Do not use any products that contain nicotine or tobacco for at least 4 weeks before the procedure. These products include cigarettes, chewing tobacco, and vaping devices, such as e-cigarettes. If you need help quitting, ask your health care provider. Ask your health care provider what steps will be taken to help prevent infection. These may include: Removing hair at the surgery site. Washing skin with a germ-killing soap. Taking antibiotic medicine. What happens during the procedure?  An IV will be inserted into one of your veins. You will be given one or more of the following: A medicine to help you relax (sedative). A medicine to numb the area (local anesthetic). Two small incisions will be made to insert the port. One smaller incision will be made in your neck to get access to the vein where the catheter will lie. The other incision will be made in the upper chest. This is where the port will lie. The procedure may be done using continuous X-ray (fluoroscopy) or other imaging tools for guidance. The port and catheter will be placed. There may be a small, raised area where the port is placed. The port will be flushed with a saline solution, which is made of salt and water, and blood will be drawn to make sure that the port is working correctly. The incisions will be closed. Bandages (dressings) may be placed over the incisions.  The procedure may vary among health care providers and hospitals. What happens after the procedure? Your blood pressure, heart rate, breathing rate, and blood oxygen level will be monitored until you leave the hospital or clinic. If you were given a sedative during the procedure, it can affect you  for several hours. Do not drive or operate machinery until your health care provider says that it is safe. You will be given a manufacturer's information card for the type of port that you have. Keep this with you. Your port will need to be flushed and checked as told by your health care provider, usually every few weeks. A chest X-ray will be done to: Check the placement of the port. Make sure there is no injury to your lung. Summary Implanted port insertion is a procedure to put in a port and catheter. The implanted port is used as a long-term IV access. The port will need to be flushed and checked as told by your health care provider, usually every few weeks. Keep your manufacturer's information card with you at all times. This information is not intended to replace advice given to you by your health care provider. Make sure you discuss any questions you have with your health care provider. Document Revised: 02/02/2021 Document Reviewed: 02/02/2021 Elsevier Patient Education  2024 [employer].

## 2023-10-03 NOTE — Progress Notes (Unsigned)
 [facility] History and Physical  Reason for Referral: Port a catheter placement  Referring Physician: Dr. Ellin Saba   Chief Complaint   New Patient (Initial Visit)     Denise Rivas is a 63 y.o. female.  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 She will be proceeding with treatment. 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 She is here to discuss port placement, and sees cardiothoracic surgery net week.    Past Medical History:  Diagnosis Date   Neurofibromatosis, type 1 [facility])    saw Dermatology [facility] 2023    History reviewed. No pertinent surgical history.  History reviewed. No pertinent family history.  Social History   Tobacco Use   Smoking status: Some Days    Types: Cigarettes   Smokeless tobacco: Never  Vaping Use   Vaping status: Never Used  Substance Use Topics   Drug use: Never    Medications: I have reviewed the patient's current medications. Allergies as of 10/03/2023   No Known Allergies      Medication List        Accurate as of October 03, 2023  9:27 AM. If you have any questions, ask your nurse or doctor.          amLODipine 5 MG tablet Commonly known as: NORVASC Take 5 mg by mouth daily.   aspirin 81 MG chewable tablet Chew 81 mg by mouth daily.   citalopram 20 MG tablet Commonly known as: CELEXA Take 20 mg by mouth daily.   fluticasone 50 MCG/ACT nasal spray Commonly known as: FLONASE Place into both nostrils.   gabapentin 300 MG capsule Commonly known as: NEURONTIN Take 1 capsule (300 mg total) by mouth 3 (three) times daily.   hydrOXYzine 25 MG tablet Commonly known as: ATARAX Take 25 mg by mouth daily.   LORazepam 1 MG tablet Commonly known as: ATIVAN Take by mouth.   mirtazapine 45 MG disintegrating tablet Commonly known as: REMERON SOL-TAB Take 45 mg by mouth at  bedtime.   oxyCODONE-acetaminophen 5-325 MG tablet Commonly known as: PERCOCET/ROXICET Take 1 tablet by mouth every 12 (twelve) hours as needed for severe pain (pain score 7-10).   pravastatin 40 MG tablet Commonly known as: PRAVACHOL Take 40 mg by mouth daily.   sertraline 100 MG tablet Commonly known as: ZOLOFT Take 100 mg by mouth daily.         ROS:  A comprehensive review of systems was negative except for: Respiratory: positive for left lung cancer Musculoskeletal: positive for neurofibromatosis type 1  Blood pressure 112/77, pulse (!) 101, temperature 98.6 F (37 C), temperature source Oral, resp. rate 14, height 5\' 1"  (1.549 m), weight 125 lb (56.7 kg), SpO2 93%. Physical Exam Vitals reviewed.  HENT:     Head: Normocephalic.     Nose: Nose normal.  Eyes:     Extraocular Movements: Extraocular movements intact.  Cardiovascular:     Rate and Rhythm: Normal rate and regular rhythm.  Pulmonary:     Effort: Pulmonary effort is normal.     Breath sounds: Normal breath sounds.  Abdominal:     General: There is no distension.     Palpations: Abdomen is soft.  Musculoskeletal:        General: Normal range of motion.     Cervical back: Normal range of motion.  Skin:    General:

## 2023-10-04 NOTE — H&P (Signed)
 [facility] History and Physical  Reason for Referral: Port a catheter placement  Referring Physician: Dr. Ellin Rivas   Chief Complaint   New Patient (Initial Visit)     Denise Rivas is a 63 y.o. female.  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 She will be proceeding with treatment. 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 She is here to discuss port placement, and sees cardiothoracic surgery net week.    Past Medical History:  Diagnosis Date   Neurofibromatosis, type 1 [facility])    saw Dermatology [facility] 2023    History reviewed. No pertinent surgical history.  History reviewed. No pertinent family history.  Social History   Tobacco Use   Smoking status: Some Days    Types: Cigarettes   Smokeless tobacco: Never  Vaping Use   Vaping status: Never Used  Substance Use Topics   Drug use: Never    Medications: I have reviewed the patient's current medications. Allergies as of 10/03/2023   No Known Allergies      Medication List        Accurate as of October 03, 2023  9:27 AM. If you have any questions, ask your nurse or doctor.          amLODipine 5 MG tablet Commonly known as: NORVASC Take 5 mg by mouth daily.   aspirin 81 MG chewable tablet Chew 81 mg by mouth daily.   citalopram 20 MG tablet Commonly known as: CELEXA Take 20 mg by mouth daily.   fluticasone 50 MCG/ACT nasal spray Commonly known as: FLONASE Place into both nostrils.   gabapentin 300 MG capsule Commonly known as: NEURONTIN Take 1 capsule (300 mg total) by mouth 3 (three) times daily.   hydrOXYzine 25 MG tablet Commonly known as: ATARAX Take 25 mg by mouth daily.   LORazepam 1 MG tablet Commonly known as: ATIVAN Take by mouth.   mirtazapine 45 MG disintegrating tablet Commonly known as: REMERON SOL-TAB Take 45 mg by mouth at  bedtime.   oxyCODONE-acetaminophen 5-325 MG tablet Commonly known as: PERCOCET/ROXICET Take 1 tablet by mouth every 12 (twelve) hours as needed for severe pain (pain score 7-10).   pravastatin 40 MG tablet Commonly known as: PRAVACHOL Take 40 mg by mouth daily.   sertraline 100 MG tablet Commonly known as: ZOLOFT Take 100 mg by mouth daily.         ROS:  A comprehensive review of systems was negative except for: Respiratory: positive for left lung cancer Musculoskeletal: positive for neurofibromatosis type 1  Blood pressure 112/77, pulse (!) 101, temperature 98.6 F (37 C), temperature source Oral, resp. rate 14, height 5\' 1"  (1.549 m), weight 125 lb (56.7 kg), SpO2 93%. Physical Exam Vitals reviewed.  HENT:     Head: Normocephalic.     Nose: Nose normal.  Eyes:     Extraocular Movements: Extraocular movements intact.  Cardiovascular:     Rate and Rhythm: Normal rate and regular rhythm.  Pulmonary:     Effort: Pulmonary effort is normal.     Breath sounds: Normal breath sounds.  Abdominal:     General: There is no distension.     Palpations: Abdomen is soft.  Musculoskeletal:        General: Normal range of motion.     Cervical back: Normal range of motion.  Skin:    General:  Skin is warm.     Comments: Neurofibromas on the face, upper extremities, chest  Neurological:     General: No focal deficit present.     Mental Status: She is alert and oriented to person, place, and time.  Psychiatric:        Mood and Affect: Mood normal.        Behavior: Behavior normal.        Thought Content: Thought content normal.        Judgment: Judgment normal.     Results: CT chest - left sided pulmonary mass   Assessment & Plan:  Denise Rivas is a 63 y.o. female with squamous cell cancer of the lungs. She will need a port.   Discussed risk of bleeding, infection, injury to vessels, pneumothorax. Discussed with anesthesia given the neurofibromatosis type 1 and no  reported issues with swallowing or other systemic issues. Denise Rivas felt that we could proceed at [facility].     All questions were answered to the satisfaction of the patient.   Denise Rivas 10/03/2023, 9:27 AM

## 2023-10-05 ENCOUNTER — Ambulatory Visit: Payer: 59 | Admitting: General Surgery

## 2023-10-09 ENCOUNTER — Encounter (HOSPITAL_COMMUNITY)
Admission: RE | Admit: 2023-10-09 | Discharge: 2023-10-09 | Disposition: A | Discharge status: Home / Self Care | Payer: 59 | Source: Ambulatory Visit | Attending: General Surgery | Admitting: General Surgery

## 2023-10-09 ENCOUNTER — Other Ambulatory Visit: Payer: Self-pay

## 2023-10-09 ENCOUNTER — Encounter (HOSPITAL_COMMUNITY): Payer: Self-pay

## 2023-10-09 HISTORY — DX: Anxiety disorder, unspecified: F41.9

## 2023-10-09 HISTORY — DX: Malignant neoplasm of unspecified part of unspecified bronchus or lung: C34.90

## 2023-10-09 HISTORY — DX: Essential (primary) hypertension: I10

## 2023-10-09 HISTORY — DX: Pure hypercholesterolemia, unspecified: E78.00

## 2023-10-09 NOTE — Patient Instructions (Signed)
 Denise Rivas  10/09/2023     @PREFPERIOPPHARMACY @   Your procedure is scheduled on  10/12/2023.   Report to [facility] at  0745  A.M.    Call this number if you have problems the morning of surgery:  [phone]  If you experience any cold or flu symptoms such as cough, fever, chills, shortness of breath, etc. between now and your scheduled surgery, please notify us at the above number.   Remember:  Do not eat after midnight.    You may drink clear liquids until 0545 am on 10/12/2023.    Clear liquids allowed are:                    Water, Juice (No red color; non-citric and without pulp; diabetics please choose diet or no sugar options), Carbonated beverages (diabetics please choose diet or no sugar options), Clear Tea (No creamer, milk, or cream, including half & half and powdered creamer), Black Coffee Only (No creamer, milk or cream, including half & half and powdered creamer), and Clear Sports drink (No red color; diabetics please choose diet or no sugar options)    Take these medicines the morning of surgery with A SIP OF WATER       amlodipine, gabapentin, hydroxyzine, oxycodone(if needed), sertraline.    Do not wear jewelry, make-up or nail polish, including gel polish,  artificial nails, or any other type of covering on natural nails (fingers and  toes).  Do not wear lotions, powders, or perfumes, or deodorant.  Do not shave 48 hours prior to surgery.  Men may shave face and neck.  Do not bring valuables to the hospital.  [facility] is not responsible for any belongings or valuables.  Contacts, dentures or bridgework may not be worn into surgery.  Leave your suitcase in the car.  After surgery it may be brought to your room.  For patients admitted to the hospital, discharge time will be determined by your treatment team.  Patients discharged the day of surgery will not be allowed to drive home and must have someone with them for 24 hours.     Special instructions:   DO NOT smoke tobacco or vape for 24 hours before your procedure.  Please read over the following fact sheets that you were given. Coughing and Deep Breathing, Surgical Site Infection Prevention, Anesthesia Post-op Instructions, and Care and Recovery After Surgery      Implanted Port Insertion, Care After The following information offers guidance on how to care for yourself after your procedure. Your health care provider may also give you more specific instructions. If you have problems or questions, contact your health care provider. What can I expect after the procedure? After the procedure, it is common to have: Discomfort at the port insertion site. Bruising on the skin over the port. This should improve over 3-4 days. Follow these instructions at home: [facility] care After your port is placed, you will get a manufacturer's information card. The card has information about your port. Keep this card with you at all times. Take care of the port as told by your health care provider. Ask your health care provider if you or a family member can get training for taking care of the port at home. A home health care nurse will be be available to help care for the port. Make sure to remember what type of port you have. Incision care  Follow instructions from your health care provider about how to take care of your port insertion site. Make sure you: Wash your hands with soap and water for at least 20 seconds before and after you change your bandage (dressing). If soap and water are not available, use hand sanitizer. Change your dressing as told by your health care provider. Leave stitches (sutures), skin glue, or adhesive strips in place. These skin closures may need to stay in place for 2 weeks or longer. If adhesive strip edges start to loosen and curl up, you may trim the loose edges. Do not remove adhesive strips completely unless your health care provider tells you  to do that. Check your port insertion site every day for signs of infection. Check for: Redness, swelling, or pain. Fluid or blood. Warmth. Pus or a bad smell. Activity Return to your normal activities as told by your health care provider. Ask your health care provider what activities are safe for you. You may have to avoid lifting. Ask your health care provider how much you can safely lift. General instructions Take over-the-counter and prescription medicines only as told by your health care provider. Do not take baths, swim, or use a hot tub until your health care provider approves. Ask your health care provider if you may take showers. You may only be allowed to take sponge baths. If you were given a sedative during the procedure, it can affect you for several hours. Do not drive or operate machinery until your health care provider says that it is safe. Wear a medical alert bracelet in case of an emergency. This will tell any health care providers that you have a port. Keep all follow-up visits. This is important. Contact a health care provider if: You cannot flush your port with saline as directed, or you cannot draw blood from the port. You have a fever or chills. You have redness, swelling, or pain around your port insertion site. You have fluid or blood coming from your port insertion site. Your port insertion site feels warm to the touch. You have pus or a bad smell coming from the port insertion site. Get help right away if: You have chest pain or shortness of breath. You have bleeding from your port that you cannot control. These symptoms may be an emergency. Get help right away. Call 911. Do not wait to see if the symptoms will go away. Do not drive yourself to the hospital. Summary Take care of the port as told by your health care provider. Keep the manufacturer's information card with you at all times. Change your dressing as told by your health care provider. Contact a  health care provider if you have a fever or chills or if you have redness, swelling, or pain around your port insertion site. Keep all follow-up visits. This information is not intended to replace advice given to you by your health care provider. Make sure you discuss any questions you have with your health care provider. Document Revised: 02/02/2021 Document Reviewed: 02/02/2021 Elsevier Patient Education  2024 [employer].General Anesthesia, Adult, Care After The following information offers guidance on how to care for yourself after your procedure. Your health care provider may also give you more specific instructions. If you have problems or questions, contact your health care provider. What can I expect after the procedure? After the procedure, it is common for people to: Have pain or discomfort at the IV site. Have nausea or vomiting. Have a sore throat or hoarseness.  Have trouble concentrating. Feel cold or chills. Feel weak, sleepy, or tired (fatigue). Have soreness and body aches. These can affect parts of the body that were not involved in surgery. Follow these instructions at home: For the time period you were told by your health care provider:  Rest. Do not participate in activities where you could fall or become injured. Do not drive or use machinery. Do not drink alcohol. Do not take sleeping pills or medicines that cause drowsiness. Do not make important decisions or sign legal documents. Do not take care of children on your own. General instructions Drink enough fluid to keep your urine pale yellow. If you have sleep apnea, surgery and certain medicines can increase your risk for breathing problems. Follow instructions from your health care provider about wearing your sleep device: Anytime you are sleeping, including during daytime naps. While taking prescription pain medicines, sleeping medicines, or medicines that make you drowsy. Return to your normal activities as  told by your health care provider. Ask your health care provider what activities are safe for you. Take over-the-counter and prescription medicines only as told by your health care provider. Do not use any products that contain nicotine or tobacco. These products include cigarettes, chewing tobacco, and vaping devices, such as e-cigarettes. These can delay incision healing after surgery. If you need help quitting, ask your health care provider. Contact a health care provider if: You have nausea or vomiting that does not get better with medicine. You vomit every time you eat or drink. You have pain that does not get better with medicine. You cannot urinate or have bloody urine. You develop a skin rash. You have a fever. Get help right away if: You have trouble breathing. You have chest pain. You vomit blood. These symptoms may be an emergency. Get help right away. Call 911. Do not wait to see if the symptoms will go away. Do not drive yourself to the hospital. Summary After the procedure, it is common to have a sore throat, hoarseness, nausea, vomiting, or to feel weak, sleepy, or fatigue. For the time period you were told by your health care provider, do not drive or use machinery. Get help right away if you have difficulty breathing, have chest pain, or vomit blood. These symptoms may be an emergency. This information is not intended to replace advice given to you by your health care provider. Make sure you discuss any questions you have with your health care provider. Document Revised: 10/29/2021 Document Reviewed: 10/29/2021 Elsevier Patient Education  2024 Elsevier Inc.How to Use Chlorhexidine at Home in the Shower Chlorhexidine gluconate (CHG) is a germ-killing (antiseptic) wash that's used to clean the skin. It can get rid of the germs that normally live on the skin and can keep them away for about 24 hours. If you're having surgery, you may be told to shower with CHG at home the  night before surgery. This can help lower your risk for infection. To use CHG wash in the shower, follow the steps below. Supplies needed: CHG body wash. Clean washcloth. Clean towel. How to use CHG in the shower Follow these steps unless you're told to use CHG in a different way: Start the shower. Use your normal soap and shampoo to wash your face and hair. Turn off the shower or move out of the shower stream. Pour CHG onto a clean washcloth. Do not use any type of brush or rough sponge. Start at your neck, washing your body down to your  toes. Make sure you: Wash the part of your body where the surgery will be done for at least 1 minute. Do not scrub. Do not use CHG on your head or face unless your health care provider tells you to. If it gets into your ears or eyes, rinse them well with water. Do not wash your genitals with CHG. Wash your back and under your arms. Make sure to wash skin folds. Let the CHG sit on your skin for 1-2 minutes or as long as told. Rinse your entire body in the shower, including all body creases and folds. Turn off the shower. Dry off with a clean towel. Do not put anything on your skin afterward, such as powder, lotion, or perfume. Put on clean clothes or pajamas. If it's the night before surgery, sleep in clean sheets. General tips Use CHG only as told, and follow the instructions on the label. Use the full amount of CHG as told. This is often one bottle. Do not smoke and stay away from flames after using CHG. Your skin may feel sticky after using CHG. This is normal. The sticky feeling will go away as the CHG dries. Do not use CHG: If you have a chlorhexidine allergy or have reacted to chlorhexidine in the past. On open wounds or areas of skin that have broken skin, cuts, or scrapes. On babies younger than 53 months of age. Contact a health care provider if: You have questions about using CHG. Your skin gets irritated or itchy. You have a rash after  using CHG. You swallow any CHG. Call your local poison control center [phone] in the [place] Your eyes itch badly, or they become very red or swollen. Your hearing changes. You have trouble seeing. If you can't reach your provider, go to an urgent care or emergency room. Do not drive yourself. Get help right away if: You have swelling or tingling in your mouth or throat. You make high-pitched whistling sounds when you breathe, most often when you breathe out (wheeze). You have trouble breathing. These symptoms may be an emergency. Call 911 right away. Do not wait to see if the symptoms will go away. Do not drive yourself to the hospital. This information is not intended to replace advice given to you by your health care provider. Make sure you discuss any questions you have with your health care provider. Document Revised: 02/14/2023 Document Reviewed: 02/10/2022 Elsevier Patient Education  2024 [employer].

## 2023-10-10 ENCOUNTER — Ambulatory Visit (HOSPITAL_COMMUNITY)
Admission: RE | Admit: 2023-10-10 | Discharge: 2023-10-10 | Disposition: A | Discharge status: Home / Self Care | Payer: 59 | Source: Ambulatory Visit | Attending: Hematology | Admitting: Hematology

## 2023-10-10 ENCOUNTER — Institutional Professional Consult (permissible substitution) (INDEPENDENT_AMBULATORY_CARE_PROVIDER_SITE_OTHER): Payer: 59 | Admitting: Thoracic Surgery (Cardiothoracic Vascular Surgery)

## 2023-10-10 ENCOUNTER — Encounter: Payer: Self-pay | Admitting: Thoracic Surgery (Cardiothoracic Vascular Surgery)

## 2023-10-10 VITALS — BP 117/78 | HR 117 | Resp 20 | Ht 61.0 in | Wt 124.2 lb

## 2023-10-10 DIAGNOSIS — C3412 Malignant neoplasm of upper lobe, left bronchus or lung: Secondary | ICD-10-CM | POA: Diagnosis present

## 2023-10-10 LAB — PULMONARY FUNCTION TEST
DL/VA % pred: 63 %
DL/VA: 2.73 ml/min/mmHg/L
DLCO unc % pred: 49 %
DLCO unc: 8.93 ml/min/mmHg
FEF 25-75 Post: 1.71 L/s
FEF 25-75 Pre: 1.34 L/s
FEF2575-%Change-Post: 27 %
FEF2575-%Pred-Post: 83 %
FEF2575-%Pred-Pre: 65 %
FEV1-%Change-Post: 6 %
FEV1-%Pred-Post: 81 %
FEV1-%Pred-Pre: 76 %
FEV1-Post: 1.79 L
FEV1-Pre: 1.69 L
FEV1FVC-%Change-Post: 2 %
FEV1FVC-%Pred-Pre: 96 %
FEV6-%Change-Post: 2 %
FEV6-%Pred-Post: 83 %
FEV6-%Pred-Pre: 81 %
FEV6-Post: 2.29 L
FEV6-Pre: 2.24 L
FEV6FVC-%Pred-Post: 104 %
FEV6FVC-%Pred-Pre: 104 %
FVC-%Change-Post: 3 %
FVC-%Pred-Post: 81 %
FVC-%Pred-Pre: 78 %
FVC-Post: 2.34 L
FVC-Pre: 2.25 L
Post FEV1/FVC ratio: 77 %
Post FEV6/FVC ratio: 100 %
Pre FEV1/FVC ratio: 75 %
Pre FEV6/FVC Ratio: 100 %
RV % pred: 74 %
RV: 1.41 L
TLC % pred: 80 %
TLC: 3.71 L

## 2023-10-10 MED ORDER — ALBUTEROL SULFATE (2.5 MG/3ML) 0.083% IN NEBU
2.5000 mg | INHALATION_SOLUTION | Freq: Once | RESPIRATORY_TRACT | Status: AC
  Administered 2023-10-10: 2.5 mg via RESPIRATORY_TRACT

## 2023-10-10 NOTE — Progress Notes (Signed)
 PCP is Wilmon Pali, FNP Referring Provider is Doreatha Massed, MD  Chief Complaint  Patient presents with   Lung Cancer    New patient consultation, review all studies     HPI: [name] is sent for consultation regarding a left upper lobe lung mass.  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  Presented back in December with left chest and axillary pain.  Chest x-ray showed a left upper lobe mass.  CT showed a 6.4 cm left upper lobe mass invading the chest wall including destruction of the anterior third rib.  She was referred to pulmonary initially but was not seen.  She saw Dr. Ellin Saba in early January.  He did PET/CT and MRI of the brain.  Needle biopsy showed squamous cell carcinoma.  On PET the mass was hypermetabolic with an SUV of 27.  No evidence of regional or distant metastatic disease.  MRI of the brain showed no evidence of metastasis.  She continues to smoke 1 to 2 cigarettes a day.  She says she has never smoked more than that.  Denies any chest pain, pressure, tightness, shortness of breath, cough, wheezing, hemoptysis.  Denies change in appetite or weight loss.  Continues to have left anterior chest wall and axillary pain.  Claims to be able to walk up to a mile on level ground without any shortness of breath.  Zubrod Score: At the time of surgery this patient's most appropriate activity status/level should be described as: []     0    Normal activity, no symptoms [x]     1    Restricted in physical strenuous activity but ambulatory, able to do out light work []     2    Ambulatory and capable of self care, unable to do work activities, up and about >50 % of waking hours                              []     3    Only limited self care, in bed greater than 50% of waking  hours []     4    Completely disabled, no self care, confined to bed or chair []     5    Moribund  Past Medical History:  Diagnosis Date   Anxiety    Hypercholesteremia    Hypertension    Lung cancer (HCC)    Neurofibromatosis, type 1 [facility])    saw Dermatology [facility] 2023    Past Surgical History:  Procedure Laterality Date   ABDOMINAL HYSTERECTOMY      History reviewed. No pertinent family history.  Social History Social History   Tobacco Use   Smoking status: Some Days    Types: Cigarettes   Smokeless tobacco: Never  Vaping Use   Vaping status: Never Used  Substance Use Topics   Alcohol use: Not Currently   Drug use: Never    Current Outpatient Medications  Medication Sig Dispense Refill   amLODipine (NORVASC) 5 MG tablet Take 5 mg by mouth in the morning.     aspirin EC 81 MG tablet Take 81 mg by mouth in the morning.     fluticasone (FLONASE) 50 MCG/ACT nasal spray Place 1 spray into both nostrils daily as needed for allergies.  gabapentin (NEURONTIN) 300 MG capsule Take 1 capsule (300 mg total) by mouth 3 (three) times daily. (Patient taking differently: Take 300 mg by mouth See admin instructions. Take 1 capsule (300 mg) by mouth scheduled at bedtime, may take up to 2 additional dose if needed for pain.) 90 capsule 2   hydrOXYzine (ATARAX) 25 MG tablet Take 25 mg by mouth daily as needed for anxiety.     ibuprofen (ADVIL) 200 MG tablet Take 400 mg by mouth every 8 (eight) hours as needed (pain).     mirtazapine (REMERON SOL-TAB) 45 MG disintegrating tablet Take 45 mg by mouth at bedtime.     naproxen sodium (ALEVE) 220 MG tablet Take 220-440 mg by mouth 2 (two) times daily as needed (pain.).     oxyCODONE-acetaminophen (PERCOCET/ROXICET) 5-325 MG tablet Take 1 tablet by mouth every 12 (twelve) hours as needed for severe pain (pain score 7-10). 60 tablet 0   pravastatin (PRAVACHOL) 40 MG tablet Take 40 mg by mouth daily.     sertraline (ZOLOFT) 100 MG tablet  Take 100 mg by mouth in the morning.     No current facility-administered medications for this visit.    No Known Allergies  Review of Systems  Constitutional:  Negative for activity change, appetite change and unexpected weight change.  HENT:  Negative for trouble swallowing and voice change.   Respiratory:  Negative for choking, shortness of breath and wheezing.   Cardiovascular:  Negative for chest pain.  Musculoskeletal:  Positive for back pain.       Chest wall and axillary pain  All other systems reviewed and are negative.   BP 117/78 (BP Location: Left Arm, Patient Position: Sitting, Cuff Size: Normal)   Pulse (!) 117   Resp 20   Ht 5\' 1"  (1.549 m)   Wt 124 lb 3.2 oz (56.3 kg)   SpO2 97% Comment: RA  BMI 23.47 kg/m  Physical Exam Vitals reviewed.  Constitutional:      General: She is not in acute distress.    Appearance: Normal appearance.  HENT:     Head: Normocephalic and atraumatic.  Eyes:     General: No scleral icterus.    Extraocular Movements: Extraocular movements intact.  Cardiovascular:     Rate and Rhythm: Normal rate and regular rhythm.     Heart sounds: Normal heart sounds. No murmur heard. Pulmonary:     Effort: Pulmonary effort is normal. No respiratory distress.     Breath sounds: Normal breath sounds. No wheezing or rales.  Abdominal:     General: There is no distension.     Palpations: Abdomen is soft.  Lymphadenopathy:     Cervical: No cervical adenopathy.  Skin:    Comments: Diffuse nodules  Neurological:     General: No focal deficit present.     Mental Status: She is alert and oriented to person, place, and time.     Cranial Nerves: No cranial nerve deficit.  Psychiatric:     Comments: Flat affect     Diagnostic Tests: NUCLEAR MEDICINE PET SKULL BASE TO THIGH   TECHNIQUE: 6.5 mCi F-18 FDG was injected intravenously. Full-ring PET imaging was performed from the skull base to thigh after the radiotracer. CT data was obtained  and used for attenuation correction and anatomic localization.   Fasting blood glucose: 98 mg/dl   COMPARISON:  Chest CT 07/23/2023   FINDINGS: Mediastinal blood pool activity: SUV max 1.77   Liver activity: SUV max NA  NECK: No hypermetabolic lymph nodes in the neck.   Incidental CT findings: Large complex cystic and solid appearing lesion or lesions involving the right parotid gland. Some areas of low level hypermetabolism with SUV max of 2.90. Recommend ENT consultation. As described on the MRI of the brain a neck CT without and with contrast may be helpful for further evaluation of this process.   CHEST: The 7 cm anterior left upper lobe lung mass is markedly hypermetabolic with SUV max of 27.54. This is invading the left chest wall with destruction of the left third anterior rib.   No enlarged or hypermetabolic mediastinal or hilar lymph nodes. No supraclavicular or axillary adenopathy. No hypermetabolic breast masses. No other pulmonary lesions or pulmonary nodules to suggest pulmonary metastatic disease.   Incidental CT findings: Emphysematous changes and pulmonary scarring. Significant age advanced vascular disease.   ABDOMEN/PELVIS: Low level hypermetabolism noted in the lateral limb of the left adrenal gland with SUV max of 3.56. Low-attenuation lesion on the CT scan with Hounsfield units of 0. This is likely a benign adenoma. No hypermetabolic lesions in the liver, spleen, pancreas, right adrenal gland kidneys. No enlarged or hypermetabolic abdominal or pelvic lymph nodes.   Incidental CT findings: Scattered vascular calcifications but no aneurysm. Status post cholecystectomy. Status post hysterectomy.   SKELETON: No focal hypermetabolic activity to suggest skeletal metastasis.   Incidental CT findings: None.   IMPRESSION: 1. 7 cm anterior left upper lobe lung mass is markedly hypermetabolic and is invading the left chest wall with destruction of the  left third anterior rib. 2. No enlarged or hypermetabolic mediastinal or hilar lymph nodes. 3. No findings for distant metastatic disease. 4. Large complex cystic and solid appearing lesion or lesions involving the right parotid gland. Some areas of low level hypermetabolism. Recommend ENT consultation. As described on the MRI of the brain a neck CT without and with contrast may be helpful for further evaluation of this process. 5. Low level hypermetabolism in the lateral limb of the left adrenal gland with Hounsfield units of 0. This is likely a benign adenoma.     Electronically Signed   By: Rudie Meyer M.D.   On: 09/10/2023 11:10 I personally reviewed the CT and PET/CT images.  There is a large anterior left upper lobe mass with chest wall invasion with cortical destruction of the third rib.  No mediastinal or hilar adenopathy.  Parotid lesion as noted by radiology.  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  Squamous cell carcinoma left upper lobe-at least stage IIb (T3, N0), borderline IIIa (T4, N0).  Options include chemotherapy, immunotherapy, radiation, and surgery.  Needs multimodality therapy.  Given advanced stage consideration would be given to neoadjuvant chemoimmunotherapy and/or radiation followed by surgical resection.  The other option would be definitive chemoradiation.  We discussed the relative advantages and disadvantages of both these approaches.  She is a borderline surgical candidate but risk is not prohibitive.  She does have adequate pulmonary function to tolerate the procedure.  She will have  limitations postoperatively.  Would require chest wall resection.  I discussed the surgical procedure of left upper lobectomy with chest wall resection with her.  I informed  her of the general nature of the procedure including the need for general anesthesia the incisions to be used, the possible need for prosthetic reconstruction of the chest wall, the use of drainage tubes postoperatively the expected hospital stay, and the overall recovery.  I think the overall recovery in her case will be quite prolonged given her relatively limited functional status currently.  I offered the option of radiation with more immediate pain control.  She understands that her pain could be worse after surgery than it is now.  She is not interested in that approach currently.  She is already seeing Dr. Ellin Saba and is scheduled to have a port placement and then to have neoadjuvant chemoimmunotherapy.  Could consider adding radiation for pain control.  I will plan to see her back after treatment and restaging scans.  Tobacco use-claims to have never smoked more than 1 to 2 cigarettes/day.  Advised her that she needs to quit completely prior to consideration for surgery.  Plan: Follow-up with Dr. Ellin Saba for neoadjuvant chemoimmunotherapy Follow-up in 10 to 12 weeks after completion of therapy and restaging scans.  Loreli Slot, MD [facility] [phone]

## 2023-10-12 ENCOUNTER — Ambulatory Visit (HOSPITAL_COMMUNITY): Payer: 59

## 2023-10-12 ENCOUNTER — Other Ambulatory Visit: Payer: Self-pay

## 2023-10-12 ENCOUNTER — Encounter (HOSPITAL_COMMUNITY)
Admission: RE | Disposition: A | Discharge status: Home / Self Care | Payer: Self-pay | Source: Home / Self Care | Attending: General Surgery

## 2023-10-12 ENCOUNTER — Ambulatory Visit (HOSPITAL_COMMUNITY): Payer: 59 | Admitting: Anesthesiology

## 2023-10-12 ENCOUNTER — Encounter (HOSPITAL_COMMUNITY): Payer: Self-pay | Admitting: General Surgery

## 2023-10-12 ENCOUNTER — Ambulatory Visit (HOSPITAL_COMMUNITY)
Admission: RE | Admit: 2023-10-12 | Discharge: 2023-10-12 | Disposition: A | Discharge status: Home / Self Care | Payer: 59 | Attending: General Surgery | Admitting: General Surgery

## 2023-10-12 DIAGNOSIS — F17210 Nicotine dependence, cigarettes, uncomplicated: Secondary | ICD-10-CM

## 2023-10-12 DIAGNOSIS — Q8501 Neurofibromatosis, type 1: Secondary | ICD-10-CM | POA: Insufficient documentation

## 2023-10-12 DIAGNOSIS — I10 Essential (primary) hypertension: Secondary | ICD-10-CM | POA: Diagnosis not present

## 2023-10-12 DIAGNOSIS — C3412 Malignant neoplasm of upper lobe, left bronchus or lung: Secondary | ICD-10-CM | POA: Diagnosis not present

## 2023-10-12 DIAGNOSIS — C3492 Malignant neoplasm of unspecified part of left bronchus or lung: Secondary | ICD-10-CM

## 2023-10-12 HISTORY — PX: PORTACATH PLACEMENT: SHX2246

## 2023-10-12 SURGERY — INSERTION, TUNNELED CENTRAL VENOUS DEVICE, WITH PORT
Anesthesia: General | Site: Chest | Laterality: Right

## 2023-10-12 MED ORDER — LIDOCAINE HCL (PF) 1 % IJ SOLN
INTRAMUSCULAR | Status: AC
  Filled 2023-10-12: qty 30

## 2023-10-12 MED ORDER — ORAL CARE MOUTH RINSE: 15.0000 mL | Freq: Once | OROMUCOSAL | Status: AC

## 2023-10-12 MED ORDER — LIDOCAINE HCL (PF) 1 % IJ SOLN
INTRAMUSCULAR | Status: DC | PRN
  Administered 2023-10-12: 14 mL

## 2023-10-12 MED ORDER — FENTANYL CITRATE PF 50 MCG/ML IJ SOSY: 25.0000 ug | PREFILLED_SYRINGE | INTRAMUSCULAR | Status: DC | PRN

## 2023-10-12 MED ORDER — DEXAMETHASONE SODIUM PHOSPHATE 10 MG/ML IJ SOLN
INTRAMUSCULAR | Status: DC | PRN
  Administered 2023-10-12: 5 mg via INTRAVENOUS

## 2023-10-12 MED ORDER — MIDAZOLAM HCL 2 MG/2ML IJ SOLN
INTRAMUSCULAR | Status: AC
  Filled 2023-10-12: qty 2

## 2023-10-12 MED ORDER — ONDANSETRON HCL 4 MG PO TABS: 4.0000 mg | ORAL_TABLET | Freq: Three times a day (TID) | ORAL | 1 refills | Status: AC | PRN

## 2023-10-12 MED ORDER — CHLORHEXIDINE GLUCONATE 0.12 % MT SOLN
OROMUCOSAL | Status: AC
  Administered 2023-10-12: 15 mL via OROMUCOSAL
  Filled 2023-10-12: qty 15

## 2023-10-12 MED ORDER — PROPOFOL 500 MG/50ML IV EMUL
INTRAVENOUS | Status: DC | PRN
  Administered 2023-10-12: 100 ug/kg/min via INTRAVENOUS

## 2023-10-12 MED ORDER — ONDANSETRON HCL 4 MG/2ML IJ SOLN
INTRAMUSCULAR | Status: DC | PRN
Start: 2023-10-12 — End: 2023-10-12
  Administered 2023-10-12: 4 mg via INTRAVENOUS

## 2023-10-12 MED ORDER — SODIUM CHLORIDE (PF) 0.9 % IJ SOLN
INTRAMUSCULAR | Status: DC | PRN
  Administered 2023-10-12: 500 mL via INTRAVENOUS

## 2023-10-12 MED ORDER — OXYCODONE HCL 5 MG PO TABS: 5.0000 mg | ORAL_TABLET | ORAL | 0 refills | Status: DC | PRN

## 2023-10-12 MED ORDER — FENTANYL CITRATE (PF) 100 MCG/2ML IJ SOLN
INTRAMUSCULAR | Status: AC
  Filled 2023-10-12: qty 2

## 2023-10-12 MED ORDER — CHLORHEXIDINE GLUCONATE CLOTH 2 % EX PADS: 6.0000 | MEDICATED_PAD | Freq: Once | CUTANEOUS | Status: DC

## 2023-10-12 MED ORDER — CEFAZOLIN SODIUM-DEXTROSE 2-4 GM/100ML-% IV SOLN
2.0000 g | INTRAVENOUS | Status: AC
  Administered 2023-10-12: 2 g via INTRAVENOUS

## 2023-10-12 MED ORDER — LACTATED RINGERS IV SOLN: INTRAVENOUS | Status: DC

## 2023-10-12 MED ORDER — FENTANYL CITRATE (PF) 100 MCG/2ML IJ SOLN
INTRAMUSCULAR | Status: DC | PRN
  Administered 2023-10-12 (×2): 50 ug via INTRAVENOUS

## 2023-10-12 MED ORDER — CHLORHEXIDINE GLUCONATE 0.12 % MT SOLN: 15.0000 mL | Freq: Once | OROMUCOSAL | Status: AC

## 2023-10-12 MED ORDER — PROPOFOL 10 MG/ML IV BOLUS
INTRAVENOUS | Status: DC | PRN
Start: 2023-10-12 — End: 2023-10-12
  Administered 2023-10-12: 50 mg via INTRAVENOUS

## 2023-10-12 MED ORDER — SODIUM CHLORIDE 0.9 % IV SOLN: 12.5000 mg | INTRAVENOUS | Status: DC | PRN

## 2023-10-12 MED ORDER — HEPARIN SOD (PORK) LOCK FLUSH 100 UNIT/ML IV SOLN
INTRAVENOUS | Status: AC
  Filled 2023-10-12: qty 5

## 2023-10-12 MED ORDER — CHLORHEXIDINE GLUCONATE CLOTH 2 % EX PADS
6.0000 | MEDICATED_PAD | Freq: Once | CUTANEOUS | Status: DC
  Administered 2023-10-12: 6 via TOPICAL

## 2023-10-12 MED ORDER — HEPARIN SOD (PORK) LOCK FLUSH 100 UNIT/ML IV SOLN
INTRAVENOUS | Status: DC | PRN
  Administered 2023-10-12: 500 [IU] via INTRAVENOUS

## 2023-10-12 MED ORDER — MIDAZOLAM HCL 2 MG/2ML IJ SOLN
INTRAMUSCULAR | Status: DC | PRN
  Administered 2023-10-12: 2 mg via INTRAVENOUS

## 2023-10-12 MED ORDER — CEFAZOLIN SODIUM-DEXTROSE 2-4 GM/100ML-% IV SOLN
INTRAVENOUS | Status: AC
Start: 2023-10-12 — End: 2023-10-12
  Filled 2023-10-12: qty 100

## 2023-10-12 MED ORDER — OXYCODONE HCL 5 MG/5ML PO SOLN: 5.0000 mg | Freq: Once | ORAL | Status: DC | PRN

## 2023-10-12 MED ORDER — OXYCODONE HCL 5 MG PO TABS: 5.0000 mg | ORAL_TABLET | Freq: Once | ORAL | Status: DC | PRN

## 2023-10-12 SURGICAL SUPPLY — 28 items
APPLICATOR CHLORAPREP 10.5 ORG (MISCELLANEOUS) ×2 IMPLANT
BAG DECANTER FOR FLEXI CONT (MISCELLANEOUS) ×2 IMPLANT
CLOTH BEACON ORANGE TIMEOUT ST (SAFETY) ×2 IMPLANT
COVER LIGHT HANDLE STERIS (MISCELLANEOUS) ×4 IMPLANT
DECANTER SPIKE VIAL GLASS SM (MISCELLANEOUS) ×2 IMPLANT
DERMABOND ADVANCED .7 DNX12 (GAUZE/BANDAGES/DRESSINGS) ×2 IMPLANT
DRAPE C-ARM FOLDED MOBILE STRL (DRAPES) ×2 IMPLANT
ELECT REM PT RETURN 9FT ADLT (ELECTROSURGICAL) ×1 IMPLANT
ELECTRODE REM PT RTRN 9FT ADLT (ELECTROSURGICAL) ×2 IMPLANT
GLOVE BIO SURGEON STRL SZ 6.5 (GLOVE) ×2 IMPLANT
GLOVE BIOGEL PI IND STRL 6.5 (GLOVE) ×2 IMPLANT
GLOVE BIOGEL PI IND STRL 7.0 (GLOVE) ×4 IMPLANT
GOWN STRL REUS W/TWL LRG LVL3 (GOWN DISPOSABLE) ×4 IMPLANT
IV NS 500ML BAXH (IV SOLUTION) ×2 IMPLANT
KIT PORT INFUSION SMART 8FR (Port) IMPLANT
KIT TURNOVER KIT A (KITS) ×2 IMPLANT
MANIFOLD NEPTUNE II (INSTRUMENTS) ×2 IMPLANT
NDL HYPO 25X1 1.5 SAFETY (NEEDLE) ×2 IMPLANT
NEEDLE HYPO 25X1 1.5 SAFETY (NEEDLE) ×1 IMPLANT
PACK MINOR (CUSTOM PROCEDURE TRAY) ×2 IMPLANT
PAD ARMBOARD 7.5X6 YLW CONV (MISCELLANEOUS) ×2 IMPLANT
POSITIONER HEAD 8X9X4 ADT (SOFTGOODS) ×2 IMPLANT
SET BASIN LINEN APH (SET/KITS/TRAYS/PACK) ×2 IMPLANT
SUT MNCRL AB 4-0 PS2 18 (SUTURE) ×2 IMPLANT
SUT PROLENE 2 0 SH 30 (SUTURE) ×2 IMPLANT
SUT VIC AB 3-0 SH 27X BRD (SUTURE) ×2 IMPLANT
SYR 10ML LL (SYRINGE) ×4 IMPLANT
SYR CONTROL 10ML LL (SYRINGE) ×2 IMPLANT

## 2023-10-12 NOTE — Anesthesia Preprocedure Evaluation (Addendum)
 Anesthesia Evaluation  Patient identified by MRN, date of birth, ID band Patient awake    Reviewed: Allergy & Precautions, H&P , NPO status , Patient's Chart, lab work & pertinent test results, reviewed documented beta blocker date and time   Airway Mallampati: II  TM Distance: >3 FB Neck ROM: full    Dental no notable dental hx. (+) Teeth Intact, Dental Advisory Given   Pulmonary Current Smoker and Patient abstained from smoking. Lung cancer   Pulmonary exam normal breath sounds clear to auscultation       Cardiovascular Exercise Tolerance: Good hypertension, Normal cardiovascular exam Rhythm:regular Rate:Normal     Neuro/Psych negative neurological ROS  negative psych ROS   GI/Hepatic negative GI ROS, Neg liver ROS,,,  Endo/Other  negative endocrine ROS    Renal/GU negative Renal ROS  negative genitourinary   Musculoskeletal   Abdominal   Peds  Hematology negative hematology ROS (+)   Anesthesia Other Findings Neurofibromatosis  Reproductive/Obstetrics negative OB ROS                              Anesthesia Physical Anesthesia Plan  ASA: 3  Anesthesia Plan: General   Post-op Pain Management: Minimal or no pain anticipated   Induction: Intravenous  PONV Risk Score and Plan: Propofol infusion  Airway Management Planned: Nasal Cannula and Natural Airway  Additional Equipment: None  Intra-op Plan:   Post-operative Plan:   Informed Consent: I have reviewed the patients History and Physical, chart, labs and discussed the procedure including the risks, benefits and alternatives for the proposed anesthesia with the patient or authorized representative who has indicated his/her understanding and acceptance.     Dental Advisory Given  Plan Discussed with: CRNA  Anesthesia Plan Comments:          Anesthesia Quick Evaluation

## 2023-10-12 NOTE — Transfer of Care (Signed)
 Immediate Anesthesia Transfer of Care Note  Patient: Denise Rivas  Procedure(s) Performed: INSERTION PORT-A-CATH (Chest)  Patient Location: PACU  Anesthesia Type:General  Level of Consciousness: awake and alert   Airway & Oxygen Therapy: Patient Spontanous Breathing  Post-op Assessment: Post -op Vital signs reviewed and stable  Post vital signs: Reviewed  Last Vitals:  Vitals Value Taken Time  BP 122/88 10/12/23 1106  Temp 36.9 C 10/12/23 1106  Pulse 95 10/12/23 1103  Resp 14 10/12/23 1103  SpO2 100 % 10/12/23 1106  Vitals shown include unfiled device data.  Last Pain:  Vitals:   10/12/23 1106  TempSrc: Oral  PainSc: 0-No pain         Complications: There were no known notable events for this encounter.

## 2023-10-12 NOTE — Interval H&P Note (Signed)
 History and Physical Interval Note:  10/12/2023 8:11 AM  Denise Rivas  has presented today for surgery, with the diagnosis of LUNG CANCER, SCC.  The various methods of treatment have been discussed with the patient and family. After consideration of risks, benefits and other options for treatment, the patient has consented to  Procedure(s): INSERTION PORT-A-CATH (N/A) as a surgical intervention.  The patient's history has been reviewed, patient examined, no change in status, stable for surgery.  I have reviewed the patient's chart and labs.  Questions were answered to the patient's satisfaction.    Left sided lung mass and possible radiation in her future. Will plan for a right sided port.   Lucretia Roers

## 2023-10-12 NOTE — Discharge Instructions (Addendum)
 Keep area clean and dry. You can take a shower in 24 hours. Do not submerge in water until fully healed (4+ weeks).  Take tylenol and ibuprofen for pain control and roxicodone for severe pain.

## 2023-10-12 NOTE — Anesthesia Postprocedure Evaluation (Signed)
 Anesthesia Post Note  Patient: Denise Rivas  Procedure(s) Performed: INSERTION PORT-A-CATH (Chest)  Patient location during evaluation: PACU Anesthesia Type: General Level of consciousness: awake and alert Pain management: pain level controlled Vital Signs Assessment: post-procedure vital signs reviewed and stable Respiratory status: spontaneous breathing, nonlabored ventilation, respiratory function stable and patient connected to nasal cannula oxygen Cardiovascular status: blood pressure returned to baseline and stable Postop Assessment: no apparent nausea or vomiting Anesthetic complications: no   There were no known notable events for this encounter.   Last Vitals:  Vitals:   10/12/23 1100 10/12/23 1106  BP: 132/85 122/88  Pulse: 95   Resp: (!) 25   Temp:  36.9 C  SpO2: 96% 100%    Last Pain:  Vitals:   10/12/23 1106  TempSrc: Oral  PainSc: 0-No pain                 Ahaan Zobrist L Aylyn Wenzler

## 2023-10-12 NOTE — Progress Notes (Signed)
 Dr. Henreitta Leber read imaging result; patient able to be discharged home.

## 2023-10-12 NOTE — Op Note (Signed)
 Operative Note 10/12/23   Preoperative Diagnosis: Left lung squamous cell cancer    Postoperative Diagnosis: Same   Procedure(s) Performed: Port-A-Cath placement, right subclavian   Surgeon: Leatrice Jewels. Henreitta Leber, MD   Assistants: No qualified resident was available   Anesthesia: Monitored anesthesia care   Anesthesiologist: Ronelle Nigh, MD    Specimens: None   Estimated Blood Loss: Minimal   Fluoroscopy time:6 seconds   Blood Replacement: None    Complications: None    Operative Findings: Normal anatomy  Indications: Ms. Coplen is as 63 yo who has a left lung  cancer and needs to start treatment. We discussed port placement and risk of bleeding, infection, injury to vessels, malfunction, pneumothorax, use of fluoroscopy and possible ultrasound.   Procedure: The patient was brought into the operating room and monitored anesthesia was induced.  One percent lidocaine was used for local anesthesia.   The right chest and neck was prepped and draped in the usual sterile fashion.  Preoperative antibiotics were given.  An incision was made below the right clavicle. A subcutaneous pocket was formed. The needles advanced into the subclavian vein using the Seldinger technique without difficulty. A guidewire was then advanced into the right atrium under fluoroscopic guidance.  Ectopia was not noted. An introducer and peel-away sheath were placed over the guidewire. The catheter was then inserted through the peel-away sheath and the peel-away sheath was removed.  A spot film was performed to confirm the position. The catheter was then attached to the port and the port placed in subcutaneous pocket. Adequate positioning was confirmed by fluoroscopy. Hemostasis was confirmed, and the port was secured with 2-0 prolene sutures.  Good backflow of blood was noted on aspiration of the port. The port was flushed with heparin flush. Subcutaneous layer was reapproximated using a 3-0 Vicryl interrupted  suture. The skin was closed using a 4-0 Vicryl subcuticular suture. Dermabond was applied.    All tape and needle counts were correct at the end of the procedure. The patient was transferred to PACU in stable condition. A chest x-ray will be performed at that time.  Algis Greenhouse, MD [facility] [address] [place], [place] [address] [phone] (office)

## 2023-10-12 NOTE — Progress Notes (Addendum)
 [facility] in place, CXR looks good without ptx. Could not get her niece on the phone earlier. Spoke with patient once she was awake and let her know things went well and CXR looked good.   Algis Greenhouse, MD [facility] [address] [place] [place], [place] [address] [phone] (office)

## 2023-10-13 ENCOUNTER — Encounter (HOSPITAL_COMMUNITY): Payer: Self-pay | Admitting: General Surgery

## 2023-10-18 ENCOUNTER — Inpatient Hospital Stay: Payer: 59 | Admitting: Hematology

## 2023-10-24 NOTE — Progress Notes (Signed)
 [facility] [address], [place] [address]    Clinic Day:  10/25/2023  Referring physician: Wilmon Pali, FNP  Patient Care Team: Wilmon Pali, FNP as PCP - General (Family Medicine) Nyoka Cowden, MD as Consulting Physician (Pulmonary Disease)   ASSESSMENT & PLAN:   Assessment: 1.  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  Mass abuts anterior pleural surface.  There is mild cortical erosion along the inner aspect of the left anterior third rib abutting the left upper lobe mass consistent with direct invasion.  Indeterminate 5 mm subsolid LUL nodule too small to characterize. - Denies any fevers, night sweats or weight loss.  No hemoptysis. - She reports burning all over the body.  Also reports left chest wall pain, aching type preventing her to sleep on the left side.  She is taking Tylenol as needed which helps. - Guardant360: PIK3CA [id number], PTEN deletion (exon 1), T p53, BLM - PET scan (09/01/2023): 7 cm anterior left upper lobe mass invading left chest wall with destruction of the left third anterior rib.  No adenopathy or distant metastatic disease.  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 - MRI brain (08/31/2022): No evidence of metastatic disease. - Left upper lobe biopsy (09/11/2023): Invasive moderately differentiated squamous cell carcinoma.   2. Social/Family History: -Lives at home alone and does not work. Tobacco use of 1 cigarette a day since her 20's. No chemical exposures.  -Mother had uterine cancer. 2 sisters had breast cancer.     Plan: 1.  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 Dorris Fetch.  She was recommended to have neoadjuvant chemoimmunotherapy  followed by reevaluation. - We discussed chemotherapy with carboplatin and paclitaxel with nivolumab every 3 weeks for 4 cycles. - She had port placed on 10/12/2023. - We discussed side effects in detail.  We have given literature.  We will start as early as next week.  RTC 1 week after start of therapy to see if there is any worsening of pain.   2.  Left chest wall pain: - She reports oxycodone 5 mg is not helping completely. - Will increase pain medication to Percocet 10/325 every 6 hours as needed. - If no improvement will consider radiation therapy.   3.  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 - I have ordered CT soft tissue neck and ENT evaluation.    Orders Placed This Encounter  Procedures   Magnesium    Standing Status:   Future    Expected Date:   10/25/2023    Expiration Date:   10/24/2024   CBC with Differential    Standing Status:   Future    Expected Date:   10/25/2023    Expiration Date:   10/24/2024   Comprehensive metabolic panel    Standing Status:   Future    Expected Date:   10/25/2023    Expiration Date:   10/24/2024      I,Katie Daubenspeck,acting as a scribe for Doreatha Massed, MD.,have documented all relevant documentation on the behalf of Doreatha Massed, MD,as directed by  Vern Claude  Ellin Saba, MD while in the presence of Doreatha Massed, MD.   I, Doreatha Massed MD, have reviewed the above documentation for accuracy and completeness, and I agree with the above.   Doreatha Massed, MD   3/12/20256:18 PM  CHIEF COMPLAINT:   Diagnosis: LUL squamous cell lung cancer   Cancer Staging  Primary squamous cell carcinoma of upper lobe of left lung [facility]) Staging form: Lung, AJCC V9 - Clinical stage from 09/20/2023: Stage IIB (cT3, cN0, cM0) - Unsigned    Prior Therapy: none  Current Therapy: Neoadjuvant chemoimmunotherapy   HISTORY OF PRESENT  ILLNESS:   Oncology History  Primary squamous cell carcinoma of upper lobe of left lung (HCC)  09/20/2023 Initial Diagnosis   Primary squamous cell carcinoma of upper lobe of left lung (HCC)   10/25/2023 -  Chemotherapy   Patient is on Treatment Plan : LUNG Carboplatin + Paclitaxel q21d        INTERVAL HISTORY:   Denise Rivas is a 63 y.o. female presenting to clinic today for follow up of LUL squamous cell lung cancer. She was last seen by me on 09/21/23 in consultation.  Of note, she had port placed on 10/12/23.  Today, she states that she is doing well overall. Her appetite level is at 20%. Her energy level is at 7 5%.  PAST MEDICAL HISTORY:   Past Medical History: Past Medical History:  Diagnosis Date   Anxiety    Hypercholesteremia    Hypertension    Lung cancer (HCC)    Neurofibromatosis, type 1 [facility])    saw Dermatology [facility] 2023    Surgical History: Past Surgical History:  Procedure Laterality Date   ABDOMINAL HYSTERECTOMY     PORTACATH PLACEMENT Right 10/12/2023   Procedure: INSERTION PORT-A-CATH;  Surgeon: Lucretia Roers, MD;  Location: [facility];  Service: General;  Laterality: Right;    Social History: Social History   Socioeconomic History   Marital status: Single    Spouse name: Not on file   Number of children: Not on file   Years of education: Not on file   Highest education level: Not on file  Occupational History   Not on file  Tobacco Use   Smoking status: Some Days    Types: Cigarettes   Smokeless tobacco: Never  Vaping Use   Vaping status: Never Used  Substance and Sexual Activity   Alcohol use: Not Currently   Drug use: Never   Sexual activity: Not on file  Other Topics Concern   Not on file  Social History Narrative   Not on file   Social Drivers of Health   Financial Resource Strain: Not on file  Food Insecurity: Not on file  Transportation Needs: Not on file  Physical Activity: Not on file  Stress: Not on file  Social  Connections: Unknown (06/14/2022)   Received from [facility], [facility]   Social Network    Social Network: Not on file  Intimate Partner Violence: Unknown (06/14/2022)   Received from [employer], [facility]   HITS    Physically Hurt: Not on file    Insult or Talk Down To: Not on file    Threaten Physical Harm: Not on file    Scream or Curse: Not on file    Family History: No family history on file.  Current Medications:  Current Outpatient Medications:    oxyCODONE-acetaminophen (PERCOCET) 10-325 MG tablet, Take 1 tablet by mouth every 4 (four) hours as needed for pain., Disp: 60  tablet, Rfl: 0   amLODipine (NORVASC) 5 MG tablet, Take 5 mg by mouth in the morning., Disp: , Rfl:    aspirin EC 81 MG tablet, Take 81 mg by mouth in the morning., Disp: , Rfl:    fluticasone (FLONASE) 50 MCG/ACT nasal spray, Place 1 spray into both nostrils daily as needed for allergies., Disp: , Rfl:    gabapentin (NEURONTIN) 300 MG capsule, Take 1 capsule (300 mg total) by mouth 3 (three) times daily. (Patient taking differently: Take 300 mg by mouth See admin instructions. Take 1 capsule (300 mg) by mouth scheduled at bedtime, may take up to 2 additional dose if needed for pain.), Disp: 90 capsule, Rfl: 2   hydrOXYzine (ATARAX) 25 MG tablet, Take 25 mg by mouth daily as needed for anxiety., Disp: , Rfl:    ibuprofen (ADVIL) 200 MG tablet, Take 400 mg by mouth every 8 (eight) hours as needed (pain)., Disp: , Rfl:    mirtazapine (REMERON SOL-TAB) 45 MG disintegrating tablet, Take 45 mg by mouth at bedtime., Disp: , Rfl:    naproxen sodium (ALEVE) 220 MG tablet, Take 220-440 mg by mouth 2 (two) times daily as needed (pain.)., Disp: , Rfl:    ondansetron (ZOFRAN) 4 MG tablet, Take 1 tablet (4 mg total) by mouth every 8 (eight) hours as needed., Disp: 30 tablet, Rfl: 1   oxyCODONE (ROXICODONE) 5 MG immediate release tablet, Take 1 tablet (5 mg total) by mouth every 4 (four) hours as needed for  severe pain (pain score 7-10) or breakthrough pain., Disp: 4 tablet, Rfl: 0   pravastatin (PRAVACHOL) 40 MG tablet, Take 40 mg by mouth daily., Disp: , Rfl:    sertraline (ZOLOFT) 100 MG tablet, Take 100 mg by mouth in the morning., Disp: , Rfl:    Allergies: No Known Allergies  REVIEW OF SYSTEMS:   Review of Systems  Constitutional:  Positive for fatigue. Negative for chills and fever.  HENT:   Negative for lump/mass, mouth sores, nosebleeds, sore throat and trouble swallowing.   Eyes:  Negative for eye problems.  Respiratory:  Negative for cough and shortness of breath.   Cardiovascular:  Positive for chest pain. Negative for leg swelling and palpitations.  Gastrointestinal:  Negative for abdominal pain, constipation, diarrhea, nausea and vomiting.  Genitourinary:  Negative for bladder incontinence, difficulty urinating, dysuria, frequency, hematuria and nocturia.   Musculoskeletal:  Negative for arthralgias, back pain, flank pain, myalgias and neck pain.  Skin:  Negative for itching and rash.  Neurological:  Negative for dizziness, headaches and numbness.  Hematological:  Does not bruise/bleed easily.  Psychiatric/Behavioral:  Positive for sleep disturbance. Negative for depression and suicidal ideas. The patient is nervous/anxious.   All other systems reviewed and are negative.    VITALS:   Pulse (!) 114, temperature 98 F (36.7 C), temperature source Oral, resp. rate 16, height 5' 1.22" (1.555 m), weight 119 lb 0.8 oz (54 kg), SpO2 99%.  Wt Readings from Last 3 Encounters:  10/25/23 119 lb 0.8 oz (54 kg)  10/12/23 124 lb 3.2 oz (56.3 kg)  10/10/23 124 lb 3.2 oz (56.3 kg)    Body mass index is 22.33 kg/m.  Performance status (ECOG): 1 - Symptomatic but completely ambulatory  PHYSICAL EXAM:   Physical Exam Vitals and nursing note reviewed. Exam conducted with a chaperone present.  Constitutional:      Appearance: Normal appearance.  Cardiovascular:     Rate and  Rhythm: Normal rate and regular rhythm.  Pulses: Normal pulses.     Heart sounds: Normal heart sounds.  Pulmonary:     Effort: Pulmonary effort is normal.     Breath sounds: Normal breath sounds.  Abdominal:     Palpations: Abdomen is soft. There is no hepatomegaly, splenomegaly or mass.     Tenderness: There is no abdominal tenderness.  Musculoskeletal:     Right lower leg: No edema.     Left lower leg: No edema.  Lymphadenopathy:     Cervical: No cervical adenopathy.     Right cervical: No superficial, deep or posterior cervical adenopathy.    Left cervical: No superficial, deep or posterior cervical adenopathy.     Upper Body:     Right upper body: No supraclavicular or axillary adenopathy.     Left upper body: No supraclavicular or axillary adenopathy.  Neurological:     General: No focal deficit present.     Mental Status: She is alert and oriented to person, place, and time.  Psychiatric:        Mood and Affect: Mood normal.        Behavior: Behavior normal.     LABS:   CBC     Component Value Date/Time   WBC 10.7 (H) 09/11/2023 0957   RBC 4.19 09/11/2023 0957   HGB 12.0 09/11/2023 0957   HCT 37.5 09/11/2023 0957   PLT 470 (H) 09/11/2023 0957   MCV 89.5 09/11/2023 0957   MCH 28.6 09/11/2023 0957   MCHC 32.0 09/11/2023 0957   RDW 15.4 09/11/2023 0957   LYMPHSABS 1.6 10/06/2014 0915   MONOABS 0.6 10/06/2014 0915   EOSABS 0.1 10/06/2014 0915   BASOSABS 0.0 10/06/2014 0915    CMP      Component Value Date/Time   NA 136 08/13/2023 1303   K 3.2 (L) 08/13/2023 1303   CL 104 08/13/2023 1303   CO2 22 08/13/2023 1303   GLUCOSE 91 08/13/2023 1303   BUN 14 08/13/2023 1303   CREATININE 0.61 08/13/2023 1303   CALCIUM 9.8 08/13/2023 1303   PROT 7.6 10/26/2020 0843   ALBUMIN 3.7 10/26/2020 0843   AST 14 (L) 10/26/2020 0843   ALT 14 10/26/2020 0843   ALKPHOS 101 10/26/2020 0843   BILITOT 0.4 10/26/2020 0843   GFRNONAA >60 08/13/2023 1303   GFRAA >60  02/18/2020 0932     No results found for: "CEA1", "CEA" / No results found for: "CEA1", "CEA" No results found for: "PSA1" No results found for: "UYQ034" No results found for: "CAN125"  No results found for: "TOTALPROTELP", "ALBUMINELP", "A1GS", "A2GS", "BETS", "BETA2SER", "GAMS", "MSPIKE", "SPEI" No results found for: "TIBC", "FERRITIN", "IRONPCTSAT" No results found for: "LDH"   STUDIES:   DG Chest Port 1 View Result Date: 10/12/2023 CLINICAL DATA:  Port-A-Cath placement EXAM: PORTABLE CHEST 1 VIEW COMPARISON:  X-ray 08/13/2023.  PET-CT scan 09/01/2023 FINDINGS: Right subclavian chest port in place with tip at the SVC right atrial junction. No pneumothorax. No edema, effusion or consolidation. Normal cardiopericardial silhouette. Known large left upper lobe lung mass. IMPRESSION: Right subclavian chest port in place.  No pneumothorax. Electronically Signed   By: Karen Kays M.D.   On: 10/12/2023 12:15   DG C-Arm 1-60 Min-No Report Result Date: 10/12/2023 Fluoroscopy was utilized by the requesting physician.  No radiographic interpretation.

## 2023-10-24 NOTE — Progress Notes (Signed)
 START ON PATHWAY REGIMEN - Non-Small Cell Lung     A cycle is every 21 days:     Nivolumab      Paclitaxel      Carboplatin   **Always confirm dose/schedule in your pharmacy ordering system**  Patient Characteristics: Preoperative or Nonsurgical Candidate (Clinical Staging), Stage IIA or Stage IIB (N0-1 only), Surgical Candidate, EGFR Negative and ALK Negative, AND Perioperative Chemoimmunotherapy Preferred, Squamous Cell, Nivolumab Preferred Therapeutic Status: Preoperative or Nonsurgical Candidate (Clinical Staging) AJCC T Category: cT3 AJCC N Category: cN0 AJCC M Category: cM0 AJCC 9 Stage Grouping: IIB Check here if patient was staged using an edition other than AJCC Staging 9th Edition: false ALK Fusion/Rearrangement Status: Negative Preferred Treatment: Perioperative Chemoimmunotherapy Preferred EGFR Mutation Status: Negative/Wild Type Intent of Therapy: Curative Intent, Discussed with Patient

## 2023-10-25 ENCOUNTER — Inpatient Hospital Stay

## 2023-10-25 ENCOUNTER — Inpatient Hospital Stay: Attending: Hematology | Admitting: Hematology

## 2023-10-25 VITALS — HR 114 | Temp 98.0°F | Resp 16 | Ht 61.22 in | Wt 119.0 lb

## 2023-10-25 DIAGNOSIS — Z79899 Other long term (current) drug therapy: Secondary | ICD-10-CM | POA: Diagnosis not present

## 2023-10-25 DIAGNOSIS — Z5111 Encounter for antineoplastic chemotherapy: Secondary | ICD-10-CM | POA: Insufficient documentation

## 2023-10-25 DIAGNOSIS — C3412 Malignant neoplasm of upper lobe, left bronchus or lung: Secondary | ICD-10-CM | POA: Diagnosis present

## 2023-10-25 DIAGNOSIS — E876 Hypokalemia: Secondary | ICD-10-CM | POA: Insufficient documentation

## 2023-10-25 DIAGNOSIS — F17210 Nicotine dependence, cigarettes, uncomplicated: Secondary | ICD-10-CM | POA: Insufficient documentation

## 2023-10-25 MED ORDER — OXYCODONE-ACETAMINOPHEN 10-325 MG PO TABS: 1.0000 | ORAL_TABLET | ORAL | 0 refills | Status: DC | PRN

## 2023-10-25 MED ORDER — OXYCODONE HCL 5 MG PO TABS
10.0000 mg | ORAL_TABLET | Freq: Once | ORAL | Status: AC
  Administered 2023-10-25: 10 mg via ORAL
  Filled 2023-10-25: qty 2

## 2023-10-25 NOTE — Patient Instructions (Signed)
 [facility] - A DEPT OF [facility]  Discharge Instructions: Thank you for choosing [facility] to provide your oncology and hematology care.  If you have a lab appointment with the Cancer Center - please note that after April 8th, 2024, all labs will be drawn in the cancer center.  You do not have to check in or register with the main entrance as you have in the past but will complete your check-in in the cancer center.  Wear comfortable clothing and clothing appropriate for easy access to any Portacath or PICC line.   We strive to give you quality time with your provider. You may need to reschedule your appointment if you arrive late (15 or more minutes).  Arriving late affects you and other patients whose appointments are after yours.  Also, if you miss three or more appointments without notifying the office, you may be dismissed from the clinic at the provider's discretion.      For prescription refill requests, have your pharmacy contact our office and allow 72 hours for refills to be completed.    Today you received the following folloup visit with pain management of Oxycodone.   Oxycodone Capsules or Tablets What is this medication? OXYCODONE (ox i KOE done) treats severe pain. It is prescribed when other pain medications do not work well enough or cannot be tolerated. It works by blocking pain signals in the brain. It belongs to a group of medications called opioids. This medicine may be used for other purposes; ask your health care provider or pharmacist if you have questions. COMMON BRAND NAME(S): Dazidox, Endocodone, Oxaydo, OXECTA, OxyIR, Percolone, Roxicodone, Roxybond What should I tell my care team before I take this medication? They need to know if you have any of these conditions: Brain tumor Frequently drink alcohol Head injury Heart disease History of pancreatitis Kidney disease Liver disease Low adrenal gland function Lung  or breathing disease, such as asthma Seizures Stomach or intestine problems Substance use disorder Taken an MAOI such as Marplan, Nardil, or Parnate in the last 14 days An unusual or allergic reaction to oxycodone, other medications, foods, dyes, or preservatives Pregnant or trying to get pregnant Breastfeeding How should I use this medication? Take this medication by mouth with water. Take it as directed on the prescription label at the same time every day. You can take it with or without food. If it upsets your stomach, take it with food. Keep taking it unless your care team tells you to stop. Some brands of this medication, like Oxaydo, have special instructions. Ask your care team or pharmacist if these directions are for you: Do not cut, crush or chew this medication. Do not wet, soak, or lick the tablet before you take it. A special MedGuide will be given to you by the pharmacist with each prescription and refill. Be sure to read this information carefully each time. Talk to your care team regarding the use of this medication in children. Special care may be needed. Overdosage: If you think you have taken too much of this medicine contact a poison control center or emergency room at once. NOTE: This medicine is only for you. Do not share this medicine with others. What if I miss a dose? If you miss a dose, take it as soon as you can. If it is almost time for your next dose, take only that dose. Do not take double or extra doses. What may interact  with this medication? Do not take this medication with any of the following: Safinamide This medication may interact with the following: Alcohol Antihistamines for allergy, cough, and cold Atropine Certain antivirals for HIV or hepatitis Certain antibiotics, such as clarithromycin, erythromycin, linezolid, rifampin Certain medications for anxiety or sleep Certain medications for bladder problems, such as oxybutynin, tolterodine Certain  medications for depression, such as amitriptyline, fluoxetine, sertraline Certain medications for fungal infections, such as ketoconazole, itraconazole, posaconazole Certain medications for migraine headache, such as almotriptan, eletriptan, frovatriptan, naratriptan, rizatriptan, sumatriptan, zolmitriptan Certain medications for nausea or vomiting, such as dolasetron, granisetron, ondansetron, palonosetron Certain medications for Parkinson disease, such as benztropine, trihexyphenidyl Certain medications for seizures, such as carbamazepine, phenobarbital, phenytoin, primidone Certain medications for stomach problems, such as dicyclomine, hyoscyamine Certain medications for travel sickness, such as scopolamine Diuretics General anesthetics, such as halothane, isoflurane, methoxyflurane, propofol Ipratropium MAOIs, such as Marplan, Nardil, and Parnate Medications that relax muscles Methylene blue Other opioid medications for pain or cough Phenothiazines, such as chlorpromazine, mesoridazine, prochlorperazine, thioridazine This list may not describe all possible interactions. Give your health care provider a list of all the medicines, herbs, non-prescription drugs, or dietary supplements you use. Also tell them if you smoke, drink alcohol, or use illegal drugs. Some items may interact with your medicine. What should I watch for while using this medication? Tell your care team if your pain does not go away, if it gets worse, or if you have new or a different type of pain. You may develop tolerance to this medication. Tolerance means that you will need a higher dose of the medication for pain relief. Tolerance is normal and is expected if you take this medication for a long time. Taking this medication with other substances that cause drowsiness, such as alcohol, benzodiazepines, or other opioids can cause serious side effects. Give your care team a list of all medications you use. They will tell you  how much medication to take. Do not take more medication than directed. Call emergency services if you have problems breathing or staying awake. Long term use of this medication may cause your brain and body to depend on it. This can happen even when used as directed by your care team. You and your care team will work together to determine how long you will need to take this medication. If your care team wants you to stop this medication, the dose will be slowly lowered over time to reduce the risk of side effects. Naloxone is an emergency medication used for an opioid overdose. An overdose can happen if you take too much of an opioid. It can also happen if an opioid is taken with some other medications or substances such as alcohol. Know the symptoms of an overdose, such as trouble breathing, unusually tired or sleepy, or not being able to respond or wake up. Make sure to tell caregivers and close contacts where your naloxone is stored. Make sure they know how to use it. After naloxone is given, the person giving it must call emergency services. Naloxone is a temporary treatment. Repeat doses may be needed. This medication may affect your coordination, reaction time, or judgment. Do not drive or operate machinery until you know how this medication affects you. Sit up or stand slowly to reduce the risk of dizzy or fainting spells. Drinking alcohol with this medication can increase the risk of these side effects. This medication will cause constipation. If you do not have a bowel movement for 3 days,  call your care team. Your mouth may get dry. Chewing sugarless gum or sucking hard candy and drinking plenty of water may help. Contact your care team if the problem does not go away or is severe. Talk to your care team if you may be pregnant. Prolonged use of this medication during pregnancy can cause temporary withdrawal in a newborn. Talk to your care team before breastfeeding. Changes to your treatment plan  may be needed. If you breastfeed while taking this medication, seek medical care right away if you notice the child has slow or noisy breathing, is unusually sleepy or not able to wake up, or is limp. Long-term use of this medication may cause infertility. Talk to your care team if you are concerned about your fertility. What side effects may I notice from receiving this medication? Side effects that you should report to your care team as soon as possible: Allergic reactions--skin rash, itching, hives, swelling of the face, lips, tongue, or throat CNS depression--slow or shallow breathing, shortness of breath, feeling faint, dizziness, confusion, difficulty staying awake Low adrenal gland function--nausea, vomiting, loss of appetite, unusual weakness or fatigue, dizziness Low blood pressure--dizziness, feeling faint or lightheaded, blurry vision Side effects that usually do not require medical attention (report to your care team if they continue or are bothersome): Constipation Dizziness Drowsiness Dry mouth Headache Nausea Vomiting This list may not describe all possible side effects. Call your doctor for medical advice about side effects. You may report side effects to FDA at 1-800-FDA-1088. Where should I keep my medication? Keep out of the reach of children and pets. This medication can be abused. Keep it in a safe place to protect it from theft. Do not share it with anyone. It is only for you. Selling or giving away this medication is dangerous and against the law. Store at [employer] C (77 degrees F). Protect from light and moisture. Keep the container tightly closed. Get rid of any unused medication after the expiration date. This medication may cause harm and death if it is taken by other adults, children, or pets. It is important to get rid of the medication as soon as you no longer need it, or it is expired. You can do this in two ways: Take the medication to a medication take-back  program. Check with your pharmacy or law enforcement to find a location. If you cannot return the medication, flush it down the toilet. NOTE: This sheet is a summary. It may not cover all possible information. If you have questions about this medicine, talk to your doctor, pharmacist, or health care provider.  2024 Elsevier/Gold Standard (2022-09-01 00:00:00)   To help prevent nausea and vomiting after your treatment, we encourage you to take your nausea medication as directed.  BELOW ARE SYMPTOMS THAT SHOULD BE REPORTED IMMEDIATELY: *FEVER GREATER THAN 100.4 F (38 C) OR HIGHER *CHILLS OR SWEATING *NAUSEA AND VOMITING THAT IS NOT CONTROLLED WITH YOUR NAUSEA MEDICATION *UNUSUAL SHORTNESS OF BREATH *UNUSUAL BRUISING OR BLEEDING *URINARY PROBLEMS (pain or burning when urinating, or frequent urination) *BOWEL PROBLEMS (unusual diarrhea, constipation, pain near the anus) TENDERNESS IN MOUTH AND THROAT WITH OR WITHOUT PRESENCE OF ULCERS (sore throat, sores in mouth, or a toothache) UNUSUAL RASH, SWELLING OR PAIN  UNUSUAL VAGINAL DISCHARGE OR ITCHING   Items with * indicate a potential emergency and should be followed up as soon as possible or go to the Emergency Department if any problems should occur.  Please show the CHEMOTHERAPY ALERT CARD or  IMMUNOTHERAPY ALERT CARD at check-in to the Emergency Department and triage nurse.  Should you have questions after your visit or need to cancel or reschedule your appointment, please contact [facility]  - A DEPT OF [facility] [phone]  and follow the prompts.  Office hours are 8:00 a.m. to 4:30 p.m. Monday - Friday. Please note that voicemails left after 4:00 p.m. may not be returned until the following business day.  We are closed weekends and major holidays. You have access to a nurse at all times for urgent questions. Please call the main number to the clinic [phone] and follow the prompts.  For any  non-urgent questions, you may also contact your provider using MyChart. We now offer e-Visits for anyone 58 and older to request care online for non-urgent symptoms. For details visit [email or web address].   Also download the MyChart app! Go to the app store, search "MyChart", open the app, select [place], and log in with your MyChart username and password.

## 2023-10-25 NOTE — Progress Notes (Signed)
 Patient c/o pain during office visit. Order given for Oxycodone 10mg  by Dr Ellin Saba. Patient verbalized she had a ride home prior to taking oral medication. Patient instructed not to drive for at least 6 hours, verbalized understanding.

## 2023-10-25 NOTE — Patient Instructions (Addendum)
 [facility] at [facility] Discharge Instructions   You were seen and examined today by Dr. Ellin Saba.  He discussed with you treatment for your lung cancer. Treatment consists of two chemotherapy drugs and an immunotherapy drug. Treatment is every 3 weeks.   We will see you back one week after treatment to see how you're doing.   Return as scheduled.    Thank you for choosing [facility] at [facility] to provide your oncology and hematology care.  To afford each patient quality time with our provider, please arrive at least 15 minutes before your scheduled appointment time.   If you have a lab appointment with the Cancer Center please come in thru the Main Entrance and check in at the main information desk.  You need to re-schedule your appointment should you arrive 10 or more minutes late.  We strive to give you quality time with our providers, and arriving late affects you and other patients whose appointments are after yours.  Also, if you no show three or more times for appointments you may be dismissed from the clinic at the providers discretion.     Again, thank you for choosing [facility].  Our hope is that these requests will decrease the amount of time that you wait before being seen by our physicians.       _____________________________________________________________  Should you have questions after your visit to [facility], please contact our office at [phone] and follow the prompts.  Our office hours are 8:00 a.m. and 4:30 p.m. Monday - Friday.  Please note that voicemails left after 4:00 p.m. may not be returned until the following business day.  We are closed weekends and major holidays.  You do have access to a nurse 24-7, just call the main number to the clinic [phone] and do not press any options, hold on the line and a nurse will answer the phone.    For prescription refill requests, have  your pharmacy contact our office and allow 72 hours.    Due to Covid, you will need to wear a mask upon entering the hospital. If you do not have a mask, a mask will be given to you at the Main Entrance upon arrival. For doctor visits, patients may have 1 support person age 62 or older with them. For treatment visits, patients can not have anyone with them due to social distancing guidelines and our immunocompromised population.

## 2023-10-26 ENCOUNTER — Other Ambulatory Visit: Payer: Self-pay

## 2023-10-26 DIAGNOSIS — C3412 Malignant neoplasm of upper lobe, left bronchus or lung: Secondary | ICD-10-CM

## 2023-10-26 NOTE — Patient Instructions (Addendum)
 [facility] Chemotherapy Teaching    You have been diagnosed with Stage 2 Lung cancer.  You will receive the chemotherapies Taxol and Carboplatin and an immunotherapy called Opdivo.  This treatment will be given every 3 weeks.  The intention of treatment is curative.    You will see the doctor regularly throughout treatment.  We will obtain blood work from you prior to every treatment and monitor your results to make sure it is safe to give your treatment. The doctor monitors your response to treatment by the way you are feeling, your blood work, and by obtaining scans periodically.  There will be wait times while you are here for treatment.  It will take about 30 minutes to 1 hour for your lab work to result.  Then there will be wait times while pharmacy mixes your medications.    Medications you will receive in the clinic prior to your chemotherapy medications:   Aloxi:  ALOXI is used in adults to help prevent the nausea and vomiting that happens with certain chemotherapy drugs.  Aloxi is a long acting medication, and will remain in your system for about 2 days.    Dexamethasone:  This is a steroid given prior to chemotherapy to help prevent allergic reactions; it may also help prevent and control nausea and diarrhea.    Pepcid:  This medication is a histamine blocker that helps prevent and allergic reaction to your chemotherapy.    Benadryl:  This is a histamine blocker (different from the Pepcid) that helps prevent allergic/infusion reactions to your chemotherapy. This medication may cause dizziness/drowsiness.   Emend:  Emend is an anti-nausea medication used together with Aloxi to prevent nausea and vomiting that may be caused by chemotherapy.   Paclitaxel (Taxol)  About This Drug Paclitaxel is a drug used to treat cancer. It is given in the vein (IV).  This will take 1 hour to infuse.  This first infusion will take longer to infuse because it is increased slowly to  monitor for reactions.  The nurse will be in the room with you for the first 15 minutes of the first infusion.  Possible Side Effects   Hair loss. Hair loss is often temporary, although with certain medicine, hair loss can sometimes be permanent. Hair loss may happen suddenly or gradually. If you lose hair, you may lose it from your head, face, armpits, pubic area, chest, and/or legs. You may also notice your hair getting thin.   Swelling of your legs, ankles and/or feet (edema)   Flushing   Nausea and throwing up (vomiting)   Loose bowel movements (diarrhea)   Bone marrow depression. This is a decrease in the number of white blood cells, red blood cells, and platelets. This may raise your risk of infection, make you tired and weak (fatigue), and raise your risk of bleeding.   Effects on the nerves are called peripheral neuropathy. You may feel numbness, tingling, or pain in your hands and feet. It may be hard for you to button your clothes, open jars, or walk as usual. The effect on the nerves may get worse with more doses of the drug. These effects get better in some people after the drug is stopped but it does not get better in all people.   Changes in your liver function   Bone, joint and muscle pain   Abnormal EKG   Allergic reaction: Allergic reactions, including anaphylaxis are rare but may happen in some patients. Signs of allergic  reaction to this drug may be swelling of the face, feeling like your tongue or throat are swelling, trouble breathing, rash, itching, fever, chills, feeling dizzy, and/or feeling that your heart is beating in a fast or not normal way. If this happens, do not take another dose of this drug. You should get urgent medical treatment.   Infection   Changes in your kidney function.  Note: Each of the side effects above was reported in 20% or greater of patients treated with paclitaxel. Not all possible side effects are included above.  Warnings and  Precautions   Severe allergic reactions   Severe bone marrow depression  Treating Side Effects   To help with hair loss, wash with a mild shampoo and avoid washing your hair every day.   Avoid rubbing your scalp, instead, pat your hair or scalp dry   Avoid coloring your hair   Limit your use of hair spray, electric curlers, blow dryers, and curling irons.   If you are interested in getting a wig, talk to your nurse. You can also call the [employer] at 800-ACS-2345 to find out information about the "Look Good, Feel Better" program close to where you live. It is a free program where women getting chemotherapy can learn about wigs, turbans and scarves as well as makeup techniques and skin and nail care.   Ask your doctor or nurse about medicines that are available to help stop or lessen diarrhea and/or nausea.   To help with nausea and vomiting, eat small, frequent meals instead of three large meals a day. Choose foods and drinks that are at room temperature. Ask your nurse or doctor about other helpful tips and medicine that is available to help or stop lessen these symptoms.   If you get diarrhea, eat low-fiber foods that are high in protein and calories and avoid foods that can irritate your digestive tracts or lead to cramping. Ask your nurse or doctor about medicine that can lessen or stop your diarrhea.   Mouth care is very important. Your mouth care should consist of routine, gentle cleaning of your teeth or dentures and rinsing your mouth with a mixture of 1/2 teaspoon of salt in 8 ounces of water or  teaspoon of baking soda in 8 ounces of water. This should be done at least after each meal and at bedtime.   If you have mouth sores, avoid mouthwash that has alcohol. Also avoid alcohol and smoking because they can bother your mouth and throat.   Drink plenty of fluids (a minimum of eight glasses per day is recommended).   Take your temperature as your doctor or nurse  tells you, and whenever you feel like you may have a fever.   Talk to your doctor or nurse about precautions you can take to avoid infections and bleeding.   Be careful when cooking, walking, and handling sharp objects and hot liquids.  Food and Drug Interactions   There are no known interactions of paclitaxel with food.   This drug may interact with other medicines. Tell your doctor and pharmacist about all the medicines and dietary supplements (vitamins, minerals, herbs and others) that you are taking at this time.   The safety and use of dietary supplements and alternative diets are often not known. Using these might affect your cancer or interfere with your treatment. Until more is known, you should not use dietary supplements or alternative diets without your cancer doctor's help.  When to Call the  Doctor  Call your doctor or nurse if you have any of the following symptoms and/or any new or unusual symptoms:   Fever of 100.4 F (38 C) or above   Chills   Redness, pain, warmth, or swelling at the IV site during the infusion   Signs of allergic reaction: swelling of the face, feeling like your tongue or throat are swelling, trouble breathing, rash, itching, fever, chills, feeling dizzy, and/or feeling that your heart is beating in a fast or not normal way   Feeling that your heart is beating in a fast or not normal way (palpitations)   Weight gain of 5 pounds in one week (fluid retention)   Decreased urine or very dark urine   Signs of liver problems: dark urine, pale bowel movements, bad stomach pain, feeling very tired and weak, unusual  itching, or yellowing of the eyes or skin   Heavy menstrual period that lasts longer than normal   Easy bruising or bleeding   Nausea that stops you from eating or drinking, and/or that is not relieved by prescribed medicines.   Loose bowel movements (diarrhea) more than 4 times a day or diarrhea with weakness or lightheadedness   Pain  in your mouth or throat that makes it hard to eat or drink   Lasting loss of appetite or rapid weight loss of five pounds in a week   Signs of peripheral neuropathy: numbness, tingling, or decreased feeling in fingers or toes; trouble walking or changes in the way you walk; or feeling clumsy when buttoning clothes, opening jars, or other routine activities   Joint and muscle pain that is not relieved by prescribed medicines   Extreme fatigue that interferes with normal activities   While you are getting this drug, please tell your nurse right away if you have any pain, redness, or swelling at the site of the IV infusion.   If you think you are pregnant.  Reproduction Warnings   Pregnancy warning: This drug may have harmful effects on the unborn child, it is recommended that effective methods of birth control should be used during your cancer treatment. Let your doctor know right away if you think you may be pregnant.   Breast feeding warning: Women should not breast feed during treatment because this drug could enter the breastmilk and cause harm to a breast feeding baby.   Carboplatin (Paraplatin, CBDCA)  About This Drug  Carboplatin is used to treat cancer. It is given in the vein (IV).  It will take 30 minutes to infuse.   Possible Side Effects   Bone marrow suppression. This is a decrease in the number of white blood cells, red blood cells, and platelets. This may raise your risk of infection, make you tired and weak (fatigue), and raise your risk of bleeding.   Nausea and vomiting (throwing up)   Weakness   Changes in your liver function   Changes in your kidney function   Electrolyte changes   Pain  Note: Each of the side effects above was reported in 20% or greater of patients treated with carboplatin. Not all possible side effects are included above.   Warnings and Precautions   Severe bone marrow suppression   Allergic reactions, including anaphylaxis are  rare but may happen in some patients. Signs of allergic reaction to this drug may be swelling of the face, feeling like your tongue or throat are swelling, trouble breathing, rash, itching, fever, chills, feeling dizzy, and/or feeling that your heart  is beating in a fast or not normal way. If this happens, do not take another dose of this drug. You should get urgent medical treatment.   Severe nausea and vomiting   Effects on the nerves are called peripheral neuropathy. This risk is increased if you are over the age of 110 or if you have received other medicine with risk of peripheral neuropathy. You may feel numbness, tingling, or pain in your hands and feet. It may be hard for you to button your clothes, open jars, or walk as usual. The effect on the nerves may get worse with more doses of the drug. These effects get better in some people after the drug is stopped but it does not get better in all people.   Blurred vision, loss of vision or other changes in eyesight   Decreased hearing   - Skin and tissue irritation including redness, pain, warmth, or swelling at the IV site if the drug leaks out of the vein and into nearby tissue.   Severe changes in your kidney function, which can cause kidney failure   Severe changes in your liver function, which can cause liver failure  Note: Some of the side effects above are very rare. If you have concerns and/or questions, please discuss them with your medical team.   Important Information   This drug may be present in the saliva, tears, sweat, urine, stool, vomit, semen, and vaginal secretions. Talk to your doctor and/or your nurse about the necessary precautions to take during this time.   Treating Side Effects   Manage tiredness by pacing your activities for the day.   Be sure to include periods of rest between energy-draining activities.   To decrease the risk of infection, wash your hands regularly.   Avoid close contact with people who  have a cold, the flu, or other infections.   Take your temperature as your doctor or nurse tells you, and whenever you feel like you may have a fever.   To help decrease the risk of bleeding, use a soft toothbrush. Check with your nurse before using dental floss.   Be very careful when using knives or tools.   Use an electric shaver instead of a razor.   Drink plenty of fluids (a minimum of eight glasses per day is recommended).   If you throw up or have loose bowel movements, you should drink more fluids so that you do not become dehydrated (lack of water in the body from losing too much fluid).   To help with nausea and vomiting, eat small, frequent meals instead of three large meals a day. Choose foods and drinks that are at room temperature. Ask your nurse or doctor about other helpful tips and medicine that is available to help stop or lessen these symptoms.   If you have numbness and tingling in your hands and feet, be careful when cooking, walking, and handling sharp objects and hot liquids.   Keeping your pain under control is important to your well-being. Please tell your doctor or nurse if you are experiencing pain.   Food and Drug Interactions   There are no known interactions of carboplatin with food.   This drug may interact with other medicines. Tell your doctor and pharmacist about all the prescription and over-the-counter medicines and dietary supplements (vitamins, minerals, herbs and others) that you are taking at this time. Also, check with your doctor or pharmacist before starting any new prescription or over-the-counter medicines, or dietary supplements  to make sure that there are no interactions.   When to Call the Doctor  Call your doctor or nurse if you have any of these symptoms and/or any new or unusual symptoms:   Fever of 100.4 F (38 C) or higher   Chills   Tiredness that interferes with your daily activities   Feeling dizzy or lightheaded   Easy  bleeding or bruising   Nausea that stops you from eating or drinking and/or is not relieved by prescribed medicines   Throwing up   Blurred vision or other changes in eyesight   Decrease in hearing or ringing in the ear   Signs of allergic reaction: swelling of the face, feeling like your tongue or throat are swelling, trouble breathing, rash, itching, fever, chills, feeling dizzy, and/or feeling that your heart is beating in a fast or not normal way. If this happens, call 911 for emergency care.   Signs of possible liver problems: dark urine, pale bowel movements, bad stomach pain, feeling very tired and weak, unusual itching, or yellowing of the eyes or skin   Decreased urine, or very dark urine   Numbness, tingling, or pain in your hands and feet   Pain that does not go away or is not relieved by prescribed medicine   While you are getting this drug, please tell your nurse right away if you have any pain, redness, or swelling at the site of the IV infusion, or if you have any new onset of symptoms, or if you just feel "different" from before when the infusion was started.   Reproduction Warnings   Pregnancy warning: This drug may have harmful effects on the unborn baby. Women of child bearing potential should use effective methods of birth control during your cancer treatment. Let your doctor know right away if you think you may be pregnant.   Breastfeeding warning: It is not known if this drug passes into breast milk. For this reason, women should not breastfeed during treatment because this drug could enter the breast milk and cause harm to a breastfeeding baby.   Fertility warning: Human fertility studies have not been done with this drug. Talk with your doctor or nurse if you plan to have children. Ask for information on sperm or egg banking.   Nivolumab (Opdivo)  About This Drug Nivolumab is used to treat cancer. It is given in the vein (IV). It will take 30 minutes to  infuse.   Possible Side Effects  Nausea and vomiting   Pain in your abdomen   Diarrhea (loose bowel movements)   Constipation (not able to move bowels)   Tiredness and weakness   Fever   Decreased appetite (decreased hunger)   Joint, muscle and bone pain   Back pain   Headache   Cough and trouble breathing   Upper respiratory tract infection   Urinary tract infection   Rash and itching  Note: Each of the side effects above was reported in 20% or greater of patients treated with nivolumab alone. Your side effects may be different or more severe if you receive nivolumab in combination with other chemotherapy agents. Not all possible side effects are included above.  Warnings and Precautions  This drug works with your immune system and can cause inflammation in any of your organs and tissues and can change how they work. This may put you at risk for developing serious medical problems which can be life-threatening. These side effects may require treatment with steroids at the  discretion of your doctor.   Inflammation (swelling) of the lungs which can be life-threatening. You may have a dry cough or trouble breathing.   Colitis, which is swelling (inflammation) in the colon. The symptoms are loose bowel movements (diarrhea), stomach cramping, and sometimes blood in the bowel movements.   Changes in your central nervous system can happen. The central nervous system is made up of your brain and spinal cord. You could feel extreme tiredness, agitation, confusion, hallucinations (see or hear things that are not there), trouble understanding or speaking, loss of control of your bowels or bladder, eyesight changes, numbness or lack of strength to your arms, legs, face, or body, and coma. If you start to have any of these symptoms let your doctor know right away.   Severe changes in your liver function, which can cause liver failure and be life-threatening.   This drug may affect  some of your hormone glands (especially the thyroid, adrenals, pituitary, and pancreas).   Blood sugar levels may change, and you may develop diabetes. If you already have diabetes, changes may need to be made to your diabetes medication.   Changes in your kidney function   Allergic skin reaction which can very rarely be life-threatening. You may develop blisters on your skin that are filled with fluid or a severe red rash all over your body that may be painful.   While you are getting this drug in your vein (IV), you may have a reaction to the drug. Sometimes you may be given medication to stop or lessen these side effects. Your nurse will check you closely for these signs: fever or shaking chills, flushing, facial swelling, feeling dizzy, headache, trouble breathing, rash, itching, chest tightness, or chest pain. These reactions may happen after your infusion. If this happens, call 911 for emergency care.   Increased risk of serious complications that can be life-threatening such as graft versus host disease (GVHD) in patients who undergo a stem cell transplant before or after receiving nivolumab.   Increased risk of organ rejection in patients who have received donor organs  Important Information  This drug may be present in the saliva, tears, sweat, urine, stool, vomit, semen, and vaginal secretions. Talk to your doctor and/or your nurse about the necessary precautions to take during this time.  Treating Side Effects  Manage tiredness by pacing your activities for the day.   Be sure to include periods of rest between energy-draining activities.   Get regular exercise. If you feel too tired to exercise vigorously, try taking a short walk.   To help decrease the risk of bleeding, use a soft toothbrush. Check with your nurse before using dental floss.   Be very careful when using knives or tools.   Use an electric shaver instead of a razor.   Drink plenty of fluids (a minimum of eight  glasses per day is recommended).   To help with nausea and vomiting, eat small, frequent meals instead of three large meals a day. Choose foods and drinks that are at room temperature.   If you throw up or have diarrhea, you should drink more fluids so that you do not become dehydrated (lack of water in the body from losing too much fluid).   If you have diarrhea, eat low-fiber foods that are high in protein and calories, and avoid foods that can irritate your digestive tracts or lead to cramping.   If you are not able to move your bowels, check with your doctor  or nurse before you use any enemas, laxatives, or suppositories.   Ask your doctor or nurse about medicine that is available to help stop or lessen diarrhea, constipation, and/or nausea/vomiting.   To help with decreased appetite, eat small, frequent meals. Eat foods high in calories and protein, such as meat, poultry, fish, dry beans, tofu, eggs, nuts, milk, yogurt, cheese, ice cream, pudding, and nutritional supplements.   Consider using sauces and spices to increase taste. Daily exercise, with your doctor's approval, may increase your appetite.   If you have diabetes, keep good control of your blood sugar level. Tell your nurse or your doctor if your glucose levels are higher or lower than normal.   Keeping your pain under control is important to your well-being. Please tell your doctor or nurse if you are experiencing pain.   If you get a rash do not put anything on it unless your doctor or nurse says you may. Keep the area around the rash clean and dry. Ask your doctor for medicine if your rash bothers you.   To help with itching, moisturize your skin several times a day.   Avoid sun exposure and apply sunscreen routinely when outdoors.   If you have numbness and tingling in your hands and feet, be careful when cooking, walking, and handling sharp objects and hot liquids.   Infusion reactions may happen after your infusion.  If this happens, call 911 for emergency care.  Food and Drug Interactions  There are no known interactions of nivolumab with food.  This drug may interact with other medicines. Tell your doctor and pharmacist about all the prescription and over-the-counter medicines and dietary supplements (vitamins, minerals, herbs, and others) that you are taking at this time. Also, check with your doctor or pharmacist before starting any new prescription or over-the-counter medicines, or dietary supplements to make sure that there are no interactions.  When to Call the Doctor Not all possible side effects are included. Some of these side effects, although rare, can be lifethreatening.  Lung problems:  Inflammation of the lungs  Cough  Trouble breathing  Upper respiratory tract infection  Call your doctor or nurse if you have any of these symptoms:  Wheezing or trouble breathing  New or worsening cough  Chest pain  Coughing up yellow, green, or blood mucus  Stomach problems:  Decreased appetite (decreased hunger)  Nausea and vomiting (throwing up)  Diarrhea (loose bowel movements)  Constipation (unable to move bowels)  Pain in your abdomen  Inflammation of your colon  Blood in your stool  Call your doctor or nurse if you have any of these symptoms:  Nausea that stops you from eating or drinking or is not relieved by prescribed medicines  Throwing up more than 3 times a day  Lasting loss of appetite or rapid weight loss of five pounds in a week  Diarrhea, 4 times in one day or diarrhea with lack of strength or a feeling of being dizzy  No bowel movement for 3 days or you feel uncomfortable  Pain in your abdomen that does not go away  Blood in your stool (bright red or black/tarry)  Liver problems:  Changes in your liver function  Call your doctor or nurse if you have any of these symptoms:  Yellowing of the eyes or skin  Dark urine  Pale bowel movements  Pain on the right side of your  abdomen that does not go away  Feeling very tired and weak  Unusual  itching  Easy bleeding or bruising  Hormone gland problems:  Changes in some of your hormone glands (especially the thyroid, adrenals, pituitary, and pancreas)  Blood sugar levels may change, and you may develop diabetes  Call your doctor or nurse if you have any of these symptoms:  Headache that does not go away  Tiredness and weakness that interferes with your daily activities  Feeling dizzy or lightheaded  Changes in mood or behavior such as irritability and/or feeling forgetful  Shakiness  Weight loss or weight gain  Nausea  Abnormal blood sugar  Unusual thirst or passing urine often  Feeling cold  Kidney problems:  Changes in your kidney function  Urinary tract infection  Call your doctor or nurse if you have any of these symptoms:  Decreased or very dark urine  Cloudy urine and/or urine that smells bad  Difficulty urinating  Pain or burning when you pass urine  Feeling like you have to pass urine often, but not much comes out when you do  Tender or heavy feeling in your lower abdomen  Pain on one side of your back under your ribs  Skin problems:  Rash and itching  Soreness of the mouth and throat  Allergic skin reaction  Call your doctor or nurse if you have any of these symptoms:  New rash and/or itching  Fluid-filled bumps/blisters  Rash that is not relieved by prescribed medicines  Red areas, white patches, or sores in your mouth that hurt  Inflammation of the brain:  Changes in your brain and spinal cord  Headache  Effects on the nerves  Call your doctor or nurse if you have any of these symptoms:  Headache that does not go away  Extreme tiredness, agitation, or confusion  Seizures  Hallucinations  Trouble understanding or speaking  Loss of control of bowels or bladder  Numbness or lack of strength to your arms, legs, face, or body  Numbness, tingling, pins, and needles, or pain in  your arms, hands, legs, or feet  Other problems:  Low red blood cells, and platelets  Fever  Inflammation of your eye and/or other changes in vision  Allergic reaction to the drug  Heart problems  Electrolyte changes  Muscle, bone, and joint pain  Call your doctor or nurse if you have any of these symptoms:  Fever of 100.4 F (38 C) or higher  Chills, flushing  Easy bleeding or bruising  Blurred vision or other changes in eyesight  Sensitivity to light  Feeling that your heart is beating fast or in a not normal way (palpitations)   Signs of infusion reaction: fever or shaking chills, flushing, facial swelling, feeling dizzy, headache, trouble breathing, rash, itching, chest tightness, or chest pain. If this happens, call 911 for emergency care.  Pain that does not go away, or is not relieved by prescribed medicines  Extreme muscle weakness  Reproduction Warnings  Pregnancy warning: This drug can have harmful effects on the unborn baby. Women of childbearing potential should use effective methods of birth control during your cancer treatment and for at least 5 months after stopping treatment. Let your doctor know right away if you think you may be pregnant.  Breastfeeding warning: It is not known if this drug passes into breast milk. For this reason, women should not breastfeed during treatment and for 5 months after stopping treatment because this drug could enter the breast milk and cause harm to a breastfeeding baby.  Fertility warning: Fertility studies have not  been done with this drug. Talk with your doctor or nurse if you plan to have children. Ask for information on sperm or egg banking.   SELF CARE ACTIVITIES WHILE RECEIVING CHEMOTHERAPY:  Hydration Increase your fluid intake 48 hours prior to treatment and drink at least 8 to 12 cups (64 ounces) of water/decaffeinated beverages per day after treatment. You can still have your cup of coffee or soda but these beverages do not  count as part of your 8 to 12 cups that you need to drink daily. No alcohol intake.  Medications Continue taking your normal prescription medication as prescribed.  If you start any new herbal or new supplements please let us know first to make sure it is safe.  Mouth Care Have teeth cleaned professionally before starting treatment. Keep dentures and partial plates clean. Use soft toothbrush and do not use mouthwashes that contain alcohol. Biotene is a good mouthwash that is available at most pharmacies or may be ordered by calling [phone]. Use warm salt water gargles (1 teaspoon salt per 1 quart warm water) before and after meals and at bedtime. If you need dental work, please let the doctor know before you go for your appointment so that we can coordinate the best possible time for you in regards to your chemo regimen. You need to also let your dentist know that you are actively taking chemo. We may need to do labs prior to your dental appointment.  Skin Care Always use sunscreen that has not expired and with SPF (Sun Protection Factor) of 50 or higher. Wear hats to protect your head from the sun. Remember to use sunscreen on your hands, ears, face, & feet.  Use good moisturizing lotions such as udder cream, eucerin, or even Vaseline. Some chemotherapies can cause dry skin, color changes in your skin and nails.    Avoid long, hot showers or baths. Use gentle, fragrance-free soaps and laundry detergent. Use moisturizers, preferably creams or ointments rather than lotions because the thicker consistency is better at preventing skin dehydration. Apply the cream or ointment within 15 minutes of showering. Reapply moisturizer at night, and moisturize your hands every time after you wash them.  Hair Loss (if your doctor says your hair will fall out)  If your doctor says that your hair is likely to fall out, decide before you begin chemo whether you want to wear a wig. You may want to shop before  treatment to match your hair color. Hats, turbans, and scarves can also camouflage hair loss, although some people prefer to leave their heads uncovered. If you go bare-headed outdoors, be sure to use sunscreen on your scalp. Cut your hair short. It eases the inconvenience of shedding lots of hair, but it also can reduce the emotional impact of watching your hair fall out. Don't perm or color your hair during chemotherapy. Those chemical treatments are already damaging to hair and can enhance hair loss. Once your chemo treatments are done and your hair has grown back, it's OK to resume dyeing or perming hair.  With chemotherapy, hair loss is almost always temporary. But when it grows back, it may be a different color or texture. In older adults who still had hair color before chemotherapy, the new growth may be completely gray.  Often, new hair is very fine and soft.  Infection Prevention Please wash your hands for at least 30 seconds using warm soapy water. Handwashing is the #1 way to prevent the spread of germs. Stay  away from sick people or people who are getting over a cold. If you develop respiratory systems such as green/yellow mucus production or productive cough or persistent cough let us know and we will see if you need an antibiotic. It is a good idea to keep a pair of gloves on when going into grocery stores/[employer] to decrease your risk of coming into contact with germs on the carts, etc. Carry alcohol hand gel with you at all times and use it frequently if out in public. If your temperature reaches 100.4 or higher please call the clinic and let us know.  If it is after hours or on the weekend please go to the ER if your temperature is over 100.4.  Please have your own personal thermometer at home to use.    Sex and bodily fluids If you are going to have sex, a condom must be used to protect the person that isn't taking chemotherapy. Chemo can decrease your libido (sex drive). For a few days  after chemotherapy, chemotherapy can be excreted through your bodily fluids.  When using the toilet please close the lid and flush the toilet twice.  Do this for a few day after you have had chemotherapy.   Effects of chemotherapy on your sex life Some changes are simple and won't last long. They won't affect your sex life permanently.  Sometimes you may feel: too tired not strong enough to be very active sick or sore  not in the mood anxious or low  Your anxiety might not seem related to sex. For example, you may be worried about the cancer and how your treatment is going. Or you may be worried about money, or about how you family are coping with your illness.  These things can cause stress, which can affect your interest in sex. It's important to talk to your partner about how you feel.  Remember - the changes to your sex life don't usually last long. There's usually no medical reason to stop having sex during chemo. The drugs won't have any long term physical effects on your performance or enjoyment of sex. Cancer can't be passed on to your partner during sex  Contraception It's important to use reliable contraception during treatment. Avoid getting pregnant while you or your partner are having chemotherapy. This is because the drugs may harm the baby. Sometimes chemotherapy drugs can leave a man or woman infertile.  This means you would not be able to have children in the future. You might want to talk to someone about permanent infertility. It can be very difficult to learn that you may no longer be able to have children. Some people find counselling helpful. There might be ways to preserve your fertility, although this is easier for men than for women. You may want to speak to a fertility expert. You can talk about sperm banking or harvesting your eggs. You can also ask about other fertility options, such as donor eggs. If you have or have had breast cancer, your doctor might advise you not to  take the contraceptive pill. This is because the hormones in it might affect the cancer. It is not known for sure whether or not chemotherapy drugs can be passed on through semen or secretions from the vagina. Because of this some doctors advise people to use a barrier method if you have sex during treatment. This applies to vaginal, anal or oral sex. Generally, doctors advise a barrier method only for the time you are actually  having the treatment and for about a week after your treatment. Advice like this can be worrying, but this does not mean that you have to avoid being intimate with your partner. You can still have close contact with your partner and continue to enjoy sex.  Animals If you have cats or birds we just ask that you not change the litter or change the cage.  Please have someone else do this for you while you are on chemotherapy.   Food Safety During and After Cancer Treatment Food safety is important for people both during and after cancer treatment. Cancer and cancer treatments, such as chemotherapy, radiation therapy, and stem cell/bone marrow transplantation, often weaken the immune system. This makes it harder for your body to protect itself from foodborne illness, also called food poisoning. Foodborne illness is caused by eating food that contains harmful bacteria, parasites, or viruses.  Foods to avoid Some foods have a higher risk of becoming tainted with bacteria. These include: Unwashed fresh fruit and vegetables, especially leafy vegetables that can hide dirt and other contaminants Raw sprouts, such as alfalfa sprouts Raw or undercooked beef, especially ground beef, or other raw or undercooked meat and poultry Fatty, fried, or spicy foods immediately before or after treatment.  These can sit heavy on your stomach and make you feel nauseous. Raw or undercooked shellfish, such as oysters. Sushi and sashimi, which often contain raw fish.  Unpasteurized beverages, such as  unpasteurized fruit juices, raw milk, raw yogurt, or cider Undercooked eggs, such as soft boiled, over easy, and poached; raw, unpasteurized eggs; or foods made with raw egg, such as homemade raw cookie dough and homemade mayonnaise  Simple steps for food safety  Shop smart. Do not buy food stored or displayed in an unclean area. Do not buy bruised or damaged fruits or vegetables. Do not buy cans that have cracks, dents, or bulges. Pick up foods that can spoil at the end of your shopping trip and store them in a cooler on the way home.  Prepare and clean up foods carefully. Rinse all fresh fruits and vegetables under running water, and dry them with a clean towel or paper towel. Clean the top of cans before opening them. After preparing food, wash your hands for 20 seconds with hot water and soap. Pay special attention to areas between fingers and under nails. Clean your utensils and dishes with hot water and soap. Disinfect your kitchen and cutting boards using 1 teaspoon of liquid, unscented bleach mixed into 1 quart of water.    Dispose of old food. Eat canned and packaged food before its expiration date (the "use by" or "best before" date). Consume refrigerated leftovers within 3 to 4 days. After that time, throw out the food. Even if the food does not smell or look spoiled, it still may be unsafe. Some bacteria, such as Listeria, can grow even on foods stored in the refrigerator if they are kept for too long.  Take precautions when eating out. At restaurants, avoid buffets and salad bars where food sits out for a long time and comes in contact with many people. Food can become contaminated when someone with a virus, often a norovirus, or another "bug" handles it. Put any leftover food in a "to-go" container yourself, rather than having the server do it. And, refrigerate leftovers as soon as you get home. Choose restaurants that are clean and that are willing to prepare your food as you  order it cooked.  SYMPTOMS TO REPORT AS SOON AS POSSIBLE AFTER TREATMENT:  FEVER GREATER THAN 100.4 F  CHILLS WITH OR WITHOUT FEVER  NAUSEA AND VOMITING THAT IS NOT CONTROLLED WITH YOUR NAUSEA MEDICATION  UNUSUAL SHORTNESS OF BREATH  UNUSUAL BRUISING OR BLEEDING  TENDERNESS IN MOUTH AND THROAT WITH OR WITHOUT PRESENCE OF ULCERS  URINARY PROBLEMS  BOWEL PROBLEMS  UNUSUAL RASH     What to do if you need assistance after hours or on the weekends: CALL [phone].  HOLD on the line, do not hang up.  You will hear multiple messages but at the end you will be connected with a nurse triage line.  They will contact the doctor if necessary.  Most of the time they will be able to assist you.  Do not call the hospital operator.      I have been informed and understand all of the instructions given to me and have received a copy. I have been instructed to call the clinic [phone] or my family physician as soon as possible for continued medical care, if indicated. I do not have any more questions at this time but understand that I may call the Cancer Center or the Patient Navigator at [phone] during office hours should I have questions or need assistance in obtaining follow-up care.

## 2023-10-30 ENCOUNTER — Other Ambulatory Visit: Payer: Self-pay | Admitting: *Deleted

## 2023-10-30 ENCOUNTER — Inpatient Hospital Stay: Admitting: Dietician

## 2023-10-30 ENCOUNTER — Inpatient Hospital Stay

## 2023-10-30 DIAGNOSIS — C3412 Malignant neoplasm of upper lobe, left bronchus or lung: Secondary | ICD-10-CM

## 2023-10-30 MED ORDER — PROCHLORPERAZINE MALEATE 10 MG PO TABS: 10.0000 mg | ORAL_TABLET | Freq: Four times a day (QID) | ORAL | 0 refills | Status: DC | PRN

## 2023-10-30 MED ORDER — LIDOCAINE-PRILOCAINE 2.5-2.5 % EX CREA: 1.0000 | TOPICAL_CREAM | CUTANEOUS | 1 refills | Status: DC | PRN

## 2023-10-30 MED ORDER — LACTULOSE 20 GM/30ML PO SOLN: 30.0000 mL | Freq: Every day | ORAL | 1 refills | Status: AC | PRN

## 2023-10-30 MED ORDER — LIDOCAINE-PRILOCAINE 2.5-2.5 % EX CREA: TOPICAL_CREAM | CUTANEOUS | 3 refills | Status: DC

## 2023-10-30 MED ORDER — PROCHLORPERAZINE MALEATE 10 MG PO TABS: 10.0000 mg | ORAL_TABLET | Freq: Four times a day (QID) | ORAL | 2 refills | Status: DC | PRN

## 2023-10-30 MED ORDER — LIDOCAINE-PRILOCAINE 2.5-2.5 % EX CREA: 1.0000 | TOPICAL_CREAM | CUTANEOUS | 0 refills | Status: AC | PRN

## 2023-10-30 NOTE — Progress Notes (Signed)

## 2023-10-30 NOTE — Progress Notes (Signed)
 Nutrition Assessment   Reason for Assessment: Referral (wt loss)   ASSESSMENT: 63 year old female with newly diagnosed stage II SCC of LUL. She is planning neoadjuvant chemoimmunotherapy with carboplatin/paclitaxel + nivolumab q21d. Patient is under the care of Dr. Ellin Saba.   Past medical history includes HTN, hypercholesteremia, anxiety, neurofibromatosis  Met with pt following chemo education. Pt is feeling fatigued and in pain today. She took a second pain pill ~one hour after taking the first one this morning. Pt endorsing 6/10 left sided pain at visit. Reports having no appetite due to altered taste of foods. This is new for pt in the last couple of weeks. Says foods either taste like salt or nothing at all. Recalls 25% of steak/gravy yesterday. This is all she had. Pt reports drinking Ensure when able to afford them. These do not taste right anymore either. Pt drinks only water, but not much. Unable to quantify. Pt is constipated. Her last BM was one week ago (3/10). She took 100 mg stool softener yesterday. Pt denies nausea, vomiting.   Nutrition Focused Physical Exam: deferred    Medications: amlodipine, gabapentin, hydroxyzine, remeron, zofran, oxycodone, percocet, zoloft   Labs: 1/27 labs reviewed    Anthropometrics:   Height: 5'1" Weight: 119 lb 0.8 oz UBW: 140 lb (08/02/23) BMI: 22.33    NUTRITION DIAGNOSIS: Unintended wt loss related to cancer as evidenced by 15% decrease from usual weight in 3 months - this is severe for time frame   INTERVENTION:  Educated on importance of adequate calorie/protein energy intake to maintain strength/weights during treatment Discussed strategies for taste changes and importance of oral care. Suggested trying baking soda salt water rinses before meals - handout with tips + recipe Educated on protein sources, recommend protein food at every meal Encouraged small frequent meals/snacks vs one larger meal, suggested soft moist textures  for ease of intake - handout with ideas provided  Continue drinking Ensure Complete, 1-2/day - samples + coupons  Message to provider regarding bowel regimen + poorly controlled pain   MONITORING, EVALUATION, GOAL: Pt will tolerate increased calories and protein to minimize further wt loss during treatment    Next Visit: Monday April 14 via telephone

## 2023-10-31 ENCOUNTER — Inpatient Hospital Stay

## 2023-10-31 ENCOUNTER — Other Ambulatory Visit: Payer: Self-pay | Admitting: *Deleted

## 2023-10-31 VITALS — BP 134/78 | HR 105 | Temp 98.0°F | Resp 18

## 2023-10-31 DIAGNOSIS — C3412 Malignant neoplasm of upper lobe, left bronchus or lung: Secondary | ICD-10-CM

## 2023-10-31 DIAGNOSIS — Z5111 Encounter for antineoplastic chemotherapy: Secondary | ICD-10-CM | POA: Diagnosis not present

## 2023-10-31 LAB — CBC WITH DIFFERENTIAL/PLATELET
Abs Immature Granulocytes: 0.09 10*3/uL — ABNORMAL HIGH (ref 0.00–0.07)
Basophils Absolute: 0.1 10*3/uL (ref 0.0–0.1)
Basophils Relative: 1 %
Eosinophils Absolute: 0 10*3/uL (ref 0.0–0.5)
Eosinophils Relative: 0 %
HCT: 33.7 % — ABNORMAL LOW (ref 36.0–46.0)
Hemoglobin: 10.8 g/dL — ABNORMAL LOW (ref 12.0–15.0)
Immature Granulocytes: 1 %
Lymphocytes Relative: 7 %
Lymphs Abs: 1.1 10*3/uL (ref 0.7–4.0)
MCH: 28.7 pg (ref 26.0–34.0)
MCHC: 32 g/dL (ref 30.0–36.0)
MCV: 89.6 fL (ref 80.0–100.0)
Monocytes Absolute: 2.5 10*3/uL — ABNORMAL HIGH (ref 0.1–1.0)
Monocytes Relative: 16 %
Neutro Abs: 11.3 10*3/uL — ABNORMAL HIGH (ref 1.7–7.7)
Neutrophils Relative %: 75 %
Platelets: 632 10*3/uL — ABNORMAL HIGH (ref 150–400)
RBC: 3.76 MIL/uL — ABNORMAL LOW (ref 3.87–5.11)
RDW: 15.2 % (ref 11.5–15.5)
WBC: 15 10*3/uL — ABNORMAL HIGH (ref 4.0–10.5)
nRBC: 0 % (ref 0.0–0.2)

## 2023-10-31 LAB — COMPREHENSIVE METABOLIC PANEL
ALT: 10 U/L (ref 0–44)
AST: 11 U/L — ABNORMAL LOW (ref 15–41)
Albumin: 2.7 g/dL — ABNORMAL LOW (ref 3.5–5.0)
Alkaline Phosphatase: 88 U/L (ref 38–126)
Anion gap: 16 — ABNORMAL HIGH (ref 5–15)
BUN: 9 mg/dL (ref 8–23)
CO2: 23 mmol/L (ref 22–32)
Calcium: 11.3 mg/dL — ABNORMAL HIGH (ref 8.9–10.3)
Chloride: 98 mmol/L (ref 98–111)
Creatinine, Ser: 0.47 mg/dL (ref 0.44–1.00)
GFR, Estimated: >60 mL/min (ref >60)
Glucose, Bld: 98 mg/dL (ref 70–99)
Potassium: 3.2 mmol/L — ABNORMAL LOW (ref 3.5–5.1)
Sodium: 137 mmol/L (ref 135–145)
Total Bilirubin: 0.9 mg/dL (ref 0.0–1.2)
Total Protein: 7.5 g/dL (ref 6.5–8.1)

## 2023-10-31 LAB — MAGNESIUM: Magnesium: 1.7 mg/dL (ref 1.7–2.4)

## 2023-10-31 LAB — TSH: TSH: 0.35 u[IU]/mL (ref 0.350–4.500)

## 2023-10-31 MED ORDER — ZOLEDRONIC ACID 4 MG/100ML IV SOLN
4.0000 mg | Freq: Once | INTRAVENOUS | Status: AC
  Administered 2023-10-31: 4 mg via INTRAVENOUS
  Filled 2023-10-31: qty 100

## 2023-10-31 MED ORDER — DEXAMETHASONE SODIUM PHOSPHATE 10 MG/ML IJ SOLN
10.0000 mg | Freq: Once | INTRAMUSCULAR | Status: AC
  Administered 2023-10-31: 10 mg via INTRAVENOUS
  Filled 2023-10-31: qty 1

## 2023-10-31 MED ORDER — DIPHENHYDRAMINE HCL 50 MG/ML IJ SOLN
50.0000 mg | Freq: Once | INTRAMUSCULAR | Status: AC
  Administered 2023-10-31: 50 mg via INTRAVENOUS
  Filled 2023-10-31: qty 1

## 2023-10-31 MED ORDER — SODIUM CHLORIDE 0.9 % IV SOLN
Freq: Once | INTRAVENOUS | Status: AC
Start: 2023-10-31 — End: 2023-10-31

## 2023-10-31 MED ORDER — SODIUM CHLORIDE 0.9 % IV SOLN
175.0000 mg/m2 | Freq: Once | INTRAVENOUS | Status: AC
  Administered 2023-10-31: 270 mg via INTRAVENOUS
  Filled 2023-10-31: qty 45

## 2023-10-31 MED ORDER — PALONOSETRON HCL INJECTION 0.25 MG/5ML
0.2500 mg | Freq: Once | INTRAVENOUS | Status: AC
  Administered 2023-10-31: 0.25 mg via INTRAVENOUS
  Filled 2023-10-31: qty 5

## 2023-10-31 MED ORDER — POTASSIUM CHLORIDE CRYS ER 20 MEQ PO TBCR
40.0000 meq | EXTENDED_RELEASE_TABLET | Freq: Once | ORAL | Status: AC
  Administered 2023-10-31: 40 meq via ORAL
  Filled 2023-10-31: qty 2

## 2023-10-31 MED ORDER — FOSAPREPITANT DIMEGLUMINE INJECTION 150 MG
150.0000 mg | Freq: Once | INTRAVENOUS | Status: AC
  Administered 2023-10-31: 150 mg via INTRAVENOUS
  Filled 2023-10-31: qty 150

## 2023-10-31 MED ORDER — SODIUM CHLORIDE 0.9 % IV SOLN
570.6000 mg | Freq: Once | INTRAVENOUS | Status: AC
  Administered 2023-10-31: 570 mg via INTRAVENOUS
  Filled 2023-10-31: qty 57

## 2023-10-31 MED ORDER — SODIUM CHLORIDE 0.9 % IV SOLN
360.0000 mg | Freq: Once | INTRAVENOUS | Status: AC
  Administered 2023-10-31: 360 mg via INTRAVENOUS
  Filled 2023-10-31: qty 24

## 2023-10-31 MED ORDER — DEXAMETHASONE 2 MG PO TABS: 2.0000 mg | ORAL_TABLET | Freq: Every day | ORAL | 0 refills | Status: DC

## 2023-10-31 MED ORDER — SODIUM CHLORIDE 0.9 % IV SOLN
INTRAVENOUS | Status: DC
Start: 2023-10-31 — End: 2023-10-31

## 2023-10-31 MED ORDER — HEPARIN SOD (PORK) LOCK FLUSH 100 UNIT/ML IV SOLN
500.0000 [IU] | Freq: Once | INTRAVENOUS | Status: AC | PRN
  Administered 2023-10-31: 500 [IU]

## 2023-10-31 MED ORDER — FAMOTIDINE IN NACL 20-0.9 MG/50ML-% IV SOLN
20.0000 mg | Freq: Once | INTRAVENOUS | Status: AC
  Administered 2023-10-31: 20 mg via INTRAVENOUS
  Filled 2023-10-31: qty 50

## 2023-10-31 NOTE — Progress Notes (Signed)
 Patient tolerated chemotherapy with no complaints voiced.  Side effects with management reviewed with understanding verbalized.  Port site clean and dry with no bruising or swelling noted at site.  Good blood return noted before and after administration of chemotherapy.  Band aid applied.  Medication list printed and reviewed over with patient, pt verbalized understanding of all her medications. Patient left in satisfactory condition with VSS and no s/s of distress noted. All follow ups as scheduled.   Erikson Danzy [employer]

## 2023-10-31 NOTE — Progress Notes (Signed)
 Pharmacist Chemotherapy Monitoring - Initial Assessment    Anticipated start date: 10/31/23   The following has been reviewed per standard work regarding the patient's treatment regimen: The patient's diagnosis, treatment plan and drug doses, and organ/hematologic function Lab orders and baseline tests specific to treatment regimen  The treatment plan start date, drug sequencing, and pre-medications Prior authorization status  Patient's documented medication list, including drug-drug interaction screen and prescriptions for anti-emetics and supportive care specific to the treatment regimen The drug concentrations, fluid compatibility, administration routes, and timing of the medications to be used The patient's access for treatment and lifetime cumulative dose history, if applicable  The patient's medication allergies and previous infusion related reactions, if applicable   Changes made to treatment plan:  N/A  Follow up needed:  N/A   Stephens Shire, [facility], 10/31/2023  8:41 AM

## 2023-10-31 NOTE — Patient Instructions (Signed)
 [facility] - A DEPT OF [facility]  Discharge Instructions: Thank you for choosing [facility] to provide your oncology and hematology care.  If you have a lab appointment with the Cancer Center - please note that after April 8th, 2024, all labs will be drawn in the cancer center.  You do not have to check in or register with the main entrance as you have in the past but will complete your check-in in the cancer center.  Wear comfortable clothing and clothing appropriate for easy access to any Portacath or PICC line.   We strive to give you quality time with your provider. You may need to reschedule your appointment if you arrive late (15 or more minutes).  Arriving late affects you and other patients whose appointments are after yours.  Also, if you miss three or more appointments without notifying the office, you may be dismissed from the clinic at the provider's discretion.      For prescription refill requests, have your pharmacy contact our office and allow 72 hours for refills to be completed.    Today you received the following chemotherapy and/or immunotherapy agents opdivo, taxol, carboplatin    To help prevent nausea and vomiting after your treatment, we encourage you to take your nausea medication as directed.  BELOW ARE SYMPTOMS THAT SHOULD BE REPORTED IMMEDIATELY: *FEVER GREATER THAN 100.4 F (38 C) OR HIGHER *CHILLS OR SWEATING *NAUSEA AND VOMITING THAT IS NOT CONTROLLED WITH YOUR NAUSEA MEDICATION *UNUSUAL SHORTNESS OF BREATH *UNUSUAL BRUISING OR BLEEDING *URINARY PROBLEMS (pain or burning when urinating, or frequent urination) *BOWEL PROBLEMS (unusual diarrhea, constipation, pain near the anus) TENDERNESS IN MOUTH AND THROAT WITH OR WITHOUT PRESENCE OF ULCERS (sore throat, sores in mouth, or a toothache) UNUSUAL RASH, SWELLING OR PAIN  UNUSUAL VAGINAL DISCHARGE OR ITCHING   Items with * indicate a potential emergency and should  be followed up as soon as possible or go to the Emergency Department if any problems should occur.  Please show the CHEMOTHERAPY ALERT CARD or IMMUNOTHERAPY ALERT CARD at check-in to the Emergency Department and triage nurse.  Should you have questions after your visit or need to cancel or reschedule your appointment, please contact [facility] [place] - A DEPT OF [facility] [phone]  and follow the prompts.  Office hours are 8:00 a.m. to 4:30 p.m. Monday - Friday. Please note that voicemails left after 4:00 p.m. may not be returned until the following business day.  We are closed weekends and major holidays. You have access to a nurse at all times for urgent questions. Please call the main number to the clinic [phone] and follow the prompts.  For any non-urgent questions, you may also contact your provider using MyChart. We now offer e-Visits for anyone 39 and older to request care online for non-urgent symptoms. For details visit [email or web address].   Also download the MyChart app! Go to the app store, search "MyChart", open the app, select [facility], and log in with your MyChart username and password.

## 2023-10-31 NOTE — Progress Notes (Signed)
 Give NS 1000 ml over 2 hours and zometa 4 mg IVPB for hypercalcemia - Corrected Calcium = 12.34.  T.O. Dr Carilyn Goodpasture, PharmD

## 2023-11-01 ENCOUNTER — Inpatient Hospital Stay: Admitting: Licensed Clinical Social Worker

## 2023-11-01 DIAGNOSIS — C3412 Malignant neoplasm of upper lobe, left bronchus or lung: Secondary | ICD-10-CM

## 2023-11-01 NOTE — Progress Notes (Signed)
 24 hour call back: Patient denies any side effects related to treatment on 10/31/2023. Opdivo/Carboplatin. Patient states she feels fine and is drinking plenty of fluids and her appetite is normal. Patient instructed to call the clinic with any concerns if they arise. Understanding verbalized.

## 2023-11-01 NOTE — Progress Notes (Signed)
 [facility] Clinical Social Work  Initial Assessment   Denise Rivas is a 63 y.o. year old female contacted by phone. Clinical Social Work was referred by medical provider for assessment of psychosocial needs.   SDOH (Social Determinants of Health) assessments performed: Yes   SDOH Screenings   Social Connections: Unknown (06/14/2022)   Received from [facility], [facility]  Tobacco Use: High Risk (10/12/2023)     Distress Screen completed: No     No data to display            Family/Social Information:  Housing Arrangement: patient lives alone Family members/support persons in your life? Pt states she has a number of friends and family who call or stop by to check on her daily.  Pt feels at this time she is very well supported and will have assistance if needed.   Transportation concerns: no  Employment: Retired .  Income source: Actor concerns: No Type of concern: None Food access concerns: no Religious or spiritual practice: Yes-  Advanced directives: Not known Services Currently in place:  none  Coping/ Adjustment to diagnosis: Patient understands treatment plan and what happens next? yes Concerns about diagnosis and/or treatment: Overwhelmed by information and Afraid of cancer Patient reported stressors: Anxiety/ nervousness and Adjusting to my illness Hopes and/or priorities: Pt's priority is to continue w/ treatment w/ the hope of positive results Patient enjoys time with family/ friends Current coping skills/ strengths: Capable of independent living , Motivation for treatment/growth , and Supportive family/friends     SUMMARY: Current SDOH Barriers:  No barriers identified at this time.  Clinical Social Work Clinical Goal(s):  No clinical social work goals at this time  Interventions: Discussed common feeling and emotions when being diagnosed with cancer, and the importance of support during treatment Informed patient  of the support team roles and support services at [facility] Provided CSW contact information and encouraged patient to call with any questions or concerns Referred patient to [employer] [employer] [facility].  Pt states she has not gone through anything like cancer treatment and feels anxiety regarding the unknown, but is of strong faith and is relying on prayer as she moves forward.  Pt reports no financial concerns at this time, but given information for the [employer] should concerns arise while undergoing treatment.    Follow Up Plan: Patient will contact CSW with any support or resource needs Patient verbalizes understanding of plan: Yes    Rachel Moulds, LCSW Clinical Social Worker [facility]

## 2023-11-02 ENCOUNTER — Inpatient Hospital Stay

## 2023-11-02 ENCOUNTER — Inpatient Hospital Stay: Admitting: Dietician

## 2023-11-02 VITALS — BP 119/91 | HR 100 | Temp 97.0°F | Resp 18

## 2023-11-02 DIAGNOSIS — C3412 Malignant neoplasm of upper lobe, left bronchus or lung: Secondary | ICD-10-CM

## 2023-11-02 DIAGNOSIS — Z5111 Encounter for antineoplastic chemotherapy: Secondary | ICD-10-CM | POA: Diagnosis not present

## 2023-11-02 LAB — T4: T4, Total: 8.3 ug/dL (ref 4.5–12.0)

## 2023-11-02 MED ORDER — PEGFILGRASTIM-CBQV 6 MG/0.6ML ~~LOC~~ SOSY
6.0000 mg | PREFILLED_SYRINGE | Freq: Once | SUBCUTANEOUS | Status: AC
Start: 2023-11-02 — End: 2023-11-02
  Administered 2023-11-02: 6 mg via SUBCUTANEOUS
  Filled 2023-11-02: qty 0.6

## 2023-11-02 NOTE — Patient Instructions (Signed)
 [facility] - A DEPT OF [facility]  Discharge Instructions: Thank you for choosing [facility] to provide your oncology and hematology care.  If you have a lab appointment with the Cancer Center - please note that after April 8th, 2024, all labs will be drawn in the cancer center.  You do not have to check in or register with the main entrance as you have in the past but will complete your check-in in the cancer center.  Wear comfortable clothing and clothing appropriate for easy access to any Portacath or PICC line.   We strive to give you quality time with your provider. You may need to reschedule your appointment if you arrive late (15 or more minutes).  Arriving late affects you and other patients whose appointments are after yours.  Also, if you miss three or more appointments without notifying the office, you may be dismissed from the clinic at the provider's discretion.      For prescription refill requests, have your pharmacy contact our office and allow 72 hours for refills to be completed.    Today you received the following undeyca injection, return as scheduled.     To help prevent nausea and vomiting after your treatment, we encourage you to take your nausea medication as directed.  BELOW ARE SYMPTOMS THAT SHOULD BE REPORTED IMMEDIATELY: *FEVER GREATER THAN 100.4 F (38 C) OR HIGHER *CHILLS OR SWEATING *NAUSEA AND VOMITING THAT IS NOT CONTROLLED WITH YOUR NAUSEA MEDICATION *UNUSUAL SHORTNESS OF BREATH *UNUSUAL BRUISING OR BLEEDING *URINARY PROBLEMS (pain or burning when urinating, or frequent urination) *BOWEL PROBLEMS (unusual diarrhea, constipation, pain near the anus) TENDERNESS IN MOUTH AND THROAT WITH OR WITHOUT PRESENCE OF ULCERS (sore throat, sores in mouth, or a toothache) UNUSUAL RASH, SWELLING OR PAIN  UNUSUAL VAGINAL DISCHARGE OR ITCHING   Items with * indicate a potential emergency and should be followed up as soon as  possible or go to the Emergency Department if any problems should occur.  Please show the CHEMOTHERAPY ALERT CARD or IMMUNOTHERAPY ALERT CARD at check-in to the Emergency Department and triage nurse.  Should you have questions after your visit or need to cancel or reschedule your appointment, please contact [facility] - A DEPT OF [facility] [phone]  and follow the prompts.  Office hours are 8:00 a.m. to 4:30 p.m. Monday - Friday. Please note that voicemails left after 4:00 p.m. may not be returned until the following business day.  We are closed weekends and major holidays. You have access to a nurse at all times for urgent questions. Please call the main number to the clinic [phone] and follow the prompts.  For any non-urgent questions, you may also contact your provider using MyChart. We now offer e-Visits for anyone 7 and older to request care online for non-urgent symptoms. For details visit [email or web address].   Also download the MyChart app! Go to the app store, search "MyChart", open the app, select [place], and log in with your MyChart username and password.

## 2023-11-02 NOTE — Progress Notes (Signed)
 Patient tolerated injection with no complaints voiced. Site clean and dry with no bruising or swelling noted at site. See MAR for details. Band aid applied.  Patient stable during and after injection. VSS with discharge and left in satisfactory condition with no s/s of distress noted.

## 2023-11-06 ENCOUNTER — Other Ambulatory Visit: Payer: Self-pay | Admitting: *Deleted

## 2023-11-06 NOTE — Progress Notes (Signed)
 [facility] [address], [place] [address]    Clinic Day:  11/07/2023  Referring physician: Wilmon Pali, FNP  Patient Care Team: Wilmon Pali, FNP as PCP - General (Family Medicine) Nyoka Cowden, MD as Consulting Physician (Pulmonary Disease)   ASSESSMENT & PLAN:   Assessment: 1.  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  Mass abuts anterior pleural surface.  There is mild cortical erosion along the inner aspect of the left anterior third rib abutting the left upper lobe mass consistent with direct invasion.  Indeterminate 5 mm subsolid LUL nodule too small to characterize. - Denies any fevers, night sweats or weight loss.  No hemoptysis. - She reports burning all over the body.  Also reports left chest wall pain, aching type preventing her to sleep on the left side.  She is taking Tylenol as needed which helps. - Guardant360: PIK3CA [id number], PTEN deletion (exon 1), T p53, BLM - PET scan (09/01/2023): 7 cm anterior left upper lobe mass invading left chest wall with destruction of the left third anterior rib.  No adenopathy or distant metastatic disease.  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 - MRI brain (08/31/2022): No evidence of metastatic disease. - Left upper lobe biopsy (09/11/2023): Invasive moderately differentiated squamous cell carcinoma. - Cycle 1 of neoadjuvant carboplatin, paclitaxel and nivolumab on 10/31/2023   2. Social/Family History: -Lives at home alone and does not work. Tobacco use of 1 cigarette a day since her 20's. No chemical exposures.  -Mother had uterine cancer. 2 sisters had breast cancer.     Plan: 1.  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 - She had hypercalcemia with calcium at 11.3 and albumin 2.7.  Will give Zometa on same day.  Calcium today improved to 9.0 with albumin 2.8. - She denies any GI side effects.  Labs today: LFTs are normal.  White count elevated from G-CSF.  RTC 2 weeks for follow-up of prior to cycle 2.   2.  Left chest wall pain: - She reports pain improved since chemotherapy.  Continue Percocet 10/325 mg every 6-8 hours as needed.   3.  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  4.  Decreased appetite: - Will start on Megace twice daily.  5.  Hypokalemia: - Potassium was 3.2 consistently.  Will start her on K-Dur 20 milligrams daily.    No orders of the defined types were placed in this encounter.     [name],acting as a Neurosurgeon for Doreatha Massed, MD.,have documented all relevant documentation on the behalf of Doreatha Massed, MD,as directed by  Doreatha Massed, MD while in the presence of Doreatha Massed, MD.  I, Doreatha Massed MD, have reviewed the above documentation for accuracy and completeness, and I agree with the above.    Doreatha Massed, MD   3/25/20251:13 PM  CHIEF COMPLAINT:   Diagnosis: LUL squamous cell lung cancer   Cancer Staging  Primary squamous cell carcinoma of upper lobe of left lung (HCC) Staging form: Lung, AJCC V9 - Clinical stage from 09/20/2023: Stage  IIB (cT3, cN0, cM0) - Unsigned    Prior Therapy: none  Current Therapy: Neoadjuvant chemoimmunotherapy   HISTORY OF PRESENT ILLNESS:   Oncology History  Primary squamous cell carcinoma of upper lobe of left lung (HCC)  09/20/2023 Initial Diagnosis   Primary squamous cell carcinoma of upper lobe of left lung (HCC)   10/31/2023 -  Chemotherapy   Patient is on Treatment Plan : LUNG Carboplatin + Paclitaxel q21d        INTERVAL HISTORY:   Denise Rivas is a 63 y.o. female  presenting to clinic today for follow up of LUL squamous cell lung cancer. She was last seen by me on 10/25/23.  Today, she states that she is doing well overall. Her appetite level is at 0%. Her energy level is at 75%. Sacheen is unaware if she is taking percocet or oxycodone for pain. She is out of oxycodone, which has been effective in improving left chest wall pain. Jenness still has occasional sharp pain in left axillary and anterior chest wall. She reports decreased appetite. She denies any nausea, vomiting, constipation, or tiredness due to treatment.   PAST MEDICAL HISTORY:   Past Medical History: Past Medical History:  Diagnosis Date   Anxiety    Hypercholesteremia    Hypertension    Lung cancer (HCC)    Neurofibromatosis, type 1 [facility])    saw Dermatology [facility] 2023    Surgical History: Past Surgical History:  Procedure Laterality Date   ABDOMINAL HYSTERECTOMY     PORTACATH PLACEMENT Right 10/12/2023   Procedure: INSERTION PORT-A-CATH;  Surgeon: Lucretia Roers, MD;  Location: [facility];  Service: General;  Laterality: Right;    Social History: Social History   Socioeconomic History   Marital status: Single    Spouse name: Not on file   Number of children: Not on file   Years of education: Not on file   Highest education level: Not on file  Occupational History   Not on file  Tobacco Use   Smoking status: Some Days    Types: Cigarettes   Smokeless tobacco: Never  Vaping Use   Vaping status: Never Used  Substance and Sexual Activity   Alcohol use: Not Currently   Drug use: Never   Sexual activity: Not on file  Other Topics Concern   Not on file  Social History Narrative   Not on file   Social Drivers of Health   Financial Resource Strain: Not on file  Food Insecurity: Not on file  Transportation Needs: Not on file  Physical Activity: Not on file  Stress: Not on file  Social Connections: Unknown (06/14/2022)   Received from [facility], [facility]    Social Network    Social Network: Not on file  Intimate Partner Violence: Unknown (06/14/2022)   Received from [employer], [facility]   HITS    Physically Hurt: Not on file    Insult or Talk Down To: Not on file    Threaten Physical Harm: Not on file    Scream or Curse: Not on file    Family History: No family history on file.  Current Medications:  Current Outpatient Medications:    amLODipine (NORVASC) 5 MG tablet, Take 5 mg by mouth in the morning., Disp: , Rfl:    aspirin EC 81 MG tablet, Take 81 mg by mouth in the morning., Disp: , Rfl:    dexamethasone (DECADRON) 2 MG tablet, Take 1 tablet (2 mg total) by mouth daily with breakfast.,  Disp: 7 tablet, Rfl: 0   fluticasone (FLONASE) 50 MCG/ACT nasal spray, Place 1 spray into both nostrils daily as needed for allergies., Disp: , Rfl:    Fosaprepitant Dimeglumine (EMEND IV), Inject into the vein., Disp: , Rfl:    gabapentin (NEURONTIN) 300 MG capsule, Take 1 capsule (300 mg total) by mouth 3 (three) times daily. (Patient taking differently: Take 300 mg by mouth See admin instructions. Take 1 capsule (300 mg) by mouth scheduled at bedtime, may take up to 2 additional dose if needed for pain.), Disp: 90 capsule, Rfl: 2   hydrOXYzine (ATARAX) 25 MG tablet, Take 25 mg by mouth daily as needed for anxiety., Disp: , Rfl:    ibuprofen (ADVIL) 200 MG tablet, Take 400 mg by mouth every 8 (eight) hours as needed (pain)., Disp: , Rfl:    Lactulose 20 GM/30ML SOLN, Take 30 mLs (20 g total) by mouth daily as needed. Take 30 ml by mouth every 3 hours until you have a bowel movement, then use daily as needed for constipation, Disp: 450 mL, Rfl: 1   lidocaine-prilocaine (EMLA) cream, Apply 1 Application topically as needed., Disp: 30 g, Rfl: 0   megestrol (MEGACE) 400 MG/10ML suspension, Take 10 mLs (400 mg total) by mouth 2 (two) times daily., Disp: 480 mL, Rfl: 2   mirtazapine (REMERON SOL-TAB) 45 MG disintegrating tablet, Take 45 mg by mouth  at bedtime., Disp: , Rfl:    naproxen sodium (ALEVE) 220 MG tablet, Take 220-440 mg by mouth 2 (two) times daily as needed (pain.)., Disp: , Rfl:    ondansetron (ZOFRAN) 4 MG tablet, Take 1 tablet (4 mg total) by mouth every 8 (eight) hours as needed., Disp: 30 tablet, Rfl: 1   oxyCODONE (ROXICODONE) 5 MG immediate release tablet, Take 1 tablet (5 mg total) by mouth every 4 (four) hours as needed for severe pain (pain score 7-10) or breakthrough pain., Disp: 4 tablet, Rfl: 0   oxyCODONE-acetaminophen (PERCOCET) 10-325 MG tablet, Take 1 tablet by mouth every 4 (four) hours as needed for pain., Disp: 60 tablet, Rfl: 0   Palonosetron HCl (ALOXI IV), Inject into the vein., Disp: , Rfl:    potassium chloride SA (KLOR-CON M) 20 MEQ tablet, Take 1 tablet (20 mEq total) by mouth daily., Disp: 30 tablet, Rfl: 5   pravastatin (PRAVACHOL) 40 MG tablet, Take 40 mg by mouth daily., Disp: , Rfl:    prochlorperazine (COMPAZINE) 10 MG tablet, Take 1 tablet (10 mg total) by mouth every 6 (six) hours as needed for nausea or vomiting., Disp: 60 tablet, Rfl: 2   prochlorperazine (COMPAZINE) 10 MG tablet, Take 1 tablet (10 mg total) by mouth every 6 (six) hours as needed for nausea or vomiting., Disp: 30 tablet, Rfl: 0   sertraline (ZOLOFT) 100 MG tablet, Take 100 mg by mouth in the morning., Disp: , Rfl:    Allergies: No Known Allergies  REVIEW OF SYSTEMS:   Review of Systems  Constitutional:  Negative for chills, fatigue and fever.  HENT:   Negative for lump/mass, mouth sores, nosebleeds, sore throat and trouble swallowing.   Eyes:  Negative for eye problems.  Respiratory:  Negative for cough and shortness of breath.   Cardiovascular:  Negative for chest pain, leg swelling and palpitations.  Gastrointestinal:  Negative for abdominal pain, constipation, diarrhea, nausea and vomiting.  Genitourinary:  Negative for bladder incontinence, difficulty urinating, dysuria, frequency, hematuria and nocturia.    Musculoskeletal:  Negative for arthralgias, back pain, flank pain, myalgias  and neck pain.  Skin:  Negative for itching and rash.  Neurological:  Negative for dizziness, headaches and numbness.  Hematological:  Does not bruise/bleed easily.  Psychiatric/Behavioral:  Positive for sleep disturbance. Negative for depression and suicidal ideas. The patient is not nervous/anxious.   All other systems reviewed and are negative.    VITALS:   Blood pressure (!) 121/93, pulse (!) 112, temperature 98.5 F (36.9 C), temperature source Oral, resp. rate 16, weight 113 lb 8.6 oz (51.5 kg), SpO2 98%.  Wt Readings from Last 3 Encounters:  11/07/23 113 lb 8.6 oz (51.5 kg)  10/25/23 119 lb 0.8 oz (54 kg)  10/12/23 124 lb 3.2 oz (56.3 kg)    Body mass index is 21.3 kg/m.  Performance status (ECOG): 1 - Symptomatic but completely ambulatory  PHYSICAL EXAM:   Physical Exam Vitals and nursing note reviewed. Exam conducted with a chaperone present.  Constitutional:      Appearance: Normal appearance.  Cardiovascular:     Rate and Rhythm: Normal rate and regular rhythm.     Pulses: Normal pulses.     Heart sounds: Normal heart sounds.  Pulmonary:     Effort: Pulmonary effort is normal.     Breath sounds: Normal breath sounds.  Abdominal:     Palpations: Abdomen is soft. There is no hepatomegaly, splenomegaly or mass.     Tenderness: There is no abdominal tenderness.  Musculoskeletal:     Right lower leg: No edema.     Left lower leg: No edema.  Lymphadenopathy:     Cervical: No cervical adenopathy.     Right cervical: No superficial, deep or posterior cervical adenopathy.    Left cervical: No superficial, deep or posterior cervical adenopathy.     Upper Body:     Right upper body: No supraclavicular or axillary adenopathy.     Left upper body: No supraclavicular or axillary adenopathy.  Neurological:     General: No focal deficit present.     Mental Status: She is alert and oriented to  person, place, and time.  Psychiatric:        Mood and Affect: Mood normal.        Behavior: Behavior normal.     LABS:   CBC     Component Value Date/Time   WBC 23.3 (H) 11/07/2023 0941   RBC 3.76 (L) 11/07/2023 0941   HGB 10.8 (L) 11/07/2023 0941   HCT 33.1 (L) 11/07/2023 0941   PLT 464 (H) 11/07/2023 0941   MCV 88.0 11/07/2023 0941   MCH 28.7 11/07/2023 0941   MCHC 32.6 11/07/2023 0941   RDW 15.4 11/07/2023 0941   LYMPHSABS 1.2 11/07/2023 0941   MONOABS 2.6 (H) 11/07/2023 0941   EOSABS 0.0 11/07/2023 0941   BASOSABS 0.0 11/07/2023 0941    CMP      Component Value Date/Time   NA 135 11/07/2023 0941   K 3.2 (L) 11/07/2023 0941   CL 100 11/07/2023 0941   CO2 23 11/07/2023 0941   GLUCOSE 108 (H) 11/07/2023 0941   BUN 8 11/07/2023 0941   CREATININE 0.46 11/07/2023 0941   CALCIUM 9.0 11/07/2023 0941   PROT 7.2 11/07/2023 0941   ALBUMIN 2.8 (L) 11/07/2023 0941   AST 16 11/07/2023 0941   ALT 16 11/07/2023 0941   ALKPHOS 124 11/07/2023 0941   BILITOT 0.3 11/07/2023 0941   GFRNONAA >60 11/07/2023 0941   GFRAA >60 02/18/2020 0932     No results found for: "CEA1", "CEA" /  No results found for: "CEA1", "CEA" No results found for: "PSA1" No results found for: "ZOX096" No results found for: "CAN125"  No results found for: "TOTALPROTELP", "ALBUMINELP", "A1GS", "A2GS", "BETS", "BETA2SER", "GAMS", "MSPIKE", "SPEI" No results found for: "TIBC", "FERRITIN", "IRONPCTSAT" No results found for: "LDH"   STUDIES:   DG Chest Port 1 View Result Date: 10/12/2023 CLINICAL DATA:  Port-A-Cath placement EXAM: PORTABLE CHEST 1 VIEW COMPARISON:  X-ray 08/13/2023.  PET-CT scan 09/01/2023 FINDINGS: Right subclavian chest port in place with tip at the SVC right atrial junction. No pneumothorax. No edema, effusion or consolidation. Normal cardiopericardial silhouette. Known large left upper lobe lung mass. IMPRESSION: Right subclavian chest port in place.  No pneumothorax. Electronically  Signed   By: Karen Kays M.D.   On: 10/12/2023 12:15   DG C-Arm 1-60 Min-No Report Result Date: 10/12/2023 Fluoroscopy was utilized by the requesting physician.  No radiographic interpretation.

## 2023-11-07 ENCOUNTER — Inpatient Hospital Stay (HOSPITAL_BASED_OUTPATIENT_CLINIC_OR_DEPARTMENT_OTHER): Admitting: Hematology

## 2023-11-07 ENCOUNTER — Inpatient Hospital Stay

## 2023-11-07 VITALS — BP 121/93 | HR 112 | Temp 98.5°F | Resp 16 | Wt 113.5 lb

## 2023-11-07 DIAGNOSIS — C3412 Malignant neoplasm of upper lobe, left bronchus or lung: Secondary | ICD-10-CM

## 2023-11-07 DIAGNOSIS — R918 Other nonspecific abnormal finding of lung field: Secondary | ICD-10-CM

## 2023-11-07 DIAGNOSIS — Z5111 Encounter for antineoplastic chemotherapy: Secondary | ICD-10-CM | POA: Diagnosis not present

## 2023-11-07 DIAGNOSIS — K118 Other diseases of salivary glands: Secondary | ICD-10-CM

## 2023-11-07 LAB — CBC WITH DIFFERENTIAL/PLATELET
Abs Immature Granulocytes: 0 10*3/uL (ref 0.00–0.07)
Band Neutrophils: 4 %
Basophils Absolute: 0 10*3/uL (ref 0.0–0.1)
Basophils Relative: 0 %
Eosinophils Absolute: 0 10*3/uL (ref 0.0–0.5)
Eosinophils Relative: 0 %
HCT: 33.1 % — ABNORMAL LOW (ref 36.0–46.0)
Hemoglobin: 10.8 g/dL — ABNORMAL LOW (ref 12.0–15.0)
Lymphocytes Relative: 5 %
Lymphs Abs: 1.2 10*3/uL (ref 0.7–4.0)
MCH: 28.7 pg (ref 26.0–34.0)
MCHC: 32.6 g/dL (ref 30.0–36.0)
MCV: 88 fL (ref 80.0–100.0)
Monocytes Absolute: 2.6 10*3/uL — ABNORMAL HIGH (ref 0.1–1.0)
Monocytes Relative: 11 %
Neutro Abs: 19.6 10*3/uL — ABNORMAL HIGH (ref 1.7–7.7)
Neutrophils Relative %: 80 %
Platelets: 464 10*3/uL — ABNORMAL HIGH (ref 150–400)
RBC: 3.76 MIL/uL — ABNORMAL LOW (ref 3.87–5.11)
RDW: 15.4 % (ref 11.5–15.5)
WBC: 23.3 10*3/uL — ABNORMAL HIGH (ref 4.0–10.5)
nRBC: 0 % (ref 0.0–0.2)

## 2023-11-07 LAB — COMPREHENSIVE METABOLIC PANEL
ALT: 16 U/L (ref 0–44)
AST: 16 U/L (ref 15–41)
Albumin: 2.8 g/dL — ABNORMAL LOW (ref 3.5–5.0)
Alkaline Phosphatase: 124 U/L (ref 38–126)
Anion gap: 12 (ref 5–15)
BUN: 8 mg/dL (ref 8–23)
CO2: 23 mmol/L (ref 22–32)
Calcium: 9 mg/dL (ref 8.9–10.3)
Chloride: 100 mmol/L (ref 98–111)
Creatinine, Ser: 0.46 mg/dL (ref 0.44–1.00)
GFR, Estimated: >60 mL/min (ref >60)
Glucose, Bld: 108 mg/dL — ABNORMAL HIGH (ref 70–99)
Potassium: 3.2 mmol/L — ABNORMAL LOW (ref 3.5–5.1)
Sodium: 135 mmol/L (ref 135–145)
Total Bilirubin: 0.3 mg/dL (ref 0.0–1.2)
Total Protein: 7.2 g/dL (ref 6.5–8.1)

## 2023-11-07 LAB — MAGNESIUM: Magnesium: 1.9 mg/dL (ref 1.7–2.4)

## 2023-11-07 MED ORDER — POTASSIUM CHLORIDE CRYS ER 20 MEQ PO TBCR
20.0000 meq | EXTENDED_RELEASE_TABLET | Freq: Every day | ORAL | 5 refills | Status: AC
Start: 2023-11-07 — End: ?

## 2023-11-07 MED ORDER — HEPARIN SOD (PORK) LOCK FLUSH 100 UNIT/ML IV SOLN
500.0000 [IU] | Freq: Once | INTRAVENOUS | Status: AC
  Administered 2023-11-07: 500 [IU] via INTRAVENOUS

## 2023-11-07 MED ORDER — SODIUM CHLORIDE 0.9% FLUSH
10.0000 mL | Freq: Once | INTRAVENOUS | Status: AC
  Administered 2023-11-07: 10 mL via INTRAVENOUS

## 2023-11-07 MED ORDER — MEGESTROL ACETATE 400 MG/10ML PO SUSP: 400.0000 mg | Freq: Two times a day (BID) | ORAL | 2 refills | Status: DC

## 2023-11-07 NOTE — Addendum Note (Signed)
 Addended by: Toniann Fail B on: 11/07/2023 10:57 AM   Modules accepted: Orders

## 2023-11-07 NOTE — Patient Instructions (Addendum)
 [facility] at [facility] Discharge Instructions   You were seen and examined today by Dr. Ellin Saba.  He reviewed the results of your lab work which are mostly normal/stable. Dr. Kirtland Bouchard was like for you to start taking potassium one pill a day. This prescription was sent to [facility]. We also sent a prescription for an appetite stimulant called Megace. You take 2 teaspoons twice a day.   We will see you back in 2 weeks with your next treatment.   Return as scheduled.    Thank you for choosing [facility] at [facility] to provide your oncology and hematology care.  To afford each patient quality time with our provider, please arrive at least 15 minutes before your scheduled appointment time.   If you have a lab appointment with the Cancer Center please come in thru the Main Entrance and check in at the main information desk.  You need to re-schedule your appointment should you arrive 10 or more minutes late.  We strive to give you quality time with our providers, and arriving late affects you and other patients whose appointments are after yours.  Also, if you no show three or more times for appointments you may be dismissed from the clinic at the providers discretion.     Again, thank you for choosing [facility].  Our hope is that these requests will decrease the amount of time that you wait before being seen by our physicians.       _____________________________________________________________  Should you have questions after your visit to [facility], please contact our office at [phone] and follow the prompts.  Our office hours are 8:00 a.m. and 4:30 p.m. Monday - Friday.  Please note that voicemails left after 4:00 p.m. may not be returned until the following business day.  We are closed weekends and major holidays.  You do have access to a nurse 24-7, just call the main number to the clinic [phone]  and do not press any options, hold on the line and a nurse will answer the phone.    For prescription refill requests, have your pharmacy contact our office and allow 72 hours.    Due to Covid, you will need to wear a mask upon entering the hospital. If you do not have a mask, a mask will be given to you at the Main Entrance upon arrival. For doctor visits, patients may have 1 support person age 80 or older with them. For treatment visits, patients can not have anyone with them due to social distancing guidelines and our immunocompromised population.

## 2023-11-15 ENCOUNTER — Other Ambulatory Visit: Payer: Self-pay | Admitting: Internal Medicine

## 2023-11-15 NOTE — Telephone Encounter (Signed)
 Dr Sherene Sires- how do you want to handle this refill request (90 days supply)- she was seen Jan 2025 and was told to f/u PRN.

## 2023-11-20 NOTE — Progress Notes (Signed)
 [facility] [address] [place] [address]    Clinic Day:  11/21/2023  Referring physician: Wilmon Pali, FNP  Patient Care Team: Wilmon Pali, FNP as PCP - General (Family Medicine) Nyoka Cowden, MD as Consulting Physician (Pulmonary Disease)   ASSESSMENT & PLAN:   Assessment: 1.  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  Mass abuts anterior pleural surface.  There is mild cortical erosion along the inner aspect of the left anterior third rib abutting the left upper lobe mass consistent with direct invasion.  Indeterminate 5 mm subsolid LUL nodule too small to characterize. - Denies any fevers, night sweats or weight loss.  No hemoptysis. - Denise Rivas reports burning all over the body.  Also reports left chest wall pain, aching type preventing her to sleep on the left side.  Denise Rivas is taking Tylenol as needed which helps. - Guardant360: PIK3CA [id number], PTEN deletion (exon 1), T p53, BLM - PET scan (09/01/2023): 7 cm anterior left upper lobe mass invading left chest wall with destruction of the left third anterior rib.  No adenopathy or distant metastatic disease.  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 - MRI brain (08/31/2022): No evidence of metastatic disease. - Left upper lobe biopsy (09/11/2023): Invasive moderately differentiated squamous cell carcinoma. - Cycle 1 of neoadjuvant carboplatin, paclitaxel and nivolumab on 10/31/2023   2. Social/Family History: -Lives at home alone and does not work. Tobacco use of 1 cigarette a day since her 20's. No chemical exposures.  -Mother had uterine cancer. 2 sisters had breast cancer.     Plan: 1.  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 - Reviewed labs today: Normal LFTs.  Almond 2.7 with calcium 9.0.  CBC grossly normal.  TSH is 0.35. - Proceed with cycle 2 of chemoimmunotherapy today.  RTC 3 weeks for follow-up.   2.  Left chest wall pain: - Pain has improved since the start of cycle 1.  Continue Percocet 10/325 twice daily as needed.  Previously Denise Rivas required 4 pills a day.   3.  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  4.  Decreased appetite: - Denise Rivas started taking Megace twice daily which is not helping.  Denise Rivas is drinking 1 can of Ensure per day.  No significant change in her weight.  Will closely monitor.  5.  Hypokalemia: - Continue K-Dur 20 mill equivalents daily.  Potassium today is normal at 4.0.    No orders of the defined types were placed in this encounter.     [name],acting as a Neurosurgeon for Doreatha Massed, MD.,have documented all relevant documentation on the behalf of Doreatha Massed, MD,as directed by  Doreatha Massed, MD while in the presence of Doreatha Massed, MD.  I, Doreatha Massed MD, have reviewed the above documentation for accuracy and completeness, and I agree with the above.     Doreatha Massed, MD   4/8/202510:16 AM  CHIEF COMPLAINT:   Diagnosis: LUL squamous cell lung cancer   Cancer Staging  Primary squamous cell carcinoma of upper lobe of left lung (HCC) Staging form: Lung,  AJCC V9 - Clinical stage from 09/20/2023: Stage IIB (cT3, cN0, cM0) - Unsigned    Prior Therapy: none  Current Therapy: Neoadjuvant chemoimmunotherapy   HISTORY OF PRESENT ILLNESS:   Oncology History  Primary squamous cell carcinoma of upper lobe of left lung (HCC)  09/20/2023 Initial Diagnosis   Primary squamous cell carcinoma of upper lobe of left lung (HCC)   10/31/2023 -  Chemotherapy   Patient is on Treatment Plan : LUNG Opdivo + Carboplatin + Paclitaxel q21d         INTERVAL HISTORY:   Denise Rivas is a 63 y.o. female presenting to clinic today for follow up of LUL squamous cell lung cancer. Denise Rivas was last seen by me on 11/07/23.  Today, Denise Rivas states that Denise Rivas is doing well overall. Her appetite level is at 25%. Her energy level is at 100%.   Her right axillary pain has improved to  occasional and sharp. Denise Rivas also notes soreness to the area. Denise Rivas is taking Percocet BID for pain.   [name] reports decreased appetite and states megace is not effective in improving appetite. Denise Rivas is taking Megace BID. Denise Rivas is drinking 1 ensure a day.   Denise Rivas reports difficulty swallowing potassium supplements and has to crush them in order to take them or lets them dissolve in water.   [name] denies any vomiting, nausea, diarrhea, or skin rashes due to her last infusion.   PAST MEDICAL HISTORY:   Past Medical History: Past Medical History:  Diagnosis Date   Anxiety    Hypercholesteremia    Hypertension    Lung cancer (HCC)    Neurofibromatosis, type 1 [facility])    saw Dermatology [facility] 2023    Surgical History: Past Surgical History:  Procedure Laterality Date   ABDOMINAL HYSTERECTOMY     PORTACATH PLACEMENT Right 10/12/2023   Procedure: INSERTION PORT-A-CATH;  Surgeon: Lucretia Roers, MD;  Location: [facility];  Service: General;  Laterality: Right;    Social History: Social History   Socioeconomic History   Marital status: Single    Spouse name: Not on file   Number of children: Not on file   Years of education: Not on file   Highest education level: Not on file  Occupational History   Not on file  Tobacco Use   Smoking status: Some Days    Types: Cigarettes   Smokeless tobacco: Never  Vaping Use   Vaping status: Never Used  Substance and Sexual Activity   Alcohol use: Not Currently   Drug use: Never   Sexual activity: Not on file  Other Topics Concern   Not on file  Social History Narrative   Not on file   Social Drivers of Health   Financial  Resource Strain: Not on file  Food Insecurity: Not on file  Transportation Needs: Not on file  Physical Activity: Not on file  Stress: Not on file  Social Connections: Unknown (06/14/2022)   Received from [facility]   Social Network    Social Network: Not on file  Intimate Partner Violence: Unknown (06/14/2022)   Received from [employer], [facility]   HITS    Physically Hurt: Not on file    Insult or Talk Down To: Not on file    Threaten Physical Harm: Not on file    Scream or Curse: Not on file    Family History: No family history on file.  Current Medications:  Current Outpatient Medications:    amLODipine (NORVASC) 5 MG tablet,  Take 5 mg by mouth in the morning., Disp: , Rfl:    aspirin EC 81 MG tablet, Take 81 mg by mouth in the morning., Disp: , Rfl:    dexamethasone (DECADRON) 2 MG tablet, Take 1 tablet (2 mg total) by mouth daily with breakfast., Disp: 7 tablet, Rfl: 0   fluticasone (FLONASE) 50 MCG/ACT nasal spray, Place 1 spray into both nostrils daily as needed for allergies., Disp: , Rfl:    Fosaprepitant Dimeglumine (EMEND IV), Inject into the vein., Disp: , Rfl:    gabapentin (NEURONTIN) 300 MG capsule, Take 1 capsule (300 mg total) by mouth See admin instructions. Take 1 capsule (300 mg) by mouth scheduled at bedtime, may take up to 2 additional dose if needed for pain., Disp: 90 capsule, Rfl: 11   hydrOXYzine (ATARAX) 25 MG tablet, Take 25 mg by mouth daily as needed for anxiety., Disp: , Rfl:    ibuprofen (ADVIL) 200 MG tablet, Take 400 mg by mouth every 8 (eight) hours as needed (pain)., Disp: , Rfl:    Lactulose 20 GM/30ML SOLN, Take 30 mLs (20 g total) by mouth daily as needed. Take 30 ml by mouth every 3 hours until you have a bowel movement, then use daily as needed for constipation, Disp: 450 mL, Rfl: 1   lidocaine-prilocaine (EMLA) cream, Apply 1 Application topically as needed., Disp: 30 g, Rfl: 0   megestrol (MEGACE) 400 MG/10ML  suspension, Take 10 mLs (400 mg total) by mouth 2 (two) times daily., Disp: 480 mL, Rfl: 2   mirtazapine (REMERON SOL-TAB) 45 MG disintegrating tablet, Take 45 mg by mouth at bedtime., Disp: , Rfl:    naproxen sodium (ALEVE) 220 MG tablet, Take 220-440 mg by mouth 2 (two) times daily as needed (pain.)., Disp: , Rfl:    ondansetron (ZOFRAN) 4 MG tablet, Take 1 tablet (4 mg total) by mouth every 8 (eight) hours as needed., Disp: 30 tablet, Rfl: 1   oxyCODONE (ROXICODONE) 5 MG immediate release tablet, Take 1 tablet (5 mg total) by mouth every 4 (four) hours as needed for severe pain (pain score 7-10) or breakthrough pain., Disp: 4 tablet, Rfl: 0   oxyCODONE-acetaminophen (PERCOCET) 10-325 MG tablet, Take 1 tablet by mouth every 4 (four) hours as needed for pain., Disp: 60 tablet, Rfl: 0   Palonosetron HCl (ALOXI IV), Inject into the vein., Disp: , Rfl:    potassium chloride SA (KLOR-CON M) 20 MEQ tablet, Take 1 tablet (20 mEq total) by mouth daily., Disp: 30 tablet, Rfl: 5   pravastatin (PRAVACHOL) 40 MG tablet, Take 40 mg by mouth daily., Disp: , Rfl:    prochlorperazine (COMPAZINE) 10 MG tablet, Take 1 tablet (10 mg total) by mouth every 6 (six) hours as needed for nausea or vomiting., Disp: 60 tablet, Rfl: 2   prochlorperazine (COMPAZINE) 10 MG tablet, Take 1 tablet (10 mg total) by mouth every 6 (six) hours as needed for nausea or vomiting., Disp: 30 tablet, Rfl: 0   sertraline (ZOLOFT) 100 MG tablet, Take 100 mg by mouth in the morning., Disp: , Rfl:  No current facility-administered medications for this visit.  Facility-Administered Medications Ordered in Other Visits:    0.9 %  sodium chloride infusion, , Intravenous, Continuous, Doreatha Massed, MD, Last Rate: 10 mL/hr at 11/21/23 0921, New Bag at 11/21/23 0921   CARBOplatin (PARAPLATIN) 520 mg in sodium chloride 0.9 % 250 mL chemo infusion, 520 mg, Intravenous, Once, Doreatha Massed, MD   famotidine (PEPCID) IVPB 20 mg premix,  20  mg, Intravenous, Once, Doreatha Massed, MD, Last Rate: 200 mL/hr at 11/21/23 1003, 20 mg at 11/21/23 1003   heparin lock flush 100 unit/mL, 500 Units, Intracatheter, Once PRN, Doreatha Massed, MD   nivolumab (OPDIVO) 360 mg in sodium chloride 0.9 % 100 mL chemo infusion, 360 mg, Intravenous, Once, Doreatha Massed, MD   PACLitaxel (TAXOL) 270 mg in sodium chloride 0.9 % 250 mL chemo infusion (> 80mg /m2), 175 mg/m2 (Treatment Plan Recorded), Intravenous, Once, Doreatha Massed, MD   sodium chloride flush (NS) 0.9 % injection 10 mL, 10 mL, Intracatheter, PRN, Doreatha Massed, MD   Allergies: No Known Allergies  REVIEW OF SYSTEMS:   Review of Systems  Constitutional:  Negative for chills, fatigue and fever.  HENT:   Negative for lump/mass, mouth sores, nosebleeds, sore throat and trouble swallowing.   Eyes:  Negative for eye problems.  Respiratory:  Negative for cough and shortness of breath.   Cardiovascular:  Positive for chest pain (left side, occasional). Negative for leg swelling and palpitations.  Gastrointestinal:  Negative for abdominal pain, constipation, diarrhea, nausea and vomiting.  Genitourinary:  Negative for bladder incontinence, difficulty urinating, dysuria, frequency, hematuria and nocturia.   Musculoskeletal:  Negative for arthralgias, back pain, flank pain, myalgias and neck pain.  Skin:  Negative for itching and rash.  Neurological:  Negative for dizziness, headaches and numbness.  Hematological:  Does not bruise/bleed easily.  Psychiatric/Behavioral:  Positive for sleep disturbance. Negative for depression and suicidal ideas. The patient is not nervous/anxious.   All other systems reviewed and are negative.    VITALS:   Temperature 98 F (36.7 C), temperature source Tympanic.  Wt Readings from Last 3 Encounters:  11/21/23 117 lb 4.6 oz (53.2 kg)  11/07/23 113 lb 8.6 oz (51.5 kg)  10/25/23 119 lb 0.8 oz (54 kg)    There is no height or  weight on file to calculate BMI.  Performance status (ECOG): 1 - Symptomatic but completely ambulatory  PHYSICAL EXAM:   Physical Exam Vitals and nursing note reviewed. Exam conducted with a chaperone present.  Constitutional:      Appearance: Normal appearance.  Cardiovascular:     Rate and Rhythm: Normal rate and regular rhythm.     Pulses: Normal pulses.     Heart sounds: Normal heart sounds.  Pulmonary:     Effort: Pulmonary effort is normal.     Breath sounds: Normal breath sounds.  Abdominal:     Palpations: Abdomen is soft. There is no hepatomegaly, splenomegaly or mass.     Tenderness: There is no abdominal tenderness.  Musculoskeletal:     Right lower leg: No edema.     Left lower leg: No edema.  Lymphadenopathy:     Cervical: No cervical adenopathy.     Right cervical: No superficial, deep or posterior cervical adenopathy.    Left cervical: No superficial, deep or posterior cervical adenopathy.     Upper Body:     Right upper body: No supraclavicular or axillary adenopathy.     Left upper body: No supraclavicular or axillary adenopathy.  Neurological:     General: No focal deficit present.     Mental Status: Denise Rivas is alert and oriented to person, place, and time.  Psychiatric:        Mood and Affect: Mood normal.        Behavior: Behavior normal.     LABS:   CBC     Component Value Date/Time   WBC 15.7 (H) 11/21/2023  0813   RBC 3.31 (L) 11/21/2023 0813   HGB 9.7 (L) 11/21/2023 0813   HCT 30.6 (L) 11/21/2023 0813   PLT 638 (H) 11/21/2023 0813   MCV 92.4 11/21/2023 0813   MCH 29.3 11/21/2023 0813   MCHC 31.7 11/21/2023 0813   RDW 19.5 (H) 11/21/2023 0813   LYMPHSABS 2.0 11/21/2023 0813   MONOABS 1.7 (H) 11/21/2023 0813   EOSABS 0.0 11/21/2023 0813   BASOSABS 0.0 11/21/2023 0813    CMP      Component Value Date/Time   NA 137 11/21/2023 0813   K 4.0 11/21/2023 0813   CL 107 11/21/2023 0813   CO2 18 (L) 11/21/2023 0813   GLUCOSE 85 11/21/2023  0813   BUN 14 11/21/2023 0813   CREATININE 0.43 (L) 11/21/2023 0813   CALCIUM 9.0 11/21/2023 0813   PROT 7.3 11/21/2023 0813   ALBUMIN 2.7 (L) 11/21/2023 0813   AST 15 11/21/2023 0813   ALT 20 11/21/2023 0813   ALKPHOS 92 11/21/2023 0813   BILITOT 0.3 11/21/2023 0813   GFRNONAA >60 11/21/2023 0813   GFRAA >60 02/18/2020 0932     No results found for: "CEA1", "CEA" / No results found for: "CEA1", "CEA" No results found for: "PSA1" No results found for: "ZOX096" No results found for: "CAN125"  No results found for: "TOTALPROTELP", "ALBUMINELP", "A1GS", "A2GS", "BETS", "BETA2SER", "GAMS", "MSPIKE", "SPEI" No results found for: "TIBC", "FERRITIN", "IRONPCTSAT" No results found for: "LDH"   STUDIES:   No results found.

## 2023-11-21 ENCOUNTER — Other Ambulatory Visit: Payer: Self-pay | Admitting: *Deleted

## 2023-11-21 ENCOUNTER — Inpatient Hospital Stay: Attending: Hematology

## 2023-11-21 ENCOUNTER — Inpatient Hospital Stay: Admitting: Hematology

## 2023-11-21 ENCOUNTER — Other Ambulatory Visit: Payer: Self-pay | Admitting: Hematology

## 2023-11-21 ENCOUNTER — Inpatient Hospital Stay

## 2023-11-21 VITALS — BP 112/73 | HR 103 | Temp 97.2°F | Resp 19

## 2023-11-21 VITALS — Temp 98.0°F

## 2023-11-21 DIAGNOSIS — Z5111 Encounter for antineoplastic chemotherapy: Secondary | ICD-10-CM | POA: Diagnosis not present

## 2023-11-21 DIAGNOSIS — C3412 Malignant neoplasm of upper lobe, left bronchus or lung: Secondary | ICD-10-CM | POA: Diagnosis present

## 2023-11-21 DIAGNOSIS — F17210 Nicotine dependence, cigarettes, uncomplicated: Secondary | ICD-10-CM | POA: Diagnosis not present

## 2023-11-21 DIAGNOSIS — K118 Other diseases of salivary glands: Secondary | ICD-10-CM

## 2023-11-21 DIAGNOSIS — R918 Other nonspecific abnormal finding of lung field: Secondary | ICD-10-CM

## 2023-11-21 DIAGNOSIS — Z79899 Other long term (current) drug therapy: Secondary | ICD-10-CM | POA: Diagnosis not present

## 2023-11-21 DIAGNOSIS — E876 Hypokalemia: Secondary | ICD-10-CM | POA: Insufficient documentation

## 2023-11-21 LAB — CBC WITH DIFFERENTIAL/PLATELET
Abs Immature Granulocytes: 0.28 10*3/uL — ABNORMAL HIGH (ref 0.00–0.07)
Basophils Absolute: 0 10*3/uL (ref 0.0–0.1)
Basophils Relative: 0 %
Eosinophils Absolute: 0 10*3/uL (ref 0.0–0.5)
Eosinophils Relative: 0 %
HCT: 30.6 % — ABNORMAL LOW (ref 36.0–46.0)
Hemoglobin: 9.7 g/dL — ABNORMAL LOW (ref 12.0–15.0)
Immature Granulocytes: 2 %
Lymphocytes Relative: 13 %
Lymphs Abs: 2 10*3/uL (ref 0.7–4.0)
MCH: 29.3 pg (ref 26.0–34.0)
MCHC: 31.7 g/dL (ref 30.0–36.0)
MCV: 92.4 fL (ref 80.0–100.0)
Monocytes Absolute: 1.7 10*3/uL — ABNORMAL HIGH (ref 0.1–1.0)
Monocytes Relative: 11 %
Neutro Abs: 11.7 10*3/uL — ABNORMAL HIGH (ref 1.7–7.7)
Neutrophils Relative %: 74 %
Platelets: 638 10*3/uL — ABNORMAL HIGH (ref 150–400)
RBC: 3.31 MIL/uL — ABNORMAL LOW (ref 3.87–5.11)
RDW: 19.5 % — ABNORMAL HIGH (ref 11.5–15.5)
WBC: 15.7 10*3/uL — ABNORMAL HIGH (ref 4.0–10.5)
nRBC: 0 % (ref 0.0–0.2)

## 2023-11-21 LAB — COMPREHENSIVE METABOLIC PANEL WITH GFR
ALT: 20 U/L (ref 0–44)
AST: 15 U/L (ref 15–41)
Albumin: 2.7 g/dL — ABNORMAL LOW (ref 3.5–5.0)
Alkaline Phosphatase: 92 U/L (ref 38–126)
Anion gap: 12 (ref 5–15)
BUN: 14 mg/dL (ref 8–23)
CO2: 18 mmol/L — ABNORMAL LOW (ref 22–32)
Calcium: 9 mg/dL (ref 8.9–10.3)
Chloride: 107 mmol/L (ref 98–111)
Creatinine, Ser: 0.43 mg/dL — ABNORMAL LOW (ref 0.44–1.00)
GFR, Estimated: >60 mL/min (ref >60)
Glucose, Bld: 85 mg/dL (ref 70–99)
Potassium: 4 mmol/L (ref 3.5–5.1)
Sodium: 137 mmol/L (ref 135–145)
Total Bilirubin: 0.3 mg/dL (ref 0.0–1.2)
Total Protein: 7.3 g/dL (ref 6.5–8.1)

## 2023-11-21 LAB — MAGNESIUM: Magnesium: 2 mg/dL (ref 1.7–2.4)

## 2023-11-21 MED ORDER — DEXAMETHASONE SODIUM PHOSPHATE 10 MG/ML IJ SOLN
10.0000 mg | Freq: Once | INTRAMUSCULAR | Status: AC
  Administered 2023-11-21: 10 mg via INTRAVENOUS
  Filled 2023-11-21: qty 1

## 2023-11-21 MED ORDER — PALONOSETRON HCL INJECTION 0.25 MG/5ML
0.2500 mg | Freq: Once | INTRAVENOUS | Status: AC
  Administered 2023-11-21: 0.25 mg via INTRAVENOUS
  Filled 2023-11-21: qty 5

## 2023-11-21 MED ORDER — SODIUM CHLORIDE 0.9 % IV SOLN
518.4000 mg | Freq: Once | INTRAVENOUS | Status: AC
  Administered 2023-11-21: 520 mg via INTRAVENOUS
  Filled 2023-11-21: qty 52

## 2023-11-21 MED ORDER — DIPHENHYDRAMINE HCL 50 MG/ML IJ SOLN
50.0000 mg | Freq: Once | INTRAMUSCULAR | Status: AC
  Administered 2023-11-21: 50 mg via INTRAVENOUS
  Filled 2023-11-21: qty 1

## 2023-11-21 MED ORDER — FAMOTIDINE IN NACL 20-0.9 MG/50ML-% IV SOLN
20.0000 mg | Freq: Once | INTRAVENOUS | Status: AC
  Administered 2023-11-21: 20 mg via INTRAVENOUS
  Filled 2023-11-21: qty 50

## 2023-11-21 MED ORDER — SODIUM CHLORIDE 0.9 % IV SOLN: INTRAVENOUS | Status: DC

## 2023-11-21 MED ORDER — PACLITAXEL CHEMO INJECTION 300 MG/50ML
175.0000 mg/m2 | Freq: Once | INTRAVENOUS | Status: AC
  Administered 2023-11-21: 270 mg via INTRAVENOUS
  Filled 2023-11-21: qty 45

## 2023-11-21 MED ORDER — SODIUM CHLORIDE 0.9 % IV SOLN
360.0000 mg | Freq: Once | INTRAVENOUS | Status: AC
  Administered 2023-11-21: 360 mg via INTRAVENOUS
  Filled 2023-11-21: qty 24

## 2023-11-21 MED ORDER — OXYCODONE-ACETAMINOPHEN 10-325 MG PO TABS: 1.0000 | ORAL_TABLET | Freq: Two times a day (BID) | ORAL | 0 refills | Status: DC | PRN

## 2023-11-21 MED ORDER — HEPARIN SOD (PORK) LOCK FLUSH 100 UNIT/ML IV SOLN
500.0000 [IU] | Freq: Once | INTRAVENOUS | Status: AC | PRN
  Administered 2023-11-21: 500 [IU]

## 2023-11-21 MED ORDER — SODIUM CHLORIDE 0.9 % IV SOLN
150.0000 mg | Freq: Once | INTRAVENOUS | Status: AC
  Administered 2023-11-21: 150 mg via INTRAVENOUS
  Filled 2023-11-21: qty 150

## 2023-11-21 MED ORDER — SODIUM CHLORIDE 0.9% FLUSH
10.0000 mL | Freq: Once | INTRAVENOUS | Status: AC
  Administered 2023-11-21: 10 mL via INTRAVENOUS

## 2023-11-21 MED ORDER — SODIUM CHLORIDE 0.9% FLUSH
10.0000 mL | INTRAVENOUS | Status: DC | PRN
  Administered 2023-11-21: 10 mL

## 2023-11-21 NOTE — Patient Instructions (Signed)

## 2023-11-21 NOTE — Progress Notes (Signed)
Patient has been examined by Dr. Ellin Saba. Vital signs (HR 103) and labs have been reviewed by MD - ANC, Creatinine, LFTs, hemoglobin, and platelets are within treatment parameters per M.D. - pt may proceed with treatment.  Primary RN and pharmacy notified.

## 2023-11-21 NOTE — Patient Instructions (Signed)
 [facility] [place] [facility]  Discharge Instructions: Thank you for choosing [facility] to provide your oncology and hematology care.  If you have a lab appointment with the Cancer Center - please note that after April 8th, 2024, all labs will be drawn in the cancer center.  You do not have to check in or register with the main entrance as you have in the past but will complete your check-in in the cancer center.  Wear comfortable clothing and clothing appropriate for easy access to any Portacath or PICC line.   We strive to give you quality time with your provider. You may need to reschedule your appointment if you arrive late (15 or more minutes).  Arriving late affects you and other patients whose appointments are after yours.  Also, if you miss three or more appointments without notifying the office, you may be dismissed from the clinic at the provider's discretion.      For prescription refill requests, have your pharmacy contact our office and allow 72 hours for refills to be completed.    Today you received the following chemotherapy and/or immunotherapy agents Opdivo/Taxol/Carboplatin      To help prevent nausea and vomiting after your treatment, we encourage you to take your nausea medication as directed.  BELOW ARE SYMPTOMS THAT SHOULD BE REPORTED IMMEDIATELY: *FEVER GREATER THAN 100.4 F (38 C) OR HIGHER *CHILLS OR SWEATING *NAUSEA AND VOMITING THAT IS NOT CONTROLLED WITH YOUR NAUSEA MEDICATION *UNUSUAL SHORTNESS OF BREATH *UNUSUAL BRUISING OR BLEEDING *URINARY PROBLEMS (pain or burning when urinating, or frequent urination) *BOWEL PROBLEMS (unusual diarrhea, constipation, pain near the anus) TENDERNESS IN MOUTH AND THROAT WITH OR WITHOUT PRESENCE OF ULCERS (sore throat, sores in mouth, or a toothache) UNUSUAL RASH, SWELLING OR PAIN  UNUSUAL VAGINAL DISCHARGE OR ITCHING   Items with * indicate a potential emergency and should  be followed up as soon as possible or go to the Emergency Department if any problems should occur.  Please show the CHEMOTHERAPY ALERT CARD or IMMUNOTHERAPY ALERT CARD at check-in to the Emergency Department and triage nurse.  Should you have questions after your visit or need to cancel or reschedule your appointment, please contact [facility] [place] - A DEPT OF Eligha Bridegroom [facility] [phone]  and follow the prompts.  Office hours are 8:00 a.m. to 4:30 p.m. Monday - Friday. Please note that voicemails left after 4:00 p.m. may not be returned until the following business day.  We are closed weekends and major holidays. You have access to a nurse at all times for urgent questions. Please call the main number to the clinic [phone] and follow the prompts.  For any non-urgent questions, you may also contact your provider using MyChart. We now offer e-Visits for anyone 32 and older to request care online for non-urgent symptoms. For details visit [email or web address].   Also download the MyChart app! Go to the app store, search "MyChart", open the app, select [place], and log in with your MyChart username and password.

## 2023-11-21 NOTE — Progress Notes (Signed)
 Patient presents today for Opdivo/Taxol/Carboplatin per providers order.  Vital signs and labs reviewed by MD. Message received from Chapman Moss RN/Dr. Ellin Saba patient okay for treatment.  Treatment given today per MD orders.  Stable during infusion without adverse affects.  Vital signs stable.  No complaints at this time.  Discharge from clinic ambulatory in stable condition.  Alert and oriented X 3.  Follow up with [facility] as scheduled.

## 2023-11-23 ENCOUNTER — Inpatient Hospital Stay

## 2023-11-23 VITALS — BP 121/75 | HR 96 | Temp 96.6°F | Resp 18

## 2023-11-23 DIAGNOSIS — C3412 Malignant neoplasm of upper lobe, left bronchus or lung: Secondary | ICD-10-CM

## 2023-11-23 DIAGNOSIS — Z5111 Encounter for antineoplastic chemotherapy: Secondary | ICD-10-CM | POA: Diagnosis not present

## 2023-11-23 MED ORDER — PEGFILGRASTIM-CBQV 6 MG/0.6ML ~~LOC~~ SOSY
6.0000 mg | PREFILLED_SYRINGE | Freq: Once | SUBCUTANEOUS | Status: AC
  Administered 2023-11-23: 6 mg via SUBCUTANEOUS
  Filled 2023-11-23: qty 0.6

## 2023-11-23 NOTE — Progress Notes (Signed)
Udenyca injection given per orders.Patient tolerated it well without problems. Vitals stable and discharged home from clinic ambulatory. Follow up as scheduled.

## 2023-11-23 NOTE — Patient Instructions (Signed)
 [facility] [place] [facility]  Discharge Instructions: Thank you for choosing [facility] to provide your oncology and hematology care.  If you have a lab appointment with the Cancer Center - please note that after April 8th, 2024, all labs will be drawn in the cancer center.  You do not have to check in or register with the main entrance as you have in the past but will complete your check-in in the cancer center.  Wear comfortable clothing and clothing appropriate for easy access to any Portacath or PICC line.   We strive to give you quality time with your provider. You may need to reschedule your appointment if you arrive late (15 or more minutes).  Arriving late affects you and other patients whose appointments are after yours.  Also, if you miss three or more appointments without notifying the office, you may be dismissed from the clinic at the provider's discretion.      For prescription refill requests, have your pharmacy contact our office and allow 72 hours for refills to be completed.    Today you received the following injection: udenyca   To help prevent nausea and vomiting after your treatment, we encourage you to take your nausea medication as directed.  BELOW ARE SYMPTOMS THAT SHOULD BE REPORTED IMMEDIATELY: *FEVER GREATER THAN 100.4 F (38 C) OR HIGHER *CHILLS OR SWEATING *NAUSEA AND VOMITING THAT IS NOT CONTROLLED WITH YOUR NAUSEA MEDICATION *UNUSUAL SHORTNESS OF BREATH *UNUSUAL BRUISING OR BLEEDING *URINARY PROBLEMS (pain or burning when urinating, or frequent urination) *BOWEL PROBLEMS (unusual diarrhea, constipation, pain near the anus) TENDERNESS IN MOUTH AND THROAT WITH OR WITHOUT PRESENCE OF ULCERS (sore throat, sores in mouth, or a toothache) UNUSUAL RASH, SWELLING OR PAIN  UNUSUAL VAGINAL DISCHARGE OR ITCHING   Items with * indicate a potential emergency and should be followed up as soon as possible or go to the  Emergency Department if any problems should occur.  Please show the CHEMOTHERAPY ALERT CARD or IMMUNOTHERAPY ALERT CARD at check-in to the Emergency Department and triage nurse.  Should you have questions after your visit or need to cancel or reschedule your appointment, please contact [facility]  - A DEPT OF [facility]groom [facility] [phone]  and follow the prompts.  Office hours are 8:00 a.m. to 4:30 p.m. Monday - Friday. Please note that voicemails left after 4:00 p.m. may not be returned until the following business day.  We are closed weekends and major holidays. You have access to a nurse at all times for urgent questions. Please call the main number to the clinic [phone] and follow the prompts.  For any non-urgent questions, you may also contact your provider using MyChart. We now offer e-Visits for anyone 68 and older to request care online for non-urgent symptoms. For details visit [email or web address].   Also download the MyChart app! Go to the app store, search "MyChart", open the app, select [place], and log in with your MyChart username and password.

## 2023-11-27 ENCOUNTER — Inpatient Hospital Stay: Admitting: Dietician

## 2023-11-27 ENCOUNTER — Telehealth: Payer: Self-pay | Admitting: Dietician

## 2023-11-27 NOTE — Telephone Encounter (Signed)
 Nutrition Follow-up:  Pt stage II SCC of LUL. She is receiving neoadjuvant chemoimmunotherapy with carboplatin/paclitaxel + nivolumab q21d. Patient is under the care of Dr. Cheree Cords.   Spoke with patient via telephone. She reports tolerating fist therapy well. Patient continues to struggle with appetite. Says she is making herself eat. Patient reports trying megace for a couple of days, however discontinued as this did not make her hungry. This morning pt had a honey bun with a soda. Planning on eating bugels and drinking another soda for lunch. She is going to make meatloaf and potatoes for dinner. She is drinking one Ensure. Patient denies nausea, vomiting, diarrhea, constipation.   Medications: reviewed   Labs: 4/8 - Hgb 9.7, albumin 2.7, Cr 0.43  Anthropometrics: Wt 117 lb 4.6 oz on 4/8 - increased  3/25 - 113 lb 8.6 oz 3/12 - 119 lb 0.8 oz 2/27 - 124 lb 3.2 oz   NUTRITION DIAGNOSIS: Unintended wt loss - improving    INTERVENTION:  Reviewed foods with protein, recommend protein source at every meal - suggestions provided (drink milk with honey bun vs soda, eat a sandwich with chips and drink milk) Continue daily Ensure Plus/equivalent - coupons mailed (will leave samples for pt to p/u at 4/29 office visit)    MONITORING, EVALUATION, GOAL: wt trends, intake   NEXT VISIT: Thursday May 1 during injection

## 2023-12-12 ENCOUNTER — Inpatient Hospital Stay

## 2023-12-12 ENCOUNTER — Inpatient Hospital Stay (HOSPITAL_BASED_OUTPATIENT_CLINIC_OR_DEPARTMENT_OTHER): Admitting: Hematology

## 2023-12-12 ENCOUNTER — Other Ambulatory Visit: Payer: Self-pay | Admitting: *Deleted

## 2023-12-12 VITALS — Wt 124.9 lb

## 2023-12-12 VITALS — BP 130/76 | HR 101 | Resp 19

## 2023-12-12 DIAGNOSIS — C3412 Malignant neoplasm of upper lobe, left bronchus or lung: Secondary | ICD-10-CM | POA: Diagnosis not present

## 2023-12-12 DIAGNOSIS — Z5111 Encounter for antineoplastic chemotherapy: Secondary | ICD-10-CM | POA: Diagnosis not present

## 2023-12-12 DIAGNOSIS — R918 Other nonspecific abnormal finding of lung field: Secondary | ICD-10-CM

## 2023-12-12 DIAGNOSIS — K118 Other diseases of salivary glands: Secondary | ICD-10-CM

## 2023-12-12 LAB — CBC WITH DIFFERENTIAL/PLATELET
Abs Immature Granulocytes: 0.3 10*3/uL — ABNORMAL HIGH (ref 0.00–0.07)
Basophils Absolute: 0.1 10*3/uL (ref 0.0–0.1)
Basophils Relative: 1 %
Eosinophils Absolute: 0 10*3/uL (ref 0.0–0.5)
Eosinophils Relative: 0 %
HCT: 33.7 % — ABNORMAL LOW (ref 36.0–46.0)
Hemoglobin: 10.4 g/dL — ABNORMAL LOW (ref 12.0–15.0)
Immature Granulocytes: 2 %
Lymphocytes Relative: 14 %
Lymphs Abs: 2.2 10*3/uL (ref 0.7–4.0)
MCH: 30.5 pg (ref 26.0–34.0)
MCHC: 30.9 g/dL (ref 30.0–36.0)
MCV: 98.8 fL (ref 80.0–100.0)
Monocytes Absolute: 2.4 10*3/uL — ABNORMAL HIGH (ref 0.1–1.0)
Monocytes Relative: 15 %
Neutro Abs: 10.9 10*3/uL — ABNORMAL HIGH (ref 1.7–7.7)
Neutrophils Relative %: 68 %
Platelets: 440 10*3/uL — ABNORMAL HIGH (ref 150–400)
RBC: 3.41 MIL/uL — ABNORMAL LOW (ref 3.87–5.11)
RDW: 24.5 % — ABNORMAL HIGH (ref 11.5–15.5)
Smear Review: NORMAL
WBC: 15.8 10*3/uL — ABNORMAL HIGH (ref 4.0–10.5)
nRBC: 0 % (ref 0.0–0.2)

## 2023-12-12 LAB — COMPREHENSIVE METABOLIC PANEL WITH GFR
ALT: 14 U/L (ref 0–44)
AST: 13 U/L — ABNORMAL LOW (ref 15–41)
Albumin: 3.1 g/dL — ABNORMAL LOW (ref 3.5–5.0)
Alkaline Phosphatase: 82 U/L (ref 38–126)
Anion gap: 7 (ref 5–15)
BUN: 14 mg/dL (ref 8–23)
CO2: 20 mmol/L — ABNORMAL LOW (ref 22–32)
Calcium: 8.9 mg/dL (ref 8.9–10.3)
Chloride: 110 mmol/L (ref 98–111)
Creatinine, Ser: 0.44 mg/dL (ref 0.44–1.00)
GFR, Estimated: >60 mL/min (ref >60)
Glucose, Bld: 82 mg/dL (ref 70–99)
Potassium: 3.6 mmol/L (ref 3.5–5.1)
Sodium: 137 mmol/L (ref 135–145)
Total Bilirubin: 0.2 mg/dL (ref 0.0–1.2)
Total Protein: 7 g/dL (ref 6.5–8.1)

## 2023-12-12 LAB — MAGNESIUM: Magnesium: 2 mg/dL (ref 1.7–2.4)

## 2023-12-12 MED ORDER — DEXAMETHASONE SODIUM PHOSPHATE 10 MG/ML IJ SOLN
10.0000 mg | Freq: Once | INTRAMUSCULAR | Status: AC
  Administered 2023-12-12: 10 mg via INTRAVENOUS
  Filled 2023-12-12: qty 1

## 2023-12-12 MED ORDER — OXYCODONE-ACETAMINOPHEN 10-325 MG PO TABS: 1.0000 | ORAL_TABLET | Freq: Two times a day (BID) | ORAL | 0 refills | Status: DC | PRN

## 2023-12-12 MED ORDER — SODIUM CHLORIDE 0.9 % IV SOLN
150.0000 mg | Freq: Once | INTRAVENOUS | Status: AC
  Administered 2023-12-12: 150 mg via INTRAVENOUS
  Filled 2023-12-12: qty 150

## 2023-12-12 MED ORDER — SODIUM CHLORIDE 0.9% FLUSH
10.0000 mL | Freq: Once | INTRAVENOUS | Status: AC
  Administered 2023-12-12: 10 mL via INTRAVENOUS

## 2023-12-12 MED ORDER — DIPHENHYDRAMINE HCL 50 MG/ML IJ SOLN: 50.0000 mg | Freq: Once | INTRAMUSCULAR | Status: DC

## 2023-12-12 MED ORDER — SODIUM CHLORIDE 0.9 % IV SOLN: INTRAVENOUS | Status: DC

## 2023-12-12 MED ORDER — SODIUM CHLORIDE 0.9% FLUSH
10.0000 mL | INTRAVENOUS | Status: DC | PRN
  Administered 2023-12-12: 10 mL

## 2023-12-12 MED ORDER — SODIUM CHLORIDE 0.9 % IV SOLN
360.0000 mg | Freq: Once | INTRAVENOUS | Status: AC
  Administered 2023-12-12: 360 mg via INTRAVENOUS
  Filled 2023-12-12: qty 24

## 2023-12-12 MED ORDER — FAMOTIDINE IN NACL 20-0.9 MG/50ML-% IV SOLN
20.0000 mg | Freq: Once | INTRAVENOUS | Status: AC
Start: 2023-12-12 — End: 2023-12-12
  Administered 2023-12-12: 20 mg via INTRAVENOUS
  Filled 2023-12-12: qty 50

## 2023-12-12 MED ORDER — SODIUM CHLORIDE 0.9 % IV SOLN
518.4000 mg | Freq: Once | INTRAVENOUS | Status: AC
  Administered 2023-12-12: 520 mg via INTRAVENOUS
  Filled 2023-12-12: qty 52

## 2023-12-12 MED ORDER — HEPARIN SOD (PORK) LOCK FLUSH 100 UNIT/ML IV SOLN
500.0000 [IU] | Freq: Once | INTRAVENOUS | Status: AC | PRN
Start: 2023-12-12 — End: 2023-12-12
  Administered 2023-12-12: 500 [IU]

## 2023-12-12 MED ORDER — PALONOSETRON HCL INJECTION 0.25 MG/5ML
0.2500 mg | Freq: Once | INTRAVENOUS | Status: AC
Start: 2023-12-12 — End: 2023-12-12
  Administered 2023-12-12: 0.25 mg via INTRAVENOUS
  Filled 2023-12-12: qty 5

## 2023-12-12 MED ORDER — SODIUM CHLORIDE 0.9 % IV SOLN
175.0000 mg/m2 | Freq: Once | INTRAVENOUS | Status: AC
  Administered 2023-12-12: 270 mg via INTRAVENOUS
  Filled 2023-12-12: qty 45

## 2023-12-12 MED ORDER — CETIRIZINE HCL 10 MG/ML IV SOLN
10.0000 mg | Freq: Once | INTRAVENOUS | Status: AC
  Administered 2023-12-12: 10 mg via INTRAVENOUS
  Filled 2023-12-12: qty 1

## 2023-12-12 NOTE — Progress Notes (Signed)
 [facility] [address] [place] [address]    Clinic Day:  12/12/2023  Referring physician: Joanne Muckle, FNP  Patient Care Team: Joanne Muckle, FNP as PCP - General (Family Medicine) Diamond Formica, MD as Consulting Physician (Pulmonary Disease)   ASSESSMENT & PLAN:   Assessment: 1.  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  Mass abuts anterior pleural surface.  There is mild cortical erosion along the inner aspect of the left anterior third rib abutting the left upper lobe mass consistent with direct invasion.  Indeterminate 5 mm subsolid LUL nodule too small to characterize. - Denies any fevers, night sweats or weight loss.  No hemoptysis. - She reports burning all over the body.  Also reports left chest wall pain, aching type preventing her to sleep on the left side.  She is taking Tylenol  as needed which helps. - Guardant360: PIK3CA E545K, PTEN deletion (exon 1), T p53, BLM - PET scan (09/01/2023): 7 cm anterior left upper lobe mass invading left chest wall with destruction of the left third anterior rib.  No adenopathy or distant metastatic disease.  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 - MRI brain (08/31/2022): No evidence of metastatic disease. - Left upper lobe biopsy (09/11/2023): Invasive moderately differentiated squamous cell carcinoma. - Cycle 1 of neoadjuvant carboplatin , paclitaxel  and nivolumab  on 10/31/2023   2. Social/Family History: -Lives at home alone and does not work. Tobacco use of 1 cigarette a day since her 20's. No chemical exposures.  -Mother had uterine cancer. 2 sisters had breast cancer.     Plan: 1.  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 - Labs today: Albumin improved to 3.1.  LFTs are normal.  CBC grossly normal. - Proceed with cycle 3 today without any dose modifications.  RTC 3 weeks for follow-up for cycle 4.  Will redo imaging after cycle 4.   2.  Left chest wall pain: - Continue Percocet 10/325 twice daily as needed.  Pain has improved after start of cycle 1.   3.  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  4.  Decreased appetite: - Continue Megace  twice daily.  She is also drinking Ensure 1 can per day.  She has gained weight.  5.  Hypokalemia: - Continue K-Dur 20 milliequivalents daily.  Potassium is 3.6.    Orders Placed This Encounter  Procedures   Magnesium    Standing Status:   Future    Expected Date:   12/12/2023    Expiration Date:   12/11/2024   CBC with Differential    Standing Status:   Future    Expected Date:   12/12/2023    Expiration Date:   12/11/2024   Comprehensive metabolic panel    Standing Status:   Future    Expected Date:   12/12/2023    Expiration Date:   12/11/2024   Magnesium    Standing Status:   Future    Expected Date:   01/02/2024    Expiration Date:   01/01/2025   CBC with Differential  Standing Status:   Future    Expected Date:   01/02/2024    Expiration Date:   01/01/2025   Comprehensive metabolic panel    Standing Status:   Future    Expected Date:   01/02/2024    Expiration Date:   01/01/2025      Denise Rivas,acting as a scribe for Paulett Boros, MD.,have documented all relevant documentation on the behalf of Paulett Boros, MD,as directed by  Paulett Boros, MD while in the presence of Paulett Boros, MD.  I, Paulett Boros MD, have reviewed the above documentation for accuracy and completeness, and I agree with the above.    Paulett Boros, MD   4/29/20253:35 PM  CHIEF COMPLAINT:   Diagnosis: LUL squamous cell lung  cancer   Cancer Staging  Primary squamous cell carcinoma of upper lobe of left lung [facility]) Staging form: Lung, AJCC V9 - Clinical stage from 09/20/2023: Stage IIB (cT3, cN0, cM0) - Unsigned    Prior Therapy: none  Current Therapy: Neoadjuvant chemoimmunotherapy   HISTORY OF PRESENT ILLNESS:   Oncology History  Primary squamous cell carcinoma of upper lobe of left lung (HCC)  09/20/2023 Initial Diagnosis   Primary squamous cell carcinoma of upper lobe of left lung (HCC)   10/31/2023 -  Chemotherapy   Patient is on Treatment Plan : LUNG Opdivo  + Carboplatin  + Paclitaxel  q21d        INTERVAL HISTORY:   Denise Rivas is a 63 y.o. female presenting to clinic today for follow up of LUL squamous cell lung cancer. She was last seen by me on 11/21/23.  Today, she states that she is doing well overall. Her appetite level is at 100%. Her energy level is at 100%.   Denise Rivas reports mild left chest wall pain and recently ran out of Percocet. She notes occasionally feeling "jittery" that she attributes to anxiety.   She is drinking 1-2 cans of Ensure a day and is taking Megace . She has gained 7 pounds since 11/21/23. Denise Rivas states she cannot swallow potassium pills and has been dissolving them in water.   PAST MEDICAL HISTORY:   Past Medical History: Past Medical History:  Diagnosis Date   Anxiety    Hypercholesteremia    Hypertension    Lung cancer (HCC)    Neurofibromatosis, type 1 [facility])    saw Dermatology [facility] 2023    Surgical History: Past Surgical History:  Procedure Laterality Date   ABDOMINAL HYSTERECTOMY     PORTACATH PLACEMENT Right 10/12/2023   Procedure: INSERTION PORT-A-CATH;  Surgeon: Awilda Bogus, MD;  Location: [facility];  Service: General;  Laterality: Right;    Social History: Social History   Socioeconomic History   Marital status: Single    Spouse name: Not on file   Number of children: Not on file   Years of education: Not on file   Highest education level: Not  on file  Occupational History   Not on file  Tobacco Use   Smoking status: Some Days    Types: Cigarettes   Smokeless tobacco: Never  Vaping Use   Vaping status: Never Used  Substance and Sexual Activity   Alcohol use: Not Currently   Drug use: Never   Sexual activity: Not on file  Other Topics Concern   Not on file  Social History Narrative   Not on file   Social Drivers of Health   Financial Resource Strain: Not on file  Food Insecurity: Not on file  Transportation Needs: Not  on file  Physical Activity: Not on file  Stress: Not on file  Social Connections: Unknown (06/14/2022)   Received from [facility]   Social Network    Social Network: Not on file  Intimate Partner Violence: Unknown (06/14/2022)   Received from [facility], [facility]   HITS    Physically Hurt: Not on file    Insult or Talk Down To: Not on file    Threaten Physical Harm: Not on file    Scream or Curse: Not on file    Family History: No family history on file.  Current Medications:  Current Outpatient Medications:    amLODipine (NORVASC) 5 MG tablet, Take 5 mg by mouth in the morning., Disp: , Rfl:    aspirin EC 81 MG tablet, Take 81 mg by mouth in the morning., Disp: , Rfl:    dexamethasone  (DECADRON ) 2 MG tablet, Take 1 tablet (2 mg total) by mouth daily with breakfast., Disp: 7 tablet, Rfl: 0   fluticasone (FLONASE) 50 MCG/ACT nasal spray, Place 1 spray into both nostrils daily as needed for allergies., Disp: , Rfl:    Fosaprepitant  Dimeglumine (EMEND IV), Inject into the vein., Disp: , Rfl:    gabapentin  (NEURONTIN ) 300 MG capsule, Take 1 capsule (300 mg total) by mouth See admin instructions. Take 1 capsule (300 mg) by mouth scheduled at bedtime, may take up to 2 additional dose if needed for pain., Disp: 90 capsule, Rfl: 11   hydrOXYzine (ATARAX) 25 MG tablet, Take 25 mg by mouth daily as needed for anxiety., Disp: , Rfl:    ibuprofen (ADVIL) 200 MG tablet, Take 400  mg by mouth every 8 (eight) hours as needed (pain)., Disp: , Rfl:    Lactulose  20 GM/30ML SOLN, Take 30 mLs (20 g total) by mouth daily as needed. Take 30 ml by mouth every 3 hours until you have a bowel movement, then use daily as needed for constipation, Disp: 450 mL, Rfl: 1   lidocaine -prilocaine  (EMLA ) cream, Apply 1 Application topically as needed., Disp: 30 g, Rfl: 0   megestrol  (MEGACE ) 400 MG/10ML suspension, Take 10 mLs (400 mg total) by mouth 2 (two) times daily., Disp: 480 mL, Rfl: 2   mirtazapine (REMERON SOL-TAB) 45 MG disintegrating tablet, Take 45 mg by mouth at bedtime., Disp: , Rfl:    naproxen sodium (ALEVE) 220 MG tablet, Take 220-440 mg by mouth 2 (two) times daily as needed (pain.)., Disp: , Rfl:    ondansetron  (ZOFRAN ) 4 MG tablet, Take 1 tablet (4 mg total) by mouth every 8 (eight) hours as needed., Disp: 30 tablet, Rfl: 1   oxyCODONE  (ROXICODONE ) 5 MG immediate release tablet, Take 1 tablet (5 mg total) by mouth every 4 (four) hours as needed for severe pain (pain score 7-10) or breakthrough pain., Disp: 4 tablet, Rfl: 0   Palonosetron  HCl (ALOXI  IV), Inject into the vein., Disp: , Rfl:    potassium chloride  SA (KLOR-CON  M) 20 MEQ tablet, Take 1 tablet (20 mEq total) by mouth daily., Disp: 30 tablet, Rfl: 5   pravastatin (PRAVACHOL) 40 MG tablet, Take 40 mg by mouth daily., Disp: , Rfl:    prochlorperazine  (COMPAZINE ) 10 MG tablet, Take 1 tablet (10 mg total) by mouth every 6 (six) hours as needed for nausea or vomiting., Disp: 60 tablet, Rfl: 2   prochlorperazine  (COMPAZINE ) 10 MG tablet, Take 1 tablet (10 mg total) by mouth every 6 (six) hours as needed for nausea or vomiting., Disp: 30 tablet,  Rfl: 0   sertraline (ZOLOFT) 100 MG tablet, Take 100 mg by mouth in the morning., Disp: , Rfl:    oxyCODONE -acetaminophen  (PERCOCET) 10-325 MG tablet, Take 1 tablet by mouth every 12 (twelve) hours as needed for pain., Disp: 60 tablet, Rfl: 0 No current facility-administered  medications for this visit.  Facility-Administered Medications Ordered in Other Visits:    0.9 %  sodium chloride  infusion, , Intravenous, Continuous, Paulett Boros, MD, Last Rate: 10 mL/hr at 12/12/23 1114, New Bag at 12/12/23 1114   CARBOplatin  (PARAPLATIN ) 520 mg in sodium chloride  0.9 % 250 mL chemo infusion, 520 mg, Intravenous, Once, Paulett Boros, MD   heparin  lock flush 100 unit/mL, 500 Units, Intracatheter, Once PRN, Howard Patton, MD   PACLitaxel  (TAXOL ) 270 mg in sodium chloride  0.9 % 250 mL chemo infusion (> 80mg /m2), 175 mg/m2 (Treatment Plan Recorded), Intravenous, Once, Paulett Boros, MD, Last Rate: 98 mL/hr at 12/12/23 1303, 270 mg at 12/12/23 1303   sodium chloride  flush (NS) 0.9 % injection 10 mL, 10 mL, Intracatheter, PRN, Beata Beason, MD   Allergies: No Known Allergies  REVIEW OF SYSTEMS:   Review of Systems  Constitutional:  Negative for chills, fatigue and fever.  HENT:   Negative for lump/mass, mouth sores, nosebleeds, sore throat and trouble swallowing.   Eyes:  Negative for eye problems.  Respiratory:  Negative for cough and shortness of breath.   Cardiovascular:  Negative for chest pain, leg swelling and palpitations.  Gastrointestinal:  Negative for abdominal pain, constipation, diarrhea, nausea and vomiting.  Genitourinary:  Negative for bladder incontinence, difficulty urinating, dysuria, frequency, hematuria and nocturia.   Musculoskeletal:  Negative for arthralgias, back pain, flank pain, myalgias and neck pain.       +left chest wall pain  Skin:  Negative for itching and rash.  Neurological:  Negative for dizziness, headaches and numbness.  Hematological:  Does not bruise/bleed easily.  Psychiatric/Behavioral:  Negative for depression, sleep disturbance and suicidal ideas. The patient is nervous/anxious.   All other systems reviewed and are negative.    VITALS:   Weight 124 lb 14.4 oz (56.7 kg).  Wt Readings from  Last 3 Encounters:  12/12/23 124 lb 14.4 oz (56.7 kg)  11/21/23 117 lb 4.6 oz (53.2 kg)  11/07/23 113 lb 8.6 oz (51.5 kg)    Body mass index is 23.43 kg/m.  Performance status (ECOG): 1 - Symptomatic but completely ambulatory  PHYSICAL EXAM:   Physical Exam Vitals and nursing note reviewed. Exam conducted with a chaperone present.  Constitutional:      Appearance: Normal appearance.  Cardiovascular:     Rate and Rhythm: Normal rate and regular rhythm.     Pulses: Normal pulses.     Heart sounds: Normal heart sounds.  Pulmonary:     Effort: Pulmonary effort is normal.     Breath sounds: Normal breath sounds.  Abdominal:     Palpations: Abdomen is soft. There is no hepatomegaly, splenomegaly or mass.     Tenderness: There is no abdominal tenderness.  Musculoskeletal:     Right lower leg: No edema.     Left lower leg: No edema.  Lymphadenopathy:     Cervical: No cervical adenopathy.     Right cervical: No superficial, deep or posterior cervical adenopathy.    Left cervical: No superficial, deep or posterior cervical adenopathy.     Upper Body:     Right upper body: No supraclavicular or axillary adenopathy.     Left upper body: No  supraclavicular or axillary adenopathy.  Neurological:     General: No focal deficit present.     Mental Status: She is alert and oriented to person, place, and time.  Psychiatric:        Mood and Affect: Mood normal.        Behavior: Behavior normal.     LABS:   CBC     Component Value Date/Time   WBC 15.8 (H) 12/12/2023 0914   RBC 3.41 (L) 12/12/2023 0914   HGB 10.4 (L) 12/12/2023 0914   HCT 33.7 (L) 12/12/2023 0914   PLT 440 (H) 12/12/2023 0914   MCV 98.8 12/12/2023 0914   MCH 30.5 12/12/2023 0914   MCHC 30.9 12/12/2023 0914   RDW 24.5 (H) 12/12/2023 0914   LYMPHSABS 2.2 12/12/2023 0914   MONOABS 2.4 (H) 12/12/2023 0914   EOSABS 0.0 12/12/2023 0914   BASOSABS 0.1 12/12/2023 0914    CMP      Component Value Date/Time   NA  137 12/12/2023 0914   K 3.6 12/12/2023 0914   CL 110 12/12/2023 0914   CO2 20 (L) 12/12/2023 0914   GLUCOSE 82 12/12/2023 0914   BUN 14 12/12/2023 0914   CREATININE 0.44 12/12/2023 0914   CALCIUM 8.9 12/12/2023 0914   PROT 7.0 12/12/2023 0914   ALBUMIN 3.1 (L) 12/12/2023 0914   AST 13 (L) 12/12/2023 0914   ALT 14 12/12/2023 0914   ALKPHOS 82 12/12/2023 0914   BILITOT 0.2 12/12/2023 0914   GFRNONAA >60 12/12/2023 0914   GFRAA >60 02/18/2020 0932     No results found for: "CEA1", "CEA" / No results found for: "CEA1", "CEA" No results found for: "PSA1" No results found for: "ZOX096" No results found for: "CAN125"  No results found for: "TOTALPROTELP", "ALBUMINELP", "A1GS", "A2GS", "BETS", "BETA2SER", "GAMS", "MSPIKE", "SPEI" No results found for: "TIBC", "FERRITIN", "IRONPCTSAT" No results found for: "LDH"   STUDIES:   No results found.

## 2023-12-12 NOTE — Patient Instructions (Signed)

## 2023-12-12 NOTE — Progress Notes (Signed)
 Patient presents today for chemotherapy infusion. Patient is in satisfactory condition with no new complaints voiced.  Vital signs are stable.  Labs reviewed by Dr. Ellin Saba during the office visit and all labs are within treatment parameters.  We will proceed with treatment per MD orders.

## 2023-12-12 NOTE — Progress Notes (Signed)
 Discontinue diphenhydramine from oncology treatment plan --> Add Quzyttir (cetirizine) 10 mg IVPush x 1 as premedication for oncology treatment plan.  T.O. Dr Carilyn Goodpasture, PharmD

## 2023-12-12 NOTE — Patient Instructions (Signed)
 [facility] - A DEPT OF [place]  Discharge Instructions: Thank you for choosing [facility] to provide your oncology and hematology care.  If you have a lab appointment with the Cancer Center - please note that after April 8th, 2024, all labs will be drawn in the cancer center.  You do not have to check in or register with the main entrance as you have in the past but will complete your check-in in the cancer center.  Wear comfortable clothing and clothing appropriate for easy access to any Portacath or PICC line.   We strive to give you quality time with your provider. You may need to reschedule your appointment if you arrive late (15 or more minutes).  Arriving late affects you and other patients whose appointments are after yours.  Also, if you miss three or more appointments without notifying the office, you may be dismissed from the clinic at the provider's discretion.      For prescription refill requests, have your pharmacy contact our office and allow 72 hours for refills to be completed.    Today you received the following chemotherapy and/or immunotherapy agents Opdivo /Taxol Rebekah [place].  Nivolumab  Injection What is this medication? NIVOLUMAB  (nye VOL ue mab) treats some types of cancer. It works by helping your immune system slow or stop the spread of cancer cells. It is a monoclonal antibody. This medicine may be used for other purposes; ask your health care provider or pharmacist if you have questions. COMMON BRAND NAME(S): Opdivo  What should I tell my care team before I take this medication? They need to know if you have any of these conditions: Allogeneic stem cell transplant (uses someone else's stem cells) Autoimmune diseases, such as Crohn disease, ulcerative colitis, lupus History of chest radiation Nervous system problems, such as Guillain-Barre syndrome or myasthenia gravis Organ transplant An unusual or allergic reaction to  nivolumab , other medications, foods, dyes, or preservatives Pregnant or trying to get pregnant Breast-feeding How should I use this medication? This medication is infused into a vein. It is given in a hospital or clinic setting. A special MedGuide will be given to you before each treatment. Be sure to read this information carefully each time. Talk to your care team about the use of this medication in children. While it may be prescribed for children as young as 12 years for selected conditions, precautions do apply. Overdosage: If you think you have taken too much of this medicine contact a poison control center or emergency room at once. NOTE: This medicine is only for you. Do not share this medicine with others. What if I miss a dose? Keep appointments for follow-up doses. It is important not to miss your dose. Call your care team if you are unable to keep an appointment. What may interact with this medication? Interactions have not been studied. This list may not describe all possible interactions. Give your health care provider a list of all the medicines, herbs, non-prescription drugs, or dietary supplements you use. Also tell them if you smoke, drink alcohol, or use illegal drugs. Some items may interact with your medicine. What should I watch for while using this medication? Your condition will be monitored carefully while you are receiving this medication. You may need blood work while taking this medication. This medication may cause serious skin reactions. They can happen weeks to months after starting the medication. Contact your care team right away if you notice fevers or flu-like  symptoms with a rash. The rash may be red or purple and then turn into blisters or peeling of the skin. You may also notice a red rash with swelling of the face, lips, or lymph nodes in your neck or under your arms. Tell your care team right away if you have any change in your eyesight. Talk to your care  team if you are pregnant or think you might be pregnant. A negative pregnancy test is required before starting this medication. A reliable form of contraception is recommended while taking this medication and for 5 months after the last dose. Talk to your care team about effective forms of contraception. Do not breast-feed while taking this medication and for 5 months after the last dose. What side effects may I notice from receiving this medication? Side effects that you should report to your care team as soon as possible: Allergic reactions--skin rash, itching, hives, swelling of the face, lips, tongue, or throat Dry cough, shortness of breath or trouble breathing Eye pain, redness, irritation, or discharge with blurry or decreased vision Heart muscle inflammation--unusual weakness or fatigue, shortness of breath, chest pain, fast or irregular heartbeat, dizziness, swelling of the ankles, feet, or hands Hormone gland problems--headache, sensitivity to light, unusual weakness or fatigue, dizziness, fast or irregular heartbeat, increased sensitivity to cold or heat, excessive sweating, constipation, hair loss, increased thirst or amount of urine, tremors or shaking, irritability Infusion reactions--chest pain, shortness of breath or trouble breathing, feeling faint or lightheaded Kidney injury (glomerulonephritis)--decrease in the amount of urine, red or dark Sanjith Siwek urine, foamy or bubbly urine, swelling of the ankles, hands, or feet Liver injury--right upper belly pain, loss of appetite, nausea, light-colored stool, dark yellow or Jenniger Figiel urine, yellowing skin or eyes, unusual weakness or fatigue Pain, tingling, or numbness in the hands or feet, muscle weakness, change in vision, confusion or trouble speaking, loss of balance or coordination, trouble walking, seizures Rash, fever, and swollen lymph nodes Redness, blistering, peeling, or loosening of the skin, including inside the mouth Sudden or severe  stomach pain, bloody diarrhea, fever, nausea, vomiting Side effects that usually do not require medical attention (report these to your care team if they continue or are bothersome): Bone, joint, or muscle pain Diarrhea Fatigue Loss of appetite Nausea Skin rash This list may not describe all possible side effects. Call your doctor for medical advice about side effects. You may report side effects to FDA at 1-800-FDA-1088. Where should I keep my medication? This medication is given in a hospital or clinic. It will not be stored at home. NOTE: This sheet is a summary. It may not cover all possible information. If you have questions about this medicine, talk to your doctor, pharmacist, or health care provider.  2024 Elsevier/Gold Standard (2021-11-29 00:00:00)   Paclitaxel  Injection What is this medication? PACLITAXEL  (PAK li TAX el) treats some types of cancer. It works by slowing down the growth of cancer cells. This medicine may be used for other purposes; ask your health care provider or pharmacist if you have questions. COMMON BRAND NAME(S): Onxol, Taxol  What should I tell my care team before I take this medication? They need to know if you have any of these conditions: Heart disease Liver disease Low white blood cell levels An unusual or allergic reaction to paclitaxel , other medications, foods, dyes, or preservatives If you or your partner are pregnant or trying to get pregnant Breast-feeding How should I use this medication? This medication is injected into a vein.  It is given by your care team in a hospital or clinic setting. Talk to your care team about the use of this medication in children. While it may be given to children for selected conditions, precautions do apply. Overdosage: If you think you have taken too much of this medicine contact a poison control center or emergency room at once. NOTE: This medicine is only for you. Do not share this medicine with others. What  if I miss a dose? Keep appointments for follow-up doses. It is important not to miss your dose. Call your care team if you are unable to keep an appointment. What may interact with this medication? Do not take this medication with any of the following: Live virus vaccines Other medications may affect the way this medication works. Talk with your care team about all of the medications you take. They may suggest changes to your treatment plan to lower the risk of side effects and to make sure your medications work as intended. This list may not describe all possible interactions. Give your health care provider a list of all the medicines, herbs, non-prescription drugs, or dietary supplements you use. Also tell them if you smoke, drink alcohol, or use illegal drugs. Some items may interact with your medicine. What should I watch for while using this medication? Your condition will be monitored carefully while you are receiving this medication. You may need blood work while taking this medication. This medication may make you feel generally unwell. This is not uncommon as chemotherapy can affect healthy cells as well as cancer cells. Report any side effects. Continue your course of treatment even though you feel ill unless your care team tells you to stop. This medication can cause serious allergic reactions. To reduce the risk, your care team may give you other medications to take before receiving this one. Be sure to follow the directions from your care team. This medication may increase your risk of getting an infection. Call your care team for advice if you get a fever, chills, sore throat, or other symptoms of a cold or flu. Do not treat yourself. Try to avoid being around people who are sick. This medication may increase your risk to bruise or bleed. Call your care team if you notice any unusual bleeding. Be careful brushing or flossing your teeth or using a toothpick because you may get an infection  or bleed more easily. If you have any dental work done, tell your dentist you are receiving this medication. Talk to your care team if you may be pregnant. Serious birth defects can occur if you take this medication during pregnancy. Talk to your care team before breastfeeding. Changes to your treatment plan may be needed. What side effects may I notice from receiving this medication? Side effects that you should report to your care team as soon as possible: Allergic reactions--skin rash, itching, hives, swelling of the face, lips, tongue, or throat Heart rhythm changes--fast or irregular heartbeat, dizziness, feeling faint or lightheaded, chest pain, trouble breathing Increase in blood pressure Infection--fever, chills, cough, sore throat, wounds that don't heal, pain or trouble when passing urine, general feeling of discomfort or being unwell Low blood pressure--dizziness, feeling faint or lightheaded, blurry vision Low red blood cell level--unusual weakness or fatigue, dizziness, headache, trouble breathing Painful swelling, warmth, or redness of the skin, blisters or sores at the infusion site Pain, tingling, or numbness in the hands or feet Slow heartbeat--dizziness, feeling faint or lightheaded, confusion, trouble breathing, unusual weakness  or fatigue Unusual bruising or bleeding Side effects that usually do not require medical attention (report to your care team if they continue or are bothersome): Diarrhea Hair loss Joint pain Loss of appetite Muscle pain Nausea Vomiting This list may not describe all possible side effects. Call your doctor for medical advice about side effects. You may report side effects to FDA at 1-800-FDA-1088. Where should I keep my medication? This medication is given in a hospital or clinic. It will not be stored at home. NOTE: This sheet is a summary. It may not cover all possible information. If you have questions about this medicine, talk to your doctor,  pharmacist, or health care provider.  2024 Elsevier/Gold Standard (2021-12-21 00:00:00)   Carboplatin  Injection What is this medication? CARBOPLATIN  (KAR boe pla tin) treats some types of cancer. It works by slowing down the growth of cancer cells. This medicine may be used for other purposes; ask your health care provider or pharmacist if you have questions. COMMON BRAND NAME(S): Paraplatin  What should I tell my care team before I take this medication? They need to know if you have any of these conditions: Blood disorders Hearing problems Kidney disease Recent or ongoing radiation therapy An unusual or allergic reaction to carboplatin , cisplatin, other medications, foods, dyes, or preservatives Pregnant or trying to get pregnant Breast-feeding How should I use this medication? This medication is injected into a vein. It is given by your care team in a hospital or clinic setting. Talk to your care team about the use of this medication in children. Special care may be needed. Overdosage: If you think you have taken too much of this medicine contact a poison control center or emergency room at once. NOTE: This medicine is only for you. Do not share this medicine with others. What if I miss a dose? Keep appointments for follow-up doses. It is important not to miss your dose. Call your care team if you are unable to keep an appointment. What may interact with this medication? Medications for seizures Some antibiotics, such as amikacin, gentamicin, neomycin, streptomycin, tobramycin Vaccines This list may not describe all possible interactions. Give your health care provider a list of all the medicines, herbs, non-prescription drugs, or dietary supplements you use. Also tell them if you smoke, drink alcohol, or use illegal drugs. Some items may interact with your medicine. What should I watch for while using this medication? Your condition will be monitored carefully while you are  receiving this medication. You may need blood work while taking this medication. This medication may make you feel generally unwell. This is not uncommon, as chemotherapy can affect healthy cells as well as cancer cells. Report any side effects. Continue your course of treatment even though you feel ill unless your care team tells you to stop. In some cases, you may be given additional medications to help with side effects. Follow all directions for their use. This medication may increase your risk of getting an infection. Call your care team for advice if you get a fever, chills, sore throat, or other symptoms of a cold or flu. Do not treat yourself. Try to avoid being around people who are sick. Avoid taking medications that contain aspirin, acetaminophen , ibuprofen, naproxen, or ketoprofen unless instructed by your care team. These medications may hide a fever. Be careful brushing or flossing your teeth or using a toothpick because you may get an infection or bleed more easily. If you have any dental work done, tell your dentist  you are receiving this medication. Talk to your care team if you wish to become pregnant or think you might be pregnant. This medication can cause serious birth defects. Talk to your care team about effective forms of contraception. Do not breast-feed while taking this medication. What side effects may I notice from receiving this medication? Side effects that you should report to your care team as soon as possible: Allergic reactions--skin rash, itching, hives, swelling of the face, lips, tongue, or throat Infection--fever, chills, cough, sore throat, wounds that don't heal, pain or trouble when passing urine, general feeling of discomfort or being unwell Low red blood cell level--unusual weakness or fatigue, dizziness, headache, trouble breathing Pain, tingling, or numbness in the hands or feet, muscle weakness, change in vision, confusion or trouble speaking, loss of  balance or coordination, trouble walking, seizures Unusual bruising or bleeding Side effects that usually do not require medical attention (report to your care team if they continue or are bothersome): Hair loss Nausea Unusual weakness or fatigue Vomiting This list may not describe all possible side effects. Call your doctor for medical advice about side effects. You may report side effects to FDA at 1-800-FDA-1088. Where should I keep my medication? This medication is given in a hospital or clinic. It will not be stored at home. NOTE: This sheet is a summary. It may not cover all possible information. If you have questions about this medicine, talk to your doctor, pharmacist, or health care provider.  2024 Elsevier/Gold Standard (2021-11-23 00:00:00)       To help prevent nausea and vomiting after your treatment, we encourage you to take your nausea medication as directed.  BELOW ARE SYMPTOMS THAT SHOULD BE REPORTED IMMEDIATELY: *FEVER GREATER THAN 100.4 F (38 C) OR HIGHER *CHILLS OR SWEATING *NAUSEA AND VOMITING THAT IS NOT CONTROLLED WITH YOUR NAUSEA MEDICATION *UNUSUAL SHORTNESS OF BREATH *UNUSUAL BRUISING OR BLEEDING *URINARY PROBLEMS (pain or burning when urinating, or frequent urination) *BOWEL PROBLEMS (unusual diarrhea, constipation, pain near the anus) TENDERNESS IN MOUTH AND THROAT WITH OR WITHOUT PRESENCE OF ULCERS (sore throat, sores in mouth, or a toothache) UNUSUAL RASH, SWELLING OR PAIN  UNUSUAL VAGINAL DISCHARGE OR ITCHING   Items with * indicate a potential emergency and should be followed up as soon as possible or go to the Emergency Department if any problems should occur.  Please show the CHEMOTHERAPY ALERT CARD or IMMUNOTHERAPY ALERT CARD at check-in to the Emergency Department and triage nurse.  Should you have questions after your visit or need to cancel or reschedule your appointment, please contact [facility] [place] - A DEPT OF [facility] [phone]  and follow the prompts.  Office hours are 8:00 a.m. to 4:30 p.m. Monday - Friday. Please note that voicemails left after 4:00 p.m. may not be returned until the following business day.  We are closed weekends and major holidays. You have access to a nurse at all times for urgent questions. Please call the main number to the clinic [phone] and follow the prompts.  For any non-urgent questions, you may also contact your provider using MyChart. We now offer e-Visits for anyone 89 and older to request care online for non-urgent symptoms. For details visit [email or web address].   Also download the MyChart app! Go to the app store, search "MyChart", open the app, select [place], and log in with your MyChart username and password.

## 2023-12-12 NOTE — Progress Notes (Signed)
 Patient tolerated chemotherapy with no complaints voiced.  Side effects with management reviewed with understanding verbalized.  Port site clean and dry with no bruising or swelling noted at site.  Good blood return noted before and after administration of chemotherapy.  Band aid applied.  Patient left in satisfactory condition with VSS and no s/s of distress noted. All follow ups as scheduled.   Venkat Ankney [employer]

## 2023-12-13 ENCOUNTER — Other Ambulatory Visit: Payer: Self-pay

## 2023-12-14 ENCOUNTER — Inpatient Hospital Stay: Admitting: Dietician

## 2023-12-14 ENCOUNTER — Inpatient Hospital Stay: Attending: Hematology

## 2023-12-14 VITALS — BP 127/80 | HR 104 | Temp 98.1°F | Resp 20

## 2023-12-14 DIAGNOSIS — E7800 Pure hypercholesterolemia, unspecified: Secondary | ICD-10-CM | POA: Diagnosis not present

## 2023-12-14 DIAGNOSIS — Z5111 Encounter for antineoplastic chemotherapy: Secondary | ICD-10-CM | POA: Diagnosis present

## 2023-12-14 DIAGNOSIS — F17210 Nicotine dependence, cigarettes, uncomplicated: Secondary | ICD-10-CM | POA: Insufficient documentation

## 2023-12-14 DIAGNOSIS — E876 Hypokalemia: Secondary | ICD-10-CM | POA: Diagnosis not present

## 2023-12-14 DIAGNOSIS — C3412 Malignant neoplasm of upper lobe, left bronchus or lung: Secondary | ICD-10-CM | POA: Diagnosis present

## 2023-12-14 DIAGNOSIS — I10 Essential (primary) hypertension: Secondary | ICD-10-CM | POA: Diagnosis not present

## 2023-12-14 DIAGNOSIS — F419 Anxiety disorder, unspecified: Secondary | ICD-10-CM | POA: Diagnosis not present

## 2023-12-14 DIAGNOSIS — Z79899 Other long term (current) drug therapy: Secondary | ICD-10-CM | POA: Diagnosis not present

## 2023-12-14 MED ORDER — PEGFILGRASTIM-CBQV 6 MG/0.6ML ~~LOC~~ SOSY
6.0000 mg | PREFILLED_SYRINGE | Freq: Once | SUBCUTANEOUS | Status: AC
Start: 2023-12-14 — End: 2023-12-14
  Administered 2023-12-14: 6 mg via SUBCUTANEOUS
  Filled 2023-12-14: qty 0.6

## 2023-12-14 NOTE — Progress Notes (Signed)
 Patient tolerated Udenyca injection with no complaints voiced.  Site clean and dry with no bruising or swelling noted.  No complaints of pain.  Discharged with vital signs stable and no signs or symptoms of distress noted.

## 2023-12-14 NOTE — Progress Notes (Signed)
 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 Patient is under the care of Dr. Cheree Cords.   Met with patient during injection. She reports doing well. Her appetite has been good and pleased weights have increased. Patient says nausea has resolved with start of treatment. She denies diarrhea, constipation.    Medications: reviewed   Labs: Albumin 3.1 (trending up from 2.7)  Anthropometrics: Wt 124 lb 14.4 oz today increased   4/8 - 117 lb 4.6 oz  3/25 - 113 lb 8.6 oz    NUTRITION DIAGNOSIS: Unintended wt loss - improved     INTERVENTION:  Continue strategies for increasing calories and protein with small frequent meals/snacks Pt has contact information     MONITORING, EVALUATION, GOAL: wt trends, intake   NEXT VISIT: To be scheduled as needed

## 2023-12-14 NOTE — Patient Instructions (Signed)
 [facility] [place] - A DEPT OF [facility]  Discharge Instructions: Thank you for choosing [facility] to provide your oncology and hematology care.  If you have a lab appointment with the Cancer Center - please note that after April 8th, 2024, all labs will be drawn in the cancer center.  You do not have to check in or register with the main entrance as you have in the past but will complete your check-in in the cancer center.  Wear comfortable clothing and clothing appropriate for easy access to any Portacath or PICC line.   We strive to give you quality time with your provider. You may need to reschedule your appointment if you arrive late (15 or more minutes).  Arriving late affects you and other patients whose appointments are after yours.  Also, if you miss three or more appointments without notifying the office, you may be dismissed from the clinic at the provider's discretion.      For prescription refill requests, have your pharmacy contact our office and allow 72 hours for refills to be completed.    Today you received the following chemotherapy and/or immunotherapy agents Udenyca.  Pegfilgrastim Injection What is this medication? PEGFILGRASTIM (PEG fil gra stim) lowers the risk of infection in people who are receiving chemotherapy. It works by Systems analyst make more white blood cells, which protects your body from infection. It may also be used to help people who have been exposed to high doses of radiation. This medicine may be used for other purposes; ask your health care provider or pharmacist if you have questions. COMMON BRAND NAME(S): Cherly Hensen, Neulasta, Nyvepria, Stimufend, UDENYCA, UDENYCA ONBODY, Ziextenzo What should I tell my care team before I take this medication? They need to know if you have any of these conditions: Kidney disease Latex allergy Ongoing radiation therapy Sickle cell disease Skin reactions to  acrylic adhesives (On-Body Injector only) An unusual or allergic reaction to pegfilgrastim, filgrastim, other medications, foods, dyes, or preservatives Pregnant or trying to get pregnant Breast-feeding How should I use this medication? This medication is for injection under the skin. If you get this medication at home, you will be taught how to prepare and give the pre-filled syringe or how to use the On-body Injector. Refer to the patient Instructions for Use for detailed instructions. Use exactly as directed. Tell your care team immediately if you suspect that the On-body Injector may not have performed as intended or if you suspect the use of the On-body Injector resulted in a missed or partial dose. It is important that you put your used needles and syringes in a special sharps container. Do not put them in a trash can. If you do not have a sharps container, call your pharmacist or care team to get one. Talk to your care team about the use of this medication in children. While this medication may be prescribed for selected conditions, precautions do apply. Overdosage: If you think you have taken too much of this medicine contact a poison control center or emergency room at once. NOTE: This medicine is only for you. Do not share this medicine with others. What if I miss a dose? It is important not to miss your dose. Call your care team if you miss your dose. If you miss a dose due to an On-body Injector failure or leakage, a new dose should be administered as soon as possible using a single prefilled syringe for  manual use. What may interact with this medication? Interactions have not been studied. This list may not describe all possible interactions. Give your health care provider a list of all the medicines, herbs, non-prescription drugs, or dietary supplements you use. Also tell them if you smoke, drink alcohol, or use illegal drugs. Some items may interact with your medicine. What should I  watch for while using this medication? Your condition will be monitored carefully while you are receiving this medication. You may need blood work done while you are taking this medication. Talk to your care team about your risk of cancer. You may be more at risk for certain types of cancer if you take this medication. If you are going to need a MRI, CT scan, or other procedure, tell your care team that you are using this medication (On-Body Injector only). What side effects may I notice from receiving this medication? Side effects that you should report to your care team as soon as possible: Allergic reactions--skin rash, itching, hives, swelling of the face, lips, tongue, or throat Capillary leak syndrome--stomach or muscle pain, unusual weakness or fatigue, feeling faint or lightheaded, decrease in the amount of urine, swelling of the ankles, hands, or feet, trouble breathing High white blood cell level--fever, fatigue, trouble breathing, night sweats, change in vision, weight loss Inflammation of the aorta--fever, fatigue, back, chest, or stomach pain, severe headache Kidney injury (glomerulonephritis)--decrease in the amount of urine, red or dark Adron Geisel urine, foamy or bubbly urine, swelling of the ankles, hands, or feet Shortness of breath or trouble breathing Spleen injury--pain in upper left stomach or shoulder Unusual bruising or bleeding Side effects that usually do not require medical attention (report to your care team if they continue or are bothersome): Bone pain Pain in the hands or feet This list may not describe all possible side effects. Call your doctor for medical advice about side effects. You may report side effects to FDA at 1-800-FDA-1088. Where should I keep my medication? Keep out of the reach of children. If you are using this medication at home, you will be instructed on how to store it. Throw away any unused medication after the expiration date on the label. NOTE:  This sheet is a summary. It may not cover all possible information. If you have questions about this medicine, talk to your doctor, pharmacist, or health care provider.  2024 Elsevier/Gold Standard (2021-07-02 00:00:00)       To help prevent nausea and vomiting after your treatment, we encourage you to take your nausea medication as directed.  BELOW ARE SYMPTOMS THAT SHOULD BE REPORTED IMMEDIATELY: *FEVER GREATER THAN 100.4 F (38 C) OR HIGHER *CHILLS OR SWEATING *NAUSEA AND VOMITING THAT IS NOT CONTROLLED WITH YOUR NAUSEA MEDICATION *UNUSUAL SHORTNESS OF BREATH *UNUSUAL BRUISING OR BLEEDING *URINARY PROBLEMS (pain or burning when urinating, or frequent urination) *BOWEL PROBLEMS (unusual diarrhea, constipation, pain near the anus) TENDERNESS IN MOUTH AND THROAT WITH OR WITHOUT PRESENCE OF ULCERS (sore throat, sores in mouth, or a toothache) UNUSUAL RASH, SWELLING OR PAIN  UNUSUAL VAGINAL DISCHARGE OR ITCHING   Items with * indicate a potential emergency and should be followed up as soon as possible or go to the Emergency Department if any problems should occur.  Please show the CHEMOTHERAPY ALERT CARD or IMMUNOTHERAPY ALERT CARD at check-in to the Emergency Department and triage nurse.  Should you have questions after your visit or need to cancel or reschedule your appointment, please contact [facility] [place] -  A DEPT OF [facility] [phone]  and follow the prompts.  Office hours are 8:00 a.m. to 4:30 p.m. Monday - Friday. Please note that voicemails left after 4:00 p.m. may not be returned until the following business day.  We are closed weekends and major holidays. You have access to a nurse at all times for urgent questions. Please call the main number to the clinic [phone] and follow the prompts.  For any non-urgent questions, you may also contact your provider using MyChart. We now offer e-Visits for anyone 58 and older to request care online for  non-urgent symptoms. For details visit [email or web address].   Also download the MyChart app! Go to the app store, search "MyChart", open the app, select [employer], and log in with your MyChart username and password.

## 2023-12-19 ENCOUNTER — Ambulatory Visit
Payer: 59 | Attending: Thoracic Surgery (Cardiothoracic Vascular Surgery) | Admitting: Thoracic Surgery (Cardiothoracic Vascular Surgery)

## 2023-12-19 VITALS — BP 114/77 | HR 100 | Resp 18 | Ht 61.0 in | Wt 124.0 lb

## 2023-12-19 DIAGNOSIS — C3412 Malignant neoplasm of upper lobe, left bronchus or lung: Secondary | ICD-10-CM | POA: Diagnosis not present

## 2023-12-19 NOTE — Progress Notes (Signed)
 [address]       Denise Rivas [id number]             [phone]      HPI: Denise Rivas returns for follow-up regarding her stage IIb/ IIIA lung cancer.  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  I saw her in February and we discussed neoadjuvant chemoimmunotherapy followed by surgical resection.  She has started that treatment and now returns for interval follow-up.  She is confused about how many cycles she has had and has remaining.  According to Dr. Milburn Aliment notes she has had 3 cycles and will have her fourth cycle in about 2 weeks.  She will then have follow-up scanning after the fourth cycle.  Says she feels pretty well.  She is having pain.  She is taking Percocet before she goes to bed at night.  Past Medical History:  Diagnosis Date   Anxiety    Hypercholesteremia    Hypertension    Lung cancer (HCC)    Neurofibromatosis, type 1 [facility])    saw Dermatology [facility] 2023    Current Outpatient Medications  Medication Sig Dispense Refill   amLODipine (NORVASC) 5 MG tablet Take 5 mg by mouth in the morning.     aspirin EC 81 MG tablet Take 81 mg by mouth in the morning.     dexamethasone  (DECADRON ) 2 MG tablet Take 1 tablet (2 mg total) by mouth daily with breakfast. 7 tablet 0   fluticasone (FLONASE) 50 MCG/ACT nasal spray Place 1 spray into both nostrils daily as needed for allergies.     Fosaprepitant  Dimeglumine (EMEND IV) Inject into the vein.     gabapentin  (NEURONTIN ) 300 MG capsule Take 1 capsule (300 mg total) by mouth See admin instructions. Take 1 capsule (300 mg) by mouth scheduled at bedtime, may take up to 2 additional dose if needed for pain. 90 capsule 11   hydrOXYzine (ATARAX) 25 MG tablet Take 25 mg by mouth daily as needed for anxiety.      ibuprofen (ADVIL) 200 MG tablet Take 400 mg by mouth every 8 (eight) hours as needed (pain).     Lactulose  20 GM/30ML SOLN Take 30 mLs (20 g total) by mouth daily as needed. Take 30 ml by mouth every 3 hours until you have a bowel movement, then use daily as needed for constipation 450 mL 1   lidocaine -prilocaine  (EMLA ) cream Apply 1 Application topically as needed. 30 g 0   megestrol  (MEGACE ) 400 MG/10ML suspension Take 10 mLs (400 mg total) by mouth 2 (two) times daily. 480 mL 2   mirtazapine (REMERON SOL-TAB) 45 MG disintegrating tablet Take 45 mg by mouth at bedtime.     naproxen sodium (ALEVE) 220 MG tablet Take 220-440 mg by mouth 2 (two) times daily as needed (pain.).     ondansetron  (ZOFRAN ) 4 MG tablet Take 1 tablet (4 mg total) by mouth every 8 (eight) hours as needed. 30 tablet 1   oxyCODONE  (ROXICODONE ) 5 MG immediate release tablet Take 1 tablet (5 mg total) by mouth every 4 (four) hours as needed for severe pain (pain score 7-10) or breakthrough pain. 4 tablet 0   oxyCODONE -acetaminophen  (PERCOCET) 10-325 MG tablet Take 1 tablet by mouth every 12 (twelve) hours as needed for pain. 60 tablet 0  Palonosetron  HCl (ALOXI  IV) Inject into the vein.     potassium chloride  SA (KLOR-CON  M) 20 MEQ tablet Take 1 tablet (20 mEq total) by mouth daily. 30 tablet 5   pravastatin (PRAVACHOL) 40 MG tablet Take 40 mg by mouth daily.     prochlorperazine  (COMPAZINE ) 10 MG tablet Take 1 tablet (10 mg total) by mouth every 6 (six) hours as needed for nausea or vomiting. 60 tablet 2   prochlorperazine  (COMPAZINE ) 10 MG tablet Take 1 tablet (10 mg total) by mouth every 6 (six) hours as needed for nausea or vomiting. 30 tablet 0   sertraline (ZOLOFT) 100 MG tablet Take 100 mg by mouth in the morning.     No current facility-administered medications for this visit.    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    Stage IIb squamous cell carcinoma of the lung with chest wall invasion-has had 3 of 4 cycles of neoadjuvant chemoimmunotherapy.  To have reimaging after fourth cycle.  Today she did not really recall any of our previous conversation regarding surgery.  She is not sure if she wants to have surgery done.  After some discussion she is willing to return to discuss possible resection depending on the results of her follow-up scans.  Parotid lesion -Unclear if she has seen ENT yet.  Will check with Dr. Katragadda.  Plan: Return in 4 to 6 weeks after repeat scanning by Dr. Katragadda  Kendall Arnell C Denise Bacorn, MD [facility] [phone]

## 2023-12-20 ENCOUNTER — Other Ambulatory Visit: Payer: Self-pay

## 2024-01-01 ENCOUNTER — Other Ambulatory Visit: Payer: Self-pay | Admitting: Hematology

## 2024-01-01 NOTE — Progress Notes (Signed)
 [facility] [address] [place] [address]    Clinic Day:  01/02/2024  Referring physician: Joanne Muckle, FNP  Patient Care Team: Joanne Muckle, FNP as PCP - General (Family Medicine) Diamond Formica, MD as Consulting Physician (Pulmonary Disease)   ASSESSMENT & PLAN:   Assessment: 1.  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  Mass abuts anterior pleural surface.  There is mild cortical erosion along the inner aspect of the left anterior third rib abutting the left upper lobe mass consistent with direct invasion.  Indeterminate 5 mm subsolid LUL nodule too small to characterize. - Denies any fevers, night sweats or weight loss.  No hemoptysis. - She reports burning all over the body.  Also reports left chest wall pain, aching type preventing her to sleep on the left side.  She is taking Tylenol  as needed which helps. - Guardant360: PIK3CA [id number], PTEN deletion (exon 1), T p53, BLM - PET scan (09/01/2023): 7 cm anterior left upper lobe mass invading left chest wall with destruction of the left third anterior rib.  No adenopathy or distant metastatic disease.  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 - MRI brain (08/31/2022): No evidence of metastatic disease. - Left upper lobe biopsy (09/11/2023): Invasive moderately differentiated squamous cell carcinoma. - 4 cycles of neoadjuvant carboplatin , paclitaxel  and nivolumab  from 10/31/2023 through 01/02/2024   2. Social/Family History: -Lives at home alone and does not work. Tobacco use of 1 cigarette a day since her 20's. No chemical exposures.  -Mother had uterine cancer. 2 sisters had breast cancer.     Plan: 1.  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 - Labs today: Normal LFTs and creatinine.  CBC grossly normal. - She may proceed with cycle 4 today without any dose modifications.  I will obtain CT chest with contrast to evaluate response prior to next visit in 3 weeks.  If she has good response, I will refer her to Dr. Luna Salinas.   2.  Left chest wall pain: - Pain is well-controlled on Percocet 10/325 twice daily as needed.   3.  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  4.  Decreased appetite: - Continue Megace  twice daily.  She is eating well.  She is also drinking 1 can of Ensure per day.  She has gained about 5 pounds since last visit.  5.  Hypokalemia: - Continue K-Dur 20 mEq daily.  Potassium is normal.    Orders Placed This Encounter  Procedures   CT CHEST W CONTRAST    Standing Status:   Future    Expected Date:   01/16/2024    Expiration Date:   01/01/2025    If indicated for the ordered procedure, I authorize the administration of contrast media per Radiology protocol:   Yes    Does the patient have a contrast media/X-ray dye allergy?:   No    Preferred imaging location?:   [facility] R Teague,acting as a scribe for Paulett Boros, MD.,have documented all relevant documentation on the behalf  of Catia Todorov, MD,as directed by  Paulett Boros, MD while in the presence of Paulett Boros, MD.  I, Paulett Boros MD, have reviewed the above documentation for accuracy and completeness, and I agree with the above.     Paulett Boros, MD   5/20/20259:39 AM  CHIEF COMPLAINT:   Diagnosis: LUL squamous cell lung cancer   Cancer Staging  Primary squamous cell carcinoma of upper lobe of left lung [facility]) Staging form: Lung, AJCC V9 - Clinical stage from 09/20/2023: Stage IIB (cT3, cN0, cM0) - Unsigned    Prior Therapy: none  Current Therapy:  Neoadjuvant chemoimmunotherapy   HISTORY OF PRESENT ILLNESS:   Oncology History  Primary squamous cell carcinoma of upper lobe of left lung (HCC)  09/20/2023 Initial Diagnosis   Primary squamous cell carcinoma of upper lobe of left lung (HCC)   10/31/2023 -  Chemotherapy   Patient is on Treatment Plan : LUNG Opdivo  + Carboplatin  + Paclitaxel  q21d        INTERVAL HISTORY:   Denise Rivas is a 63 y.o. female presenting to clinic today for follow up of LUL squamous cell lung cancer. She was last seen by me on 12/12/23.  Today, she states that she is doing well overall. Her appetite level is at 100%. Her energy level is at 100%.    PAST MEDICAL HISTORY:   Past Medical History: Past Medical History:  Diagnosis Date   Anxiety    Hypercholesteremia    Hypertension    Lung cancer (HCC)    Neurofibromatosis, type 1 [facility])    saw Dermatology [facility] 2023    Surgical History: Past Surgical History:  Procedure Laterality Date   ABDOMINAL HYSTERECTOMY     PORTACATH PLACEMENT Right 10/12/2023   Procedure: INSERTION PORT-A-CATH;  Surgeon: Awilda Bogus, MD;  Location: [facility];  Service: General;  Laterality: Right;    Social History: Social History   Socioeconomic History   Marital status: Single    Spouse name: Not on file   Number of children: Not on file   Years of education: Not on file   Highest education level: Not on file  Occupational History   Not on file  Tobacco Use   Smoking status: Some Days    Types: Cigarettes   Smokeless tobacco: Never  Vaping Use   Vaping status: Never Used  Substance and Sexual Activity   Alcohol use: Not Currently   Drug use: Never   Sexual activity: Not on file  Other Topics Concern   Not on file  Social History Narrative   Not on file   Social Drivers of Health   Financial Resource Strain: Not on file  Food Insecurity: Not on file  Transportation Needs: Not on file  Physical Activity: Not on file  Stress: Not on file  Social  Connections: Unknown (06/14/2022)   Received from [facility], [facility]   Social Network    Social Network: Not on file  Intimate Partner Violence: Unknown (06/14/2022)   Received from [employer], [facility]   HITS    Physically Hurt: Not on file    Insult or Talk Down To: Not on file    Threaten Physical Harm: Not on file    Scream or Curse: Not on file    Family History: No family history on file.  Current Medications:  Current Outpatient Medications:    amLODipine (NORVASC) 5 MG tablet, Take 5 mg by mouth in the morning., Disp: , Rfl:  aspirin EC 81 MG tablet, Take 81 mg by mouth in the morning., Disp: , Rfl:    dexamethasone  (DECADRON ) 2 MG tablet, Take 1 tablet (2 mg total) by mouth daily with breakfast., Disp: 7 tablet, Rfl: 0   fluticasone (FLONASE) 50 MCG/ACT nasal spray, Place 1 spray into both nostrils daily as needed for allergies., Disp: , Rfl:    Fosaprepitant  Dimeglumine (EMEND IV), Inject into the vein., Disp: , Rfl:    gabapentin  (NEURONTIN ) 300 MG capsule, Take 1 capsule (300 mg total) by mouth See admin instructions. Take 1 capsule (300 mg) by mouth scheduled at bedtime, may take up to 2 additional dose if needed for pain., Disp: 90 capsule, Rfl: 11   hydrOXYzine (ATARAX) 25 MG tablet, Take 25 mg by mouth daily as needed for anxiety., Disp: , Rfl:    ibuprofen (ADVIL) 200 MG tablet, Take 400 mg by mouth every 8 (eight) hours as needed (pain)., Disp: , Rfl:    Lactulose  20 GM/30ML SOLN, Take 30 mLs (20 g total) by mouth daily as needed. Take 30 ml by mouth every 3 hours until you have a bowel movement, then use daily as needed for constipation, Disp: 450 mL, Rfl: 1   lidocaine -prilocaine  (EMLA ) cream, Apply 1 Application topically as needed., Disp: 30 g, Rfl: 0   megestrol  (MEGACE ) 40 MG/ML suspension, Take 10 mLs (400 mg total) by mouth 2 (two) times daily., Disp: 480 mL, Rfl: 2   mirtazapine (REMERON SOL-TAB) 45 MG disintegrating tablet, Take 45 mg by  mouth at bedtime., Disp: , Rfl:    naproxen sodium (ALEVE) 220 MG tablet, Take 220-440 mg by mouth 2 (two) times daily as needed (pain.)., Disp: , Rfl:    ondansetron  (ZOFRAN ) 4 MG tablet, Take 1 tablet (4 mg total) by mouth every 8 (eight) hours as needed., Disp: 30 tablet, Rfl: 1   oxyCODONE -acetaminophen  (PERCOCET) 10-325 MG tablet, Take 1 tablet by mouth every 12 (twelve) hours as needed for pain., Disp: 60 tablet, Rfl: 0   Palonosetron  HCl (ALOXI  IV), Inject into the vein., Disp: , Rfl:    potassium chloride  SA (KLOR-CON  M) 20 MEQ tablet, Take 1 tablet (20 mEq total) by mouth daily., Disp: 30 tablet, Rfl: 5   pravastatin (PRAVACHOL) 40 MG tablet, Take 40 mg by mouth daily., Disp: , Rfl:    prochlorperazine  (COMPAZINE ) 10 MG tablet, Take 1 tablet (10 mg total) by mouth every 6 (six) hours as needed for nausea or vomiting., Disp: 60 tablet, Rfl: 2   sertraline (ZOLOFT) 100 MG tablet, Take 100 mg by mouth in the morning., Disp: , Rfl:    Allergies: No Known Allergies  REVIEW OF SYSTEMS:   Review of Systems  Constitutional:  Negative for chills, fatigue and fever.  HENT:   Negative for lump/mass, mouth sores, nosebleeds, sore throat and trouble swallowing.   Eyes:  Negative for eye problems.  Respiratory:  Negative for cough and shortness of breath.   Cardiovascular:  Negative for chest pain, leg swelling and palpitations.  Gastrointestinal:  Negative for abdominal pain, constipation, diarrhea, nausea and vomiting.  Genitourinary:  Negative for bladder incontinence, difficulty urinating, dysuria, frequency, hematuria and nocturia.   Musculoskeletal:  Negative for arthralgias, back pain, flank pain, myalgias and neck pain.  Skin:  Negative for itching and rash.  Neurological:  Negative for dizziness, headaches and numbness.  Hematological:  Does not bruise/bleed easily.  Psychiatric/Behavioral:  Negative for depression, sleep disturbance and suicidal ideas. The patient is not  nervous/anxious.  All other systems reviewed and are negative.    VITALS:   There were no vitals taken for this visit.  Wt Readings from Last 3 Encounters:  01/02/24 129 lb 10.1 oz (58.8 kg)  12/19/23 124 lb (56.2 kg)  12/12/23 124 lb 14.4 oz (56.7 kg)    There is no height or weight on file to calculate BMI.  Performance status (ECOG): 1 - Symptomatic but completely ambulatory  PHYSICAL EXAM:   Physical Exam Vitals and nursing note reviewed. Exam conducted with a chaperone present.  Constitutional:      Appearance: Normal appearance.  Cardiovascular:     Rate and Rhythm: Normal rate and regular rhythm.     Pulses: Normal pulses.     Heart sounds: Normal heart sounds.  Pulmonary:     Effort: Pulmonary effort is normal.     Breath sounds: Normal breath sounds.  Abdominal:     Palpations: Abdomen is soft. There is no hepatomegaly, splenomegaly or mass.     Tenderness: There is no abdominal tenderness.  Musculoskeletal:     Right lower leg: No edema.     Left lower leg: No edema.  Lymphadenopathy:     Cervical: No cervical adenopathy.     Right cervical: No superficial, deep or posterior cervical adenopathy.    Left cervical: No superficial, deep or posterior cervical adenopathy.     Upper Body:     Right upper body: No supraclavicular or axillary adenopathy.     Left upper body: No supraclavicular or axillary adenopathy.  Neurological:     General: No focal deficit present.     Mental Status: She is alert and oriented to person, place, and time.  Psychiatric:        Mood and Affect: Mood normal.        Behavior: Behavior normal.     LABS:   CBC     Component Value Date/Time   WBC 11.4 (H) 01/02/2024 0824   RBC 3.41 (L) 01/02/2024 0824   HGB 11.4 (L) 01/02/2024 0824   HCT 34.5 (L) 01/02/2024 0824   PLT 343 01/02/2024 0824   MCV 101.2 (H) 01/02/2024 0824   MCH 33.4 01/02/2024 0824   MCHC 33.0 01/02/2024 0824   RDW 22.7 (H) 01/02/2024 0824   LYMPHSABS 1.9  01/02/2024 0824   MONOABS 1.6 (H) 01/02/2024 0824   EOSABS 0.0 01/02/2024 0824   BASOSABS 0.1 01/02/2024 0824    CMP      Component Value Date/Time   NA 134 (L) 01/02/2024 0824   K 3.5 01/02/2024 0824   CL 106 01/02/2024 0824   CO2 19 (L) 01/02/2024 0824   GLUCOSE 89 01/02/2024 0824   BUN 14 01/02/2024 0824   CREATININE 0.48 01/02/2024 0824   CALCIUM 9.0 01/02/2024 0824   PROT 7.2 01/02/2024 0824   ALBUMIN 3.3 (L) 01/02/2024 0824   AST 15 01/02/2024 0824   ALT 17 01/02/2024 0824   ALKPHOS 71 01/02/2024 0824   BILITOT 0.4 01/02/2024 0824   GFRNONAA >60 01/02/2024 0824   GFRAA >60 02/18/2020 0932     No results found for: "CEA1", "CEA" / No results found for: "CEA1", "CEA" No results found for: "PSA1" No results found for: "ZOX096" No results found for: "CAN125"  No results found for: "TOTALPROTELP", "ALBUMINELP", "A1GS", "A2GS", "BETS", "BETA2SER", "GAMS", "MSPIKE", "SPEI" No results found for: "TIBC", "FERRITIN", "IRONPCTSAT" No results found for: "LDH"   STUDIES:   No results found.

## 2024-01-02 ENCOUNTER — Inpatient Hospital Stay (HOSPITAL_BASED_OUTPATIENT_CLINIC_OR_DEPARTMENT_OTHER): Admitting: Hematology

## 2024-01-02 ENCOUNTER — Inpatient Hospital Stay

## 2024-01-02 VITALS — BP 136/86 | HR 105 | Resp 19

## 2024-01-02 DIAGNOSIS — C3412 Malignant neoplasm of upper lobe, left bronchus or lung: Secondary | ICD-10-CM

## 2024-01-02 DIAGNOSIS — Z5111 Encounter for antineoplastic chemotherapy: Secondary | ICD-10-CM | POA: Diagnosis not present

## 2024-01-02 LAB — COMPREHENSIVE METABOLIC PANEL WITH GFR
ALT: 17 U/L (ref 0–44)
AST: 15 U/L (ref 15–41)
Albumin: 3.3 g/dL — ABNORMAL LOW (ref 3.5–5.0)
Alkaline Phosphatase: 71 U/L (ref 38–126)
Anion gap: 9 (ref 5–15)
BUN: 14 mg/dL (ref 8–23)
CO2: 19 mmol/L — ABNORMAL LOW (ref 22–32)
Calcium: 9 mg/dL (ref 8.9–10.3)
Chloride: 106 mmol/L (ref 98–111)
Creatinine, Ser: 0.48 mg/dL (ref 0.44–1.00)
GFR, Estimated: >60 mL/min (ref >60)
Glucose, Bld: 89 mg/dL (ref 70–99)
Potassium: 3.5 mmol/L (ref 3.5–5.1)
Sodium: 134 mmol/L — ABNORMAL LOW (ref 135–145)
Total Bilirubin: 0.4 mg/dL (ref 0.0–1.2)
Total Protein: 7.2 g/dL (ref 6.5–8.1)

## 2024-01-02 LAB — CBC WITH DIFFERENTIAL/PLATELET
Abs Immature Granulocytes: 0.12 10*3/uL — ABNORMAL HIGH (ref 0.00–0.07)
Basophils Absolute: 0.1 10*3/uL (ref 0.0–0.1)
Basophils Relative: 1 %
Eosinophils Absolute: 0 10*3/uL (ref 0.0–0.5)
Eosinophils Relative: 0 %
HCT: 34.5 % — ABNORMAL LOW (ref 36.0–46.0)
Hemoglobin: 11.4 g/dL — ABNORMAL LOW (ref 12.0–15.0)
Immature Granulocytes: 1 %
Lymphocytes Relative: 17 %
Lymphs Abs: 1.9 10*3/uL (ref 0.7–4.0)
MCH: 33.4 pg (ref 26.0–34.0)
MCHC: 33 g/dL (ref 30.0–36.0)
MCV: 101.2 fL — ABNORMAL HIGH (ref 80.0–100.0)
Monocytes Absolute: 1.6 10*3/uL — ABNORMAL HIGH (ref 0.1–1.0)
Monocytes Relative: 14 %
Neutro Abs: 7.6 10*3/uL (ref 1.7–7.7)
Neutrophils Relative %: 67 %
Platelets: 343 10*3/uL (ref 150–400)
RBC: 3.41 MIL/uL — ABNORMAL LOW (ref 3.87–5.11)
RDW: 22.7 % — ABNORMAL HIGH (ref 11.5–15.5)
WBC: 11.4 10*3/uL — ABNORMAL HIGH (ref 4.0–10.5)
nRBC: 0 % (ref 0.0–0.2)

## 2024-01-02 LAB — MAGNESIUM: Magnesium: 1.8 mg/dL (ref 1.7–2.4)

## 2024-01-02 MED ORDER — SODIUM CHLORIDE 0.9 % IV SOLN
150.0000 mg | Freq: Once | INTRAVENOUS | Status: AC
  Administered 2024-01-02: 150 mg via INTRAVENOUS
  Filled 2024-01-02: qty 150

## 2024-01-02 MED ORDER — SODIUM CHLORIDE 0.9% FLUSH
10.0000 mL | Freq: Once | INTRAVENOUS | Status: AC
Start: 2024-01-02 — End: 2024-01-02
  Administered 2024-01-02: 10 mL via INTRAVENOUS

## 2024-01-02 MED ORDER — SODIUM CHLORIDE 0.9% FLUSH
10.0000 mL | INTRAVENOUS | Status: DC | PRN
  Administered 2024-01-02: 10 mL

## 2024-01-02 MED ORDER — SODIUM CHLORIDE 0.9 % IV SOLN
INTRAVENOUS | Status: DC
Start: 2024-01-02 — End: 2024-01-02

## 2024-01-02 MED ORDER — PALONOSETRON HCL INJECTION 0.25 MG/5ML
0.2500 mg | Freq: Once | INTRAVENOUS | Status: AC
  Administered 2024-01-02: 0.25 mg via INTRAVENOUS
  Filled 2024-01-02: qty 5

## 2024-01-02 MED ORDER — SODIUM CHLORIDE 0.9 % IV SOLN
518.4000 mg | Freq: Once | INTRAVENOUS | Status: AC
  Administered 2024-01-02: 520 mg via INTRAVENOUS
  Filled 2024-01-02: qty 52

## 2024-01-02 MED ORDER — CETIRIZINE HCL 10 MG/ML IV SOLN
10.0000 mg | Freq: Once | INTRAVENOUS | Status: AC
  Administered 2024-01-02: 10 mg via INTRAVENOUS
  Filled 2024-01-02: qty 1

## 2024-01-02 MED ORDER — FAMOTIDINE IN NACL 20-0.9 MG/50ML-% IV SOLN
20.0000 mg | Freq: Once | INTRAVENOUS | Status: AC
  Administered 2024-01-02: 20 mg via INTRAVENOUS
  Filled 2024-01-02: qty 50

## 2024-01-02 MED ORDER — SODIUM CHLORIDE 0.9 % IV SOLN
175.0000 mg/m2 | Freq: Once | INTRAVENOUS | Status: AC
  Administered 2024-01-02: 270 mg via INTRAVENOUS
  Filled 2024-01-02: qty 45

## 2024-01-02 MED ORDER — DEXAMETHASONE SODIUM PHOSPHATE 10 MG/ML IJ SOLN
10.0000 mg | Freq: Once | INTRAMUSCULAR | Status: AC
  Administered 2024-01-02: 10 mg via INTRAVENOUS
  Filled 2024-01-02: qty 1

## 2024-01-02 MED ORDER — SODIUM CHLORIDE 0.9 % IV SOLN
360.0000 mg | Freq: Once | INTRAVENOUS | Status: AC
  Administered 2024-01-02: 360 mg via INTRAVENOUS
  Filled 2024-01-02: qty 24

## 2024-01-02 MED ORDER — HEPARIN SOD (PORK) LOCK FLUSH 100 UNIT/ML IV SOLN
500.0000 [IU] | Freq: Once | INTRAVENOUS | Status: AC | PRN
  Administered 2024-01-02: 500 [IU]

## 2024-01-02 NOTE — Progress Notes (Signed)
Patient presents today for chemotherapy infusion. Patient is in satisfactory condition with no new complaints voiced.  Vital signs are stable.  Labs reviewed by Dr. Katragadda during the office visit and all labs are within treatment parameters.  We will proceed with treatment per MD orders.   Patient tolerated treatment well with no complaints voiced.  Patient left ambulatory in stable condition.  Vital signs stable at discharge.  Follow up as scheduled.       

## 2024-01-02 NOTE — Patient Instructions (Signed)
 [facility] - A DEPT OF [facility]  Discharge Instructions: Thank you for choosing [facility] to provide your oncology and hematology care.  If you have a lab appointment with the Cancer Center - please note that after April 8th, 2024, all labs will be drawn in the cancer center.  You do not have to check in or register with the main entrance as you have in the past but will complete your check-in in the cancer center.  Wear comfortable clothing and clothing appropriate for easy access to any Portacath or PICC line.   We strive to give you quality time with your provider. You may need to reschedule your appointment if you arrive late (15 or more minutes).  Arriving late affects you and other patients whose appointments are after yours.  Also, if you miss three or more appointments without notifying the office, you may be dismissed from the clinic at the provider's discretion.      For prescription refill requests, have your pharmacy contact our office and allow 72 hours for refills to be completed.    Today you received the following chemotherapy and/or immunotherapy agents Opdivo /Taxol /Carboplatin .  Nivolumab  Injection What is this medication? NIVOLUMAB  (nye VOL ue mab) treats some types of cancer. It works by helping your immune system slow or stop the spread of cancer cells. It is a monoclonal antibody. This medicine may be used for other purposes; ask your health care provider or pharmacist if you have questions. COMMON BRAND NAME(S): Opdivo  What should I tell my care team before I take this medication? They need to know if you have any of these conditions: Allogeneic stem cell transplant (uses someone else's stem cells) Autoimmune diseases, such as Crohn disease, ulcerative colitis, lupus History of chest radiation Nervous system problems, such as Guillain-Barre syndrome or myasthenia gravis Organ transplant An unusual or allergic reaction  to nivolumab , other medications, foods, dyes, or preservatives Pregnant or trying to get pregnant Breast-feeding How should I use this medication? This medication is infused into a vein. It is given in a hospital or clinic setting. A special MedGuide will be given to you before each treatment. Be sure to read this information carefully each time. Talk to your care team about the use of this medication in children. While it may be prescribed for children as young as 12 years for selected conditions, precautions do apply. Overdosage: If you think you have taken too much of this medicine contact a poison control center or emergency room at once. NOTE: This medicine is only for you. Do not share this medicine with others. What if I miss a dose? Keep appointments for follow-up doses. It is important not to miss your dose. Call your care team if you are unable to keep an appointment. What may interact with this medication? Interactions have not been studied. This list may not describe all possible interactions. Give your health care provider a list of all the medicines, herbs, non-prescription drugs, or dietary supplements you use. Also tell them if you smoke, drink alcohol, or use illegal drugs. Some items may interact with your medicine. What should I watch for while using this medication? Your condition will be monitored carefully while you are receiving this medication. You may need blood work while taking this medication. This medication may cause serious skin reactions. They can happen weeks to months after starting the medication. Contact your care team right away if you notice fevers or flu-like  symptoms with a rash. The rash may be red or purple and then turn into blisters or peeling of the skin. You may also notice a red rash with swelling of the face, lips, or lymph nodes in your neck or under your arms. Tell your care team right away if you have any change in your eyesight. Talk to your care  team if you are pregnant or think you might be pregnant. A negative pregnancy test is required before starting this medication. A reliable form of contraception is recommended while taking this medication and for 5 months after the last dose. Talk to your care team about effective forms of contraception. Do not breast-feed while taking this medication and for 5 months after the last dose. What side effects may I notice from receiving this medication? Side effects that you should report to your care team as soon as possible: Allergic reactions--skin rash, itching, hives, swelling of the face, lips, tongue, or throat Dry cough, shortness of breath or trouble breathing Eye pain, redness, irritation, or discharge with blurry or decreased vision Heart muscle inflammation--unusual weakness or fatigue, shortness of breath, chest pain, fast or irregular heartbeat, dizziness, swelling of the ankles, feet, or hands Hormone gland problems--headache, sensitivity to light, unusual weakness or fatigue, dizziness, fast or irregular heartbeat, increased sensitivity to cold or heat, excessive sweating, constipation, hair loss, increased thirst or amount of urine, tremors or shaking, irritability Infusion reactions--chest pain, shortness of breath or trouble breathing, feeling faint or lightheaded Kidney injury (glomerulonephritis)--decrease in the amount of urine, red or dark Albeiro Trompeter urine, foamy or bubbly urine, swelling of the ankles, hands, or feet Liver injury--right upper belly pain, loss of appetite, nausea, light-colored stool, dark yellow or Jlee Harkless urine, yellowing skin or eyes, unusual weakness or fatigue Pain, tingling, or numbness in the hands or feet, muscle weakness, change in vision, confusion or trouble speaking, loss of balance or coordination, trouble walking, seizures Rash, fever, and swollen lymph nodes Redness, blistering, peeling, or loosening of the skin, including inside the mouth Sudden or severe  stomach pain, bloody diarrhea, fever, nausea, vomiting Side effects that usually do not require medical attention (report these to your care team if they continue or are bothersome): Bone, joint, or muscle pain Diarrhea Fatigue Loss of appetite Nausea Skin rash This list may not describe all possible side effects. Call your doctor for medical advice about side effects. You may report side effects to FDA at 1-800-FDA-1088. Where should I keep my medication? This medication is given in a hospital or clinic. It will not be stored at home. NOTE: This sheet is a summary. It may not cover all possible information. If you have questions about this medicine, talk to your doctor, pharmacist, or health care provider.  2024 Elsevier/Gold Standard (2021-11-29 00:00:00)   Paclitaxel  Injection What is this medication? PACLITAXEL  (PAK li TAX el) treats some types of cancer. It works by slowing down the growth of cancer cells. This medicine may be used for other purposes; ask your health care provider or pharmacist if you have questions. COMMON BRAND NAME(S): Onxol, Taxol  What should I tell my care team before I take this medication? They need to know if you have any of these conditions: Heart disease Liver disease Low white blood cell levels An unusual or allergic reaction to paclitaxel , other medications, foods, dyes, or preservatives If you or your partner are pregnant or trying to get pregnant Breast-feeding How should I use this medication? This medication is injected into a vein.  It is given by your care team in a hospital or clinic setting. Talk to your care team about the use of this medication in children. While it may be given to children for selected conditions, precautions do apply. Overdosage: If you think you have taken too much of this medicine contact a poison control center or emergency room at once. NOTE: This medicine is only for you. Do not share this medicine with others. What  if I miss a dose? Keep appointments for follow-up doses. It is important not to miss your dose. Call your care team if you are unable to keep an appointment. What may interact with this medication? Do not take this medication with any of the following: Live virus vaccines Other medications may affect the way this medication works. Talk with your care team about all of the medications you take. They may suggest changes to your treatment plan to lower the risk of side effects and to make sure your medications work as intended. This list may not describe all possible interactions. Give your health care provider a list of all the medicines, herbs, non-prescription drugs, or dietary supplements you use. Also tell them if you smoke, drink alcohol, or use illegal drugs. Some items may interact with your medicine. What should I watch for while using this medication? Your condition will be monitored carefully while you are receiving this medication. You may need blood work while taking this medication. This medication may make you feel generally unwell. This is not uncommon as chemotherapy can affect healthy cells as well as cancer cells. Report any side effects. Continue your course of treatment even though you feel ill unless your care team tells you to stop. This medication can cause serious allergic reactions. To reduce the risk, your care team may give you other medications to take before receiving this one. Be sure to follow the directions from your care team. This medication may increase your risk of getting an infection. Call your care team for advice if you get a fever, chills, sore throat, or other symptoms of a cold or flu. Do not treat yourself. Try to avoid being around people who are sick. This medication may increase your risk to bruise or bleed. Call your care team if you notice any unusual bleeding. Be careful brushing or flossing your teeth or using a toothpick because you may get an infection  or bleed more easily. If you have any dental work done, tell your dentist you are receiving this medication. Talk to your care team if you may be pregnant. Serious birth defects can occur if you take this medication during pregnancy. Talk to your care team before breastfeeding. Changes to your treatment plan may be needed. What side effects may I notice from receiving this medication? Side effects that you should report to your care team as soon as possible: Allergic reactions--skin rash, itching, hives, swelling of the face, lips, tongue, or throat Heart rhythm changes--fast or irregular heartbeat, dizziness, feeling faint or lightheaded, chest pain, trouble breathing Increase in blood pressure Infection--fever, chills, cough, sore throat, wounds that don't heal, pain or trouble when passing urine, general feeling of discomfort or being unwell Low blood pressure--dizziness, feeling faint or lightheaded, blurry vision Low red blood cell level--unusual weakness or fatigue, dizziness, headache, trouble breathing Painful swelling, warmth, or redness of the skin, blisters or sores at the infusion site Pain, tingling, or numbness in the hands or feet Slow heartbeat--dizziness, feeling faint or lightheaded, confusion, trouble breathing, unusual weakness  or fatigue Unusual bruising or bleeding Side effects that usually do not require medical attention (report to your care team if they continue or are bothersome): Diarrhea Hair loss Joint pain Loss of appetite Muscle pain Nausea Vomiting This list may not describe all possible side effects. Call your doctor for medical advice about side effects. You may report side effects to FDA at 1-800-FDA-1088. Where should I keep my medication? This medication is given in a hospital or clinic. It will not be stored at home. NOTE: This sheet is a summary. It may not cover all possible information. If you have questions about this medicine, talk to your doctor,  pharmacist, or health care provider.  2024 Elsevier/Gold Standard (2021-12-21 00:00:00)   Carboplatin  Injection What is this medication? CARBOPLATIN  (KAR boe pla tin) treats some types of cancer. It works by slowing down the growth of cancer cells. This medicine may be used for other purposes; ask your health care provider or pharmacist if you have questions. COMMON BRAND NAME(S): Paraplatin  What should I tell my care team before I take this medication? They need to know if you have any of these conditions: Blood disorders Hearing problems Kidney disease Recent or ongoing radiation therapy An unusual or allergic reaction to carboplatin , cisplatin, other medications, foods, dyes, or preservatives Pregnant or trying to get pregnant Breast-feeding How should I use this medication? This medication is injected into a vein. It is given by your care team in a hospital or clinic setting. Talk to your care team about the use of this medication in children. Special care may be needed. Overdosage: If you think you have taken too much of this medicine contact a poison control center or emergency room at once. NOTE: This medicine is only for you. Do not share this medicine with others. What if I miss a dose? Keep appointments for follow-up doses. It is important not to miss your dose. Call your care team if you are unable to keep an appointment. What may interact with this medication? Medications for seizures Some antibiotics, such as amikacin, gentamicin, neomycin, streptomycin, tobramycin Vaccines This list may not describe all possible interactions. Give your health care provider a list of all the medicines, herbs, non-prescription drugs, or dietary supplements you use. Also tell them if you smoke, drink alcohol, or use illegal drugs. Some items may interact with your medicine. What should I watch for while using this medication? Your condition will be monitored carefully while you are  receiving this medication. You may need blood work while taking this medication. This medication may make you feel generally unwell. This is not uncommon, as chemotherapy can affect healthy cells as well as cancer cells. Report any side effects. Continue your course of treatment even though you feel ill unless your care team tells you to stop. In some cases, you may be given additional medications to help with side effects. Follow all directions for their use. This medication may increase your risk of getting an infection. Call your care team for advice if you get a fever, chills, sore throat, or other symptoms of a cold or flu. Do not treat yourself. Try to avoid being around people who are sick. Avoid taking medications that contain aspirin, acetaminophen, ibuprofen, naproxen, or ketoprofen unless instructed by your care team. These medications may hide a fever. Be careful brushing or flossing your teeth or using a toothpick because you may get an infection or bleed more easily. If you have any dental work done, tell your dentist  you are receiving this medication. Talk to your care team if you wish to become pregnant or think you might be pregnant. This medication can cause serious birth defects. Talk to your care team about effective forms of contraception. Do not breast-feed while taking this medication. What side effects may I notice from receiving this medication? Side effects that you should report to your care team as soon as possible: Allergic reactions--skin rash, itching, hives, swelling of the face, lips, tongue, or throat Infection--fever, chills, cough, sore throat, wounds that don't heal, pain or trouble when passing urine, general feeling of discomfort or being unwell Low red blood cell level--unusual weakness or fatigue, dizziness, headache, trouble breathing Pain, tingling, or numbness in the hands or feet, muscle weakness, change in vision, confusion or trouble speaking, loss of  balance or coordination, trouble walking, seizures Unusual bruising or bleeding Side effects that usually do not require medical attention (report to your care team if they continue or are bothersome): Hair loss Nausea Unusual weakness or fatigue Vomiting This list may not describe all possible side effects. Call your doctor for medical advice about side effects. You may report side effects to FDA at 1-800-FDA-1088. Where should I keep my medication? This medication is given in a hospital or clinic. It will not be stored at home. NOTE: This sheet is a summary. It may not cover all possible information. If you have questions about this medicine, talk to your doctor, pharmacist, or health care provider.  2024 Elsevier/Gold Standard (2021-11-23 00:00:00)       To help prevent nausea and vomiting after your treatment, we encourage you to take your nausea medication as directed.  BELOW ARE SYMPTOMS THAT SHOULD BE REPORTED IMMEDIATELY: *FEVER GREATER THAN 100.4 F (38 C) OR HIGHER *CHILLS OR SWEATING *NAUSEA AND VOMITING THAT IS NOT CONTROLLED WITH YOUR NAUSEA MEDICATION *UNUSUAL SHORTNESS OF BREATH *UNUSUAL BRUISING OR BLEEDING *URINARY PROBLEMS (pain or burning when urinating, or frequent urination) *BOWEL PROBLEMS (unusual diarrhea, constipation, pain near the anus) TENDERNESS IN MOUTH AND THROAT WITH OR WITHOUT PRESENCE OF ULCERS (sore throat, sores in mouth, or a toothache) UNUSUAL RASH, SWELLING OR PAIN  UNUSUAL VAGINAL DISCHARGE OR ITCHING   Items with * indicate a potential emergency and should be followed up as soon as possible or go to the Emergency Department if any problems should occur.  Please show the CHEMOTHERAPY ALERT CARD or IMMUNOTHERAPY ALERT CARD at check-in to the Emergency Department and triage nurse.  Should you have questions after your visit or need to cancel or reschedule your appointment, please contact [facility] - A DEPT OF [facility] [phone]  and follow the prompts.  Office hours are 8:00 a.m. to 4:30 p.m. Monday - Friday. Please note that voicemails left after 4:00 p.m. may not be returned until the following business day.  We are closed weekends and major holidays. You have access to a nurse at all times for urgent questions. Please call the main number to the clinic [phone] and follow the prompts.  For any non-urgent questions, you may also contact your provider using MyChart. We now offer e-Visits for anyone 49 and older to request care online for non-urgent symptoms. For details visit [email or web address].   Also download the MyChart app! Go to the app store, search "MyChart", open the app, select [place], and log in with your MyChart username and password.

## 2024-01-02 NOTE — Patient Instructions (Signed)

## 2024-01-04 ENCOUNTER — Inpatient Hospital Stay

## 2024-01-04 VITALS — BP 139/85 | HR 85 | Temp 97.9°F | Resp 18

## 2024-01-04 DIAGNOSIS — C3412 Malignant neoplasm of upper lobe, left bronchus or lung: Secondary | ICD-10-CM

## 2024-01-04 DIAGNOSIS — Z5111 Encounter for antineoplastic chemotherapy: Secondary | ICD-10-CM | POA: Diagnosis not present

## 2024-01-04 MED ORDER — PEGFILGRASTIM-CBQV 6 MG/0.6ML ~~LOC~~ SOSY
6.0000 mg | PREFILLED_SYRINGE | Freq: Once | SUBCUTANEOUS | Status: AC
  Administered 2024-01-04: 6 mg via SUBCUTANEOUS
  Filled 2024-01-04: qty 0.6

## 2024-01-04 NOTE — Patient Instructions (Signed)

## 2024-01-04 NOTE — Progress Notes (Signed)
 Patient tolerated injection with no complaints voiced.  Site clean and dry with no bruising or swelling noted at site.  See MAR for details.  Band aid applied.  Patient stable during and after injection.  Vss with discharge and left in satisfactory condition with no s/s of distress noted.

## 2024-01-11 ENCOUNTER — Ambulatory Visit (HOSPITAL_COMMUNITY)

## 2024-01-11 ENCOUNTER — Other Ambulatory Visit: Payer: Self-pay

## 2024-01-15 ENCOUNTER — Ambulatory Visit (HOSPITAL_COMMUNITY)

## 2024-01-23 ENCOUNTER — Inpatient Hospital Stay: Admitting: Hematology

## 2024-01-23 ENCOUNTER — Ambulatory Visit: Admitting: Hematology

## 2024-01-23 ENCOUNTER — Other Ambulatory Visit

## 2024-01-23 ENCOUNTER — Ambulatory Visit

## 2024-01-25 ENCOUNTER — Ambulatory Visit

## 2024-01-25 ENCOUNTER — Inpatient Hospital Stay: Admitting: Hematology

## 2024-01-29 ENCOUNTER — Ambulatory Visit (HOSPITAL_COMMUNITY)
Admission: RE | Admit: 2024-01-29 | Discharge: 2024-01-29 | Disposition: A | Discharge status: Home / Self Care | Source: Ambulatory Visit | Attending: Hematology | Admitting: Hematology

## 2024-01-29 DIAGNOSIS — C3412 Malignant neoplasm of upper lobe, left bronchus or lung: Secondary | ICD-10-CM | POA: Diagnosis present

## 2024-01-29 MED ORDER — IOHEXOL 300 MG/ML  SOLN
75.0000 mL | Freq: Once | INTRAMUSCULAR | Status: AC | PRN
  Administered 2024-01-29: 75 mL via INTRAVENOUS

## 2024-01-30 ENCOUNTER — Ambulatory Visit
Attending: Thoracic Surgery (Cardiothoracic Vascular Surgery) | Admitting: Thoracic Surgery (Cardiothoracic Vascular Surgery)

## 2024-01-30 ENCOUNTER — Encounter: Payer: Self-pay | Admitting: Thoracic Surgery (Cardiothoracic Vascular Surgery)

## 2024-01-30 VITALS — BP 120/82 | HR 89 | Resp 20 | Ht 61.0 in | Wt 129.7 lb

## 2024-01-30 DIAGNOSIS — C3412 Malignant neoplasm of upper lobe, left bronchus or lung: Secondary | ICD-10-CM | POA: Diagnosis not present

## 2024-01-30 NOTE — Progress Notes (Signed)
 [address] [place] [address]             [phone]     HPI: 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  She has been treated with neoadjuvant chemoimmunotherapy by Dr. Cheree Rivas.  She received 4 cycles of carboplatin , paclitaxel , and Opdivo .  She saw Dr. Cheree Rivas a couple of weeks ago.  Was complaining of some left-sided chest pain.  Today she is only complaining of pain related to her Port-A-Cath.  She wants that removed.  She says she does not want any additional treatment and will let God take care of it.  No interest in surgical resection.  Past Medical History:  Diagnosis Date   Anxiety    Hypercholesteremia    Hypertension    Lung cancer (HCC)    Neurofibromatosis, type 1 [facility])    saw Dermatology [facility] 2023    Current Outpatient Medications  Medication Sig Dispense Refill   amLODipine (NORVASC) 5 MG tablet Take 5 mg by mouth in the morning.     aspirin EC 81 MG tablet Take 81 mg by mouth in the morning.     dexamethasone  (DECADRON ) 2 MG tablet Take 1 tablet (2 mg total) by mouth daily with breakfast. 7 tablet 0   fluticasone (FLONASE) 50 MCG/ACT nasal spray Place 1 spray into both nostrils daily as needed for allergies.     Fosaprepitant  Dimeglumine (EMEND IV) Inject into the vein.     gabapentin  (NEURONTIN ) 300 MG capsule Take 1 capsule (300 mg total) by mouth See admin instructions. Take 1 capsule (300 mg) by mouth scheduled at bedtime, may take up to 2 additional dose if needed for pain. 90 capsule 11   hydrOXYzine (ATARAX) 25 MG tablet Take 25 mg by mouth daily as needed for anxiety.     ibuprofen (ADVIL) 200 MG tablet Take 400 mg by mouth every 8 (eight) hours as needed  (pain).     Lactulose  20 GM/30ML SOLN Take 30 mLs (20 g total) by mouth daily as needed. Take 30 ml by mouth every 3 hours until you have a bowel movement, then use daily as needed for constipation 450 mL 1   lidocaine -prilocaine  (EMLA ) cream Apply 1 Application topically as needed. 30 g 0   megestrol  (MEGACE ) 40 MG/ML suspension Take 10 mLs (400 mg total) by mouth 2 (two) times daily. 480 mL 2   mirtazapine (REMERON SOL-TAB) 45 MG disintegrating tablet Take 45 mg by mouth at bedtime.     naproxen sodium (ALEVE) 220 MG tablet Take 220-440 mg by mouth 2 (two) times daily as needed (pain.).     ondansetron  (ZOFRAN ) 4 MG tablet Take 1 tablet (4 mg total) by mouth every 8 (eight) hours as needed. 30 tablet 1   oxyCODONE -acetaminophen  (PERCOCET) 10-325 MG tablet Take 1 tablet by mouth every 12 (twelve) hours as needed for pain. 60 tablet 0   Palonosetron  HCl (ALOXI  IV) Inject into the vein.     potassium chloride  SA (KLOR-CON  M) 20 MEQ tablet Take 1 tablet (20 mEq total) by mouth daily. 30 tablet 5   pravastatin (PRAVACHOL) 40 MG tablet Take 40 mg by mouth daily.     prochlorperazine  (COMPAZINE ) 10 MG tablet  Take 1 tablet (10 mg total) by mouth every 6 (six) hours as needed for nausea or vomiting. 60 tablet 2   sertraline (ZOLOFT) 100 MG tablet Take 100 mg by mouth in the morning.     No current facility-administered medications for this visit.    Physical Exam BP 120/82 (BP Location: Right Arm, Patient Position: Sitting, Cuff Size: Normal)   Pulse 89   Resp 20   Ht 5' 1 (1.549 m)   Wt 129 lb 11.2 oz (58.8 kg)   SpO2 95% Comment: RA  BMI 24.22 kg/m  63 year old woman in no acute distress Multiple skin nodules Lungs clear Cardiac regular rate and rhythm   Diagnostic Tests: CT CHEST WITH CONTRAST   TECHNIQUE: Multidetector CT imaging of the chest was performed during intravenous contrast administration.   RADIATION DOSE REDUCTION: This exam was performed according to  the departmental dose-optimization program which includes automated exposure control, adjustment of the mA and/or kV according to patient size and/or use of iterative reconstruction technique.   CONTRAST:  75mL OMNIPAQUE  IOHEXOL  300 MG/ML  SOLN   COMPARISON:  CT scan chest from 07/23/2023.   FINDINGS: Cardiovascular: Normal cardiac size. No pericardial effusion. No aortic aneurysm. There are moderate peripheral atherosclerotic vascular calcifications of thoracic aorta and its major branches.   Mediastinum/Nodes: Visualized thyroid  gland appears grossly unremarkable. No solid / cystic mediastinal masses. The esophagus is nondistended precluding optimal assessment. There are few mildly prominent mediastinal and hilar lymph nodes, which do not meet the size criteria for lymphadenopathy and appear grossly similar to the prior study, favoring benign etiology. No axillary lymphadenopathy by size criteria.   Lungs/Pleura: The central tracheo-bronchial tree is patent. There mild-to-moderate upper lobe predominant emphysematous changes.   There is continued interval increase in the large heterogeneous mass centered in the left upper lobe, anteriorly currently measuring 7.6 x 10.1 cm. The mass is invading the left upper anterior chest wall and there is destruction of left second through fourth ribs anteromedial ance. There is also loss of fat planes between the mass and the left pectoralis major muscle as well as intercostal muscles. There is small area of air within the mass, suggesting central cavitation/necrosis. The mass also abuts the prevascular fat however, there is minimal fat plane preserved between the mass and the aortic arch. Anteriorly the mass also is in close proximity to the left-sided internal mammary vessels without occlusion.   No new mass or consolidation. No pleural effusion or pneumothorax. Redemonstration of stable, sub 6 mm calcified and noncalcified nodules, 1  each in bilateral lungs, in the left lung - upper lobe (series 2005, image 37), and in the right lung - middle lobe series 2005, image 95). No new or suspicious lung nodule.   Upper Abdomen: Surgically absent gallbladder. There is a simple cyst in the right kidney upper pole measuring up to 1.9 x 2.0 cm. Remaining visualized upper abdominal viscera within normal limits.   Musculoskeletal: A CT Port-a-Cath is seen in the right upper chest wall with the catheter terminating in the lower portion of superior vena cava. Visualized soft tissues of the chest wall are otherwise grossly unremarkable. No suspicious osseous lesions. There are mild multilevel degenerative changes in the visualized spine.   IMPRESSION: 1. Continued interval increase in the size of large heterogeneous mass centered in the left upper lobe, currently measuring 7.6 x 10.1 cm. The mass is invading the left upper anterior chest wall with destruction of left second through fourth ribs anteromedially. Please  see above for details. 2. No new lung mass or consolidation. No new or suspicious lung nodule. 3. No mediastinal or hilar lymphadenopathy by size criteria. 4. Multiple other nonacute observations, as described above.   Aortic Atherosclerosis (ICD10-I70.0) and Emphysema (ICD10-J43.9).     Electronically Signed   By: Beula Brunswick M.D.   On: 01/29/2024 10:00 I personally reviewed the CT images.  There has been progression both in the size of the mass and the degree of chest wall involvement in the interim since her previous scans.  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   Stage IIb squamous cell carcinoma left upper lobe-status post 4 cycles of neoadjuvant chemoimmunotherapy.  Has had progression of disease.  Tumor now  measures up to 10 cm with destruction of the 2nd, 3rd and 4th ribs.  Not a good candidate for surgery.  She also does not want surgery.  Recommend referral to radiation oncology for consideration for palliative radiation.  Plan: Follow-up with Dr. Katragadda as scheduled next Monday   Zelphia Higashi, MD [facility] [phone]

## 2024-02-05 ENCOUNTER — Inpatient Hospital Stay: Attending: Hematology | Admitting: Hematology

## 2024-02-05 ENCOUNTER — Other Ambulatory Visit: Payer: Self-pay

## 2024-02-05 VITALS — BP 128/95 | HR 99 | Temp 98.4°F | Resp 20 | Wt 129.6 lb

## 2024-02-05 DIAGNOSIS — Z79899 Other long term (current) drug therapy: Secondary | ICD-10-CM | POA: Diagnosis not present

## 2024-02-05 DIAGNOSIS — C3412 Malignant neoplasm of upper lobe, left bronchus or lung: Secondary | ICD-10-CM | POA: Insufficient documentation

## 2024-02-05 DIAGNOSIS — E876 Hypokalemia: Secondary | ICD-10-CM | POA: Insufficient documentation

## 2024-02-05 MED ORDER — MEGESTROL ACETATE 40 MG/ML PO SUSP: 400.0000 mg | Freq: Two times a day (BID) | ORAL | 2 refills | Status: AC

## 2024-02-05 MED ORDER — OXYCODONE-ACETAMINOPHEN 10-325 MG PO TABS: 1.0000 | ORAL_TABLET | Freq: Two times a day (BID) | ORAL | 0 refills | Status: DC | PRN

## 2024-02-05 NOTE — Progress Notes (Signed)
 [facility] [address] [place] [address]    Clinic Day:  02/05/2024  Referring physician: [name], FNP  Patient Care Team: [name], FNP as PCP - General (Family Medicine) Denise Ozell NOVAK, MD as Consulting Physician (Pulmonary Disease)   ASSESSMENT & PLAN:   Assessment: 1.  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  Mass abuts anterior pleural surface.  There is mild cortical erosion along the inner aspect of the left anterior third rib abutting the left upper lobe mass consistent with direct invasion.  Indeterminate 5 mm subsolid LUL nodule too small to characterize. - Denies any fevers, night sweats or weight loss.  No hemoptysis. - She reports burning all over the body.  Also reports left chest wall pain, aching type preventing her to sleep on the left side.  She is taking Tylenol  as needed which helps. - Guardant360: PIK3CA E545K, PTEN deletion (exon 1), T p53, BLM - PET scan (09/01/2023): 7 cm anterior left upper lobe mass invading left chest wall with destruction of the left third anterior rib.  No adenopathy or distant metastatic disease.  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 - MRI brain (08/31/2022): No evidence of metastatic disease. - Left upper lobe biopsy (09/11/2023): Invasive moderately differentiated squamous cell carcinoma. - 4 cycles of neoadjuvant carboplatin , paclitaxel  and nivolumab  from 10/31/2023 through 01/02/2024   2. Social/Family History: -Lives at home alone and does not work. Tobacco use of 1 cigarette a day since her 20's. No chemical exposures.  -Mother had uterine cancer. 2 sisters had breast cancer.     Plan: 1.  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 - CT chest (6/16): interval increase in size of LUL mass 7.6x10.1cm, invading L upper anterior chest wall with destruction of left 2nd-4rth ribs. No new masses or asenopathy. - she was seen by Dr.Hendrickson. Not a surgical candidate. - Will refer to Radiation Oncology. Rtc 2 weeks. Discussed with her Niece over phone.   2.  Left chest wall pain: - Pain well controlled. Take percocet as needed.   3.  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  4.  Decreased appetite: - Continue megace  bid and ensure daily.  5.  Hypokalemia: - Continue K-Dur 20 mEq daily.  Potassium is normal.    No orders of the defined types were placed in this encounter.     Denise Rivas,acting as a Neurosurgeon for Alean Stands, MD.,have documented all relevant documentation on the behalf of Alean Stands, MD,as directed by  Alean Stands, MD while in the presence of Alean Stands, MD.  I, Alean Stands MD, have reviewed the above documentation for accuracy and completeness, and I agree with the above.     Alean Stands, MD   6/23/20254:08 PM  CHIEF COMPLAINT:   Diagnosis: LUL squamous cell lung cancer   Cancer Staging  Primary squamous cell carcinoma of upper lobe of left lung [facility]) Staging form: Lung, AJCC V9 - Clinical stage from 09/20/2023: Stage IIB (cT3, cN0, cM0) - Unsigned    Prior Therapy: none  Current Therapy: Neoadjuvant chemoimmunotherapy  HISTORY OF PRESENT ILLNESS:   Oncology History  Primary squamous cell carcinoma of upper lobe of left lung (HCC)  09/20/2023 Initial Diagnosis   Primary squamous cell carcinoma of upper lobe of left lung (HCC)   10/31/2023 -  Chemotherapy   Patient is on Treatment Plan : LUNG Opdivo  + Carboplatin  + Paclitaxel  q21d        INTERVAL HISTORY:   Denise Rivas is a 63 y.o. female presenting to clinic today for  follow up of LUL squamous cell lung cancer. She was last seen by me on 01/02/24.  Since her last visit, she underwent CT chest on 01/29/24 that found: Continued interval increase in the size of large heterogeneous mass centered in the left upper lobe, currently measuring 7.6 x 10.1 cm. The mass is invading the left upper anterior chest wall with destruction of left second through fourth ribs anteromedially. No new lung mass or consolidation. No new or suspicious lung nodule. No mediastinal or hilar lymphadenopathy by size criteria.  Today, she states that she is doing well overall. Her appetite level is at 80%. Her energy level is at 100%.    PAST MEDICAL HISTORY:   Past Medical History: Past Medical History:  Diagnosis Date   Anxiety    Hypercholesteremia    Hypertension    Lung cancer (HCC)    Neurofibromatosis, type 1 [facility])    saw Dermatology [facility] 2023    Surgical History: Past Surgical History:  Procedure Laterality Date   ABDOMINAL HYSTERECTOMY     PORTACATH PLACEMENT Right 10/12/2023   Procedure: INSERTION PORT-A-CATH;  Surgeon: Kallie Manuelita BROCKS, MD;  Location: [facility];  Service: General;  Laterality: Right;    Social History: Social History   Socioeconomic History   Marital status: Single    Spouse name: Not on file   Number of children: Not on file   Years of education: Not on file   Highest education level: Not on file  Occupational History   Not on file  Tobacco Use   Smoking status: Some Days    Types: Cigarettes   Smokeless tobacco: Never  Vaping Use   Vaping status: Never Used  Substance and Sexual Activity   Alcohol use: Not Currently   Drug use: Never   Sexual activity: Not on file  Other Topics Concern   Not on file  Social History Narrative   Not on file   Social Drivers of Health   Financial Resource Strain: Not on file  Food Insecurity: Not on file  Transportation Needs: Not on file  Physical Activity: Not on file  Stress: Not on  file  Social Connections: Unknown (06/14/2022)   Received from [facility]   Social Network    Social Network: Not on file  Intimate Partner Violence: Unknown (06/14/2022)   Received from [facility]   HITS    Physically Hurt: Not on file    Insult or Talk Down To: Not on file    Threaten Physical Harm: Not on file    Scream or Curse: Not on file    Family History: No family history on file.  Current Medications:  Current Outpatient Medications:    amLODipine (NORVASC) 5 MG tablet, Take 5 mg by mouth in the morning., Disp: , Rfl:    aspirin EC 81 MG tablet, Take 81 mg by mouth in the morning., Disp: , Rfl:    dexamethasone  (DECADRON ) 2 MG tablet, Take 1 tablet (2 mg total) by mouth daily with breakfast., Disp:  7 tablet, Rfl: 0   fluticasone (FLONASE) 50 MCG/ACT nasal spray, Place 1 spray into both nostrils daily as needed for allergies., Disp: , Rfl:    Fosaprepitant  Dimeglumine (EMEND IV), Inject into the vein., Disp: , Rfl:    gabapentin  (NEURONTIN ) 300 MG capsule, Take 1 capsule (300 mg total) by mouth See admin instructions. Take 1 capsule (300 mg) by mouth scheduled at bedtime, may take up to 2 additional dose if needed for pain., Disp: 90 capsule, Rfl: 11   hydrOXYzine (ATARAX) 25 MG tablet, Take 25 mg by mouth daily as needed for anxiety., Disp: , Rfl:    ibuprofen (ADVIL) 200 MG tablet, Take 400 mg by mouth every 8 (eight) hours as needed (pain)., Disp: , Rfl:    Lactulose  20 GM/30ML SOLN, Take 30 mLs (20 g total) by mouth daily as needed. Take 30 ml by mouth every 3 hours until you have a bowel movement, then use daily as needed for constipation, Disp: 450 mL, Rfl: 1   lidocaine -prilocaine  (EMLA ) cream, Apply 1 Application topically as needed., Disp: 30 g, Rfl: 0   mirtazapine (REMERON SOL-TAB) 45 MG disintegrating tablet, Take 45 mg by mouth at bedtime., Disp: , Rfl:    naproxen sodium (ALEVE) 220 MG tablet, Take 220-440 mg by mouth 2 (two) times daily as needed (pain.).,  Disp: , Rfl:    ondansetron  (ZOFRAN ) 4 MG tablet, Take 1 tablet (4 mg total) by mouth every 8 (eight) hours as needed., Disp: 30 tablet, Rfl: 1   Palonosetron  HCl (ALOXI  IV), Inject into the vein., Disp: , Rfl:    potassium chloride  SA (KLOR-CON  M) 20 MEQ tablet, Take 1 tablet (20 mEq total) by mouth daily., Disp: 30 tablet, Rfl: 5   pravastatin (PRAVACHOL) 40 MG tablet, Take 40 mg by mouth daily., Disp: , Rfl:    prochlorperazine  (COMPAZINE ) 10 MG tablet, Take 1 tablet (10 mg total) by mouth every 6 (six) hours as needed for nausea or vomiting., Disp: 60 tablet, Rfl: 2   sertraline (ZOLOFT) 100 MG tablet, Take 100 mg by mouth in the morning., Disp: , Rfl:    megestrol  (MEGACE ) 40 MG/ML suspension, Take 10 mLs (400 mg total) by mouth 2 (two) times daily., Disp: 480 mL, Rfl: 2   oxyCODONE -acetaminophen  (PERCOCET) 10-325 MG tablet, Take 1 tablet by mouth every 12 (twelve) hours as needed for pain., Disp: 60 tablet, Rfl: 0   Allergies: No Known Allergies  REVIEW OF SYSTEMS:   Review of Systems  Constitutional:  Negative for chills, fatigue and fever.  HENT:   Negative for lump/mass, mouth sores, nosebleeds, sore throat and trouble swallowing.   Eyes:  Negative for eye problems.  Respiratory:  Negative for cough and shortness of breath.   Cardiovascular:  Negative for chest pain, leg swelling and palpitations.  Gastrointestinal:  Negative for abdominal pain, constipation, diarrhea, nausea and vomiting.  Genitourinary:  Negative for bladder incontinence, difficulty urinating, dysuria, frequency, hematuria and nocturia.   Musculoskeletal:  Negative for arthralgias, back pain, flank pain, myalgias and neck pain.  Skin:  Negative for itching and rash.  Neurological:  Negative for dizziness, headaches and numbness.  Hematological:  Does not bruise/bleed easily.  Psychiatric/Behavioral:  Negative for depression, sleep disturbance and suicidal ideas. The patient is not nervous/anxious.   All other  systems reviewed and are negative.    VITALS:   Blood pressure (!) 128/95, pulse 99, temperature 98.4 F (36.9 C), temperature source Oral, resp. rate 20, weight 129 lb 10.1 oz (  58.8 kg), SpO2 99%.  Wt Readings from Last 3 Encounters:  02/05/24 129 lb 10.1 oz (58.8 kg)  01/30/24 129 lb 11.2 oz (58.8 kg)  01/02/24 129 lb 10.1 oz (58.8 kg)    Body mass index is 24.49 kg/m.  Performance status (ECOG): 1 - Symptomatic but completely ambulatory  PHYSICAL EXAM:   Physical Exam Vitals and nursing note reviewed. Exam conducted with a chaperone present.  Constitutional:      Appearance: Normal appearance.   Cardiovascular:     Rate and Rhythm: Normal rate and regular rhythm.     Pulses: Normal pulses.     Heart sounds: Normal heart sounds.  Pulmonary:     Effort: Pulmonary effort is normal.     Breath sounds: Normal breath sounds.  Abdominal:     Palpations: Abdomen is soft. There is no hepatomegaly, splenomegaly or mass.     Tenderness: There is no abdominal tenderness.   Musculoskeletal:     Right lower leg: No edema.     Left lower leg: No edema.  Lymphadenopathy:     Cervical: No cervical adenopathy.     Right cervical: No superficial, deep or posterior cervical adenopathy.    Left cervical: No superficial, deep or posterior cervical adenopathy.     Upper Body:     Right upper body: No supraclavicular or axillary adenopathy.     Left upper body: No supraclavicular or axillary adenopathy.   Neurological:     General: No focal deficit present.     Mental Status: She is alert and oriented to person, place, and time.   Psychiatric:        Mood and Affect: Mood normal.        Behavior: Behavior normal.     LABS:   CBC     Component Value Date/Time   WBC 11.4 (H) 01/02/2024 0824   RBC 3.41 (L) 01/02/2024 0824   HGB 11.4 (L) 01/02/2024 0824   HCT 34.5 (L) 01/02/2024 0824   PLT 343 01/02/2024 0824   MCV 101.2 (H) 01/02/2024 0824   MCH 33.4 01/02/2024 0824    MCHC 33.0 01/02/2024 0824   RDW 22.7 (H) 01/02/2024 0824   LYMPHSABS 1.9 01/02/2024 0824   MONOABS 1.6 (H) 01/02/2024 0824   EOSABS 0.0 01/02/2024 0824   BASOSABS 0.1 01/02/2024 0824    CMP      Component Value Date/Time   NA 134 (L) 01/02/2024 0824   K 3.5 01/02/2024 0824   CL 106 01/02/2024 0824   CO2 19 (L) 01/02/2024 0824   GLUCOSE 89 01/02/2024 0824   BUN 14 01/02/2024 0824   CREATININE 0.48 01/02/2024 0824   CALCIUM 9.0 01/02/2024 0824   PROT 7.2 01/02/2024 0824   ALBUMIN 3.3 (L) 01/02/2024 0824   AST 15 01/02/2024 0824   ALT 17 01/02/2024 0824   ALKPHOS 71 01/02/2024 0824   BILITOT 0.4 01/02/2024 0824   GFRNONAA >60 01/02/2024 0824   GFRAA >60 02/18/2020 0932     No results found for: CEA1, CEA / No results found for: CEA1, CEA No results found for: PSA1 No results found for: CAN199 No results found for: CAN125  No results found for: TOTALPROTELP, ALBUMINELP, A1GS, A2GS, BETS, BETA2SER, GAMS, MSPIKE, SPEI No results found for: TIBC, FERRITIN, IRONPCTSAT No results found for: LDH   STUDIES:   CT CHEST W CONTRAST Result Date: 01/29/2024 CLINICAL DATA:  Non-small cell lung cancer (NSCLC), non-metastatic, assess treatment response. * Tracking Code: BO * EXAM: CT  CHEST WITH CONTRAST TECHNIQUE: Multidetector CT imaging of the chest was performed during intravenous contrast administration. RADIATION DOSE REDUCTION: This exam was performed according to the departmental dose-optimization program which includes automated exposure control, adjustment of the mA and/or kV according to patient size and/or use of iterative reconstruction technique. CONTRAST:  75mL OMNIPAQUE  IOHEXOL  300 MG/ML  SOLN COMPARISON:  CT scan chest from 07/23/2023. FINDINGS: Cardiovascular: Normal cardiac size. No pericardial effusion. No aortic aneurysm. There are moderate peripheral atherosclerotic vascular calcifications of thoracic aorta and its major branches.  Mediastinum/Nodes: Visualized thyroid  gland appears grossly unremarkable. No solid / cystic mediastinal masses. The esophagus is nondistended precluding optimal assessment. There are few mildly prominent mediastinal and hilar lymph nodes, which do not meet the size criteria for lymphadenopathy and appear grossly similar to the prior study, favoring benign etiology. No axillary lymphadenopathy by size criteria. Lungs/Pleura: The central tracheo-bronchial tree is patent. There mild-to-moderate upper lobe predominant emphysematous changes. There is continued interval increase in the large heterogeneous mass centered in the left upper lobe, anteriorly currently measuring 7.6 x 10.1 cm. The mass is invading the left upper anterior chest wall and there is destruction of left second through fourth ribs anteromedial ance. There is also loss of fat planes between the mass and the left pectoralis major muscle as well as intercostal muscles. There is small area of air within the mass, suggesting central cavitation/necrosis. The mass also abuts the prevascular fat however, there is minimal fat plane preserved between the mass and the aortic arch. Anteriorly the mass also is in close proximity to the left-sided internal mammary vessels without occlusion. No new mass or consolidation. No pleural effusion or pneumothorax. Redemonstration of stable, sub 6 mm calcified and noncalcified nodules, 1 each in bilateral lungs, in the left lung - upper lobe (series 2005, image 37), and in the right lung - middle lobe series 2005, image 95). No new or suspicious lung nodule. Upper Abdomen: Surgically absent gallbladder. There is a simple cyst in the right kidney upper pole measuring up to 1.9 x 2.0 cm. Remaining visualized upper abdominal viscera within normal limits. Musculoskeletal: A CT Port-a-Cath is seen in the right upper chest wall with the catheter terminating in the lower portion of superior vena cava. Visualized soft tissues of  the chest wall are otherwise grossly unremarkable. No suspicious osseous lesions. There are mild multilevel degenerative changes in the visualized spine. IMPRESSION: 1. Continued interval increase in the size of large heterogeneous mass centered in the left upper lobe, currently measuring 7.6 x 10.1 cm. The mass is invading the left upper anterior chest wall with destruction of left second through fourth ribs anteromedially. Please see above for details. 2. No new lung mass or consolidation. No new or suspicious lung nodule. 3. No mediastinal or hilar lymphadenopathy by size criteria. 4. Multiple other nonacute observations, as described above. Aortic Atherosclerosis (ICD10-I70.0) and Emphysema (ICD10-J43.9). Electronically Signed   By: Ree Molt M.D.   On: 01/29/2024 10:00

## 2024-02-05 NOTE — Patient Instructions (Addendum)
 [facility] at [facility] Discharge Instructions   You were seen and examined today by Dr. Rogers.  He reviewed the results of your lab work which are normal/stable.   He reviewed the results of your CT scan which is showing that the cancer has grown. Dr. MARLA wants to refer you to the radiation oncologist in [place] to treat the cancer. We will likely give chemo along with the radiation. Radiation is 5 days a week (Monday-Friday). Chemo would be given in our clinic once a week.   We will see you back in 2 weeks.   Return as scheduled.    Thank you for choosing [facility] at [facility] to provide your oncology and hematology care.  To afford each patient quality time with our provider, please arrive at least 15 minutes before your scheduled appointment time.   If you have a lab appointment with the Cancer Center please come in thru the Main Entrance and check in at the main information desk.  You need to re-schedule your appointment should you arrive 10 or more minutes late.  We strive to give you quality time with our providers, and arriving late affects you and other patients whose appointments are after yours.  Also, if you no show three or more times for appointments you may be dismissed from the clinic at the providers discretion.     Again, thank you for choosing [facility]  Our hope is that these requests will decrease the amount of time that you wait before being seen by our physicians.       _____________________________________________________________  Should you have questions after your visit to [facility], please contact our office at [phone] and follow the prompts.  Our office hours are 8:00 a.m. and 4:30 p.m. Monday - Friday.  Please note that voicemails left after 4:00 p.m. may not be returned until the following business day.  We are closed weekends and major holidays.  You do have access to  a nurse 24-7, just call the main number to the clinic [phone] and do not press any options, hold on the line and a nurse will answer the phone.    For prescription refill requests, have your pharmacy contact our office and allow 72 hours.    Due to Covid, you will need to wear a mask upon entering the hospital. If you do not have a mask, a mask will be given to you at the Main Entrance upon arrival. For doctor visits, patients may have 1 support person age 77 or older with them. For treatment visits, patients can not have anyone with them due to social distancing guidelines and our immunocompromised population.

## 2024-02-06 ENCOUNTER — Other Ambulatory Visit: Payer: Self-pay

## 2024-02-07 NOTE — Progress Notes (Signed)
 Thoracic Location of Tumor / Histology: Left Upper Lobe Lung   Patient presented to the ER in December 2024 with complaints of left chest wall pain.  Imaging revealed a spiculated left upper lobe mass measuring 6.4 x 4.6 x 5.2 cm.  CT Chest 01/29/2024: Continued interval increase in the size of large heterogeneous mass centered in the left upper lobe, currently measuring 7.6 x 10.1 cm. The mass is invading the left upper anterior chest wall with destruction of left second through fourth ribs anteromedially.  PET 09/01/2023: 7 cm anterior left upper lobe lung mass is markedly hypermetabolic and is invading the left chest wall with destruction of the left third anterior rib.  No enlarged or hypermetabolic mediastinal or hilar lymph nodes.  No findings for distant metastatic disease.  MRI Brain 09/01/2023: No evidence of metastatic disease to the brain.   CT Chest 07/23/2023:  Spiculated left upper lobe mass measuring up to 6.4 cm, consistent with bronchogenic malignancy. This mass involves the anterior pleural surface, with direct invasion and erosion of the inner cortex of the left anterior third rib.    Biopsies of Left Upper Lobe Lung 09/11/2023    Past/Anticipated interventions by cardiothoracic surgery, if any:  Dr. Kerrin 01/30/2024 -Not a good candidate for surgery.  She also does not want surgery. -Recommend referral to radiation oncology for consideration for palliative radiation.   Past/Anticipated interventions by medical oncology, if any:  Dr. Rogers 02/05/2024 - She completed 4 cycles of chemo-immunotherapy. - CT chest (6/16): interval increase in size of LUL mass 7.6x10.1cm, invading L upper anterior chest wall with destruction of left 2nd-4th ribs. No new masses or asenopathy. - she was seen by Dr.Hendrickson. Not a surgical candidate. - Will refer to Radiation Oncology. -Chemo infusion 02/19/24   Tobacco/Marijuana/Snuff/ETOH use: 1 cigarette per  day.  Signs/Symptoms Weight changes, if any: Stable Respiratory complaints, if any: Denies SOB. Hemoptysis, if any: Denies cough. Pain issues, if any:  Denies chest pain, pressure, or tightness.  SAFETY ISSUES: Prior radiation? No Pacemaker/ICD?  No Possible current pregnancy? Hysterectomy Is the patient on methotrexate? No  Current Complaints / other details:

## 2024-02-08 ENCOUNTER — Encounter: Payer: Self-pay | Admitting: Radiation Oncology

## 2024-02-08 ENCOUNTER — Ambulatory Visit
Admission: RE | Admit: 2024-02-08 | Discharge: 2024-02-08 | Disposition: A | Discharge status: Home / Self Care | Source: Ambulatory Visit | Attending: Radiation Oncology | Admitting: Radiation Oncology

## 2024-02-08 VITALS — Ht 61.0 in | Wt 129.0 lb

## 2024-02-08 DIAGNOSIS — C3412 Malignant neoplasm of upper lobe, left bronchus or lung: Secondary | ICD-10-CM

## 2024-02-08 NOTE — Progress Notes (Addendum)
 Radiation Oncology         [phone] ________________________________  Initial Outpatient Consultation - Conducted via telephone at patient request.  I spoke with the patient to conduct this consult visit via telephone. The patient was notified in advance and was offered an in person or telemedicine meeting to allow for face to face communication but instead preferred to proceed with a telephone consult.   Name: Denise Rivas        MRN: [id number]  Date of Service: 02/08/2024 DOB: November 19, 1960  RR:Rnoopwd, Denise LITTIE, FNP  Rogers Hai, MD     REFERRING PHYSICIAN: Rogers Hai, MD   ------------------------------------------------------------------------------------------------ Addendum Please see the note from Denise Husband, PA-C from today's visit for more details of today's encounter.  I have personally performed a diagnostic evaluation through the telephone as requested by the patient on this patient and devised the following assessment and plan.  The patient underwent evaluation today for her diagnosis of T3 N0 M0 non-small cell lung cancer.  This involved a squamous cell carcinoma within the left upper lobe which abuts the left chest wall.  The patient proceeded with neoadjuvant chemotherapy/immunotherapy with the plan to subsequently consider surgery.  However, repeat imaging shows enlargement and progression of this mass which now measures 10.1 cm and with further involvement into the left chest wall.  The patient has seen cardiothoracic surgery recently and she is not felt to be a good surgical candidate.  The patient also indicates that she does not wish to undergo surgery.  I have therefore been asked to see the patient for consideration of radiation treatment at this time.  Today, the patient indicates that she wishes to be aggressive so we discussed a definitive course of chemoradiation treatment for approximately 6-1/2 weeks.  We discussed the rationale of  treatment as well as possible side effects and risks.  All of her questions were answered and she wishes to proceed with treatment as we discussed.  She will be scheduled for simulation in the near future for treatment planning.  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   HISTORY OF PRESENT ILLNESS: Denise Rivas is a 63 y.o. female seen at the request of Dr. Rogers for a diagnosis of lung cancer. She was originally diagnosed with Stage IIB, cT3N0M0, NSCLC, squamous cell carcinoma of the LUL after presenting with chest wall pain and December 2024.  CT imaging from the ER on 07/23/2023 showed a spiculated mass in the left upper lobe measuring 6.4 cm abutting the anterior pleural surface and eroding along the inner aspect of the left anterior third rib abutting the left upper lobe mass with direct invasion.  She underwent biopsy on 09/11/2023 that showed invasive moderately differentiated squamous cell carcinoma, and pet imaging in January 2025 showed hypermetabolic activity in the known left upper lobe mass involving the chest wall with no evidence of adenopathy or metastatic disease.  Her MRI of the brain at that time was negative for disease as well and she was started on 4 cycles of neoadjuvant carboplatin , paclitaxel , and nivolumab  from 10/31/2023 to 01/02/2024.  An interval CT scan of the chest on 01/29/24 showed progression in the left upper lobe mass now measuring up to 10.1 cm invading the left upper anterior chest wall with destruction of left 2nd through 4th  ribs anterior medially.  Given these findings, she is not considered a candidate to consider resection and rather is contacted by phone to consider either palliative radiation  versus chemoradiation.    PREVIOUS RADIATION THERAPY: No   PAST MEDICAL HISTORY:  Past Medical History:  Diagnosis Date   Anxiety    Hypercholesteremia    Hypertension    Lung cancer (HCC)    Neurofibromatosis, type 1 [facility])    saw Dermatology [facility] 2023       PAST SURGICAL HISTORY: Past Surgical History:  Procedure Laterality Date   ABDOMINAL HYSTERECTOMY     PORTACATH PLACEMENT Right 10/12/2023   Procedure: INSERTION PORT-A-CATH;  Surgeon: Kallie Manuelita BROCKS, MD;  Location: [facility];  Service: General;  Laterality: Right;     FAMILY HISTORY: History reviewed. No pertinent family history.   SOCIAL HISTORY:  reports that she has quit smoking. Her smoking use included cigarettes. She has never used smokeless tobacco. She reports that she does not currently use alcohol. She reports that she does not use drugs.  The patient is single and lives in [place] .  Her niece Rosina, and niece Montie both help the patient with medical decision making and transportation to medical appointments.   ALLERGIES: Patient has no known allergies.   MEDICATIONS:  Current Outpatient Medications  Medication Sig Dispense Refill   amLODipine (NORVASC) 5 MG tablet Take 5 mg by mouth in the morning.     aspirin EC 81 MG tablet Take 81 mg by mouth in the morning.     fluticasone (FLONASE) 50 MCG/ACT nasal spray Place 1 spray into both nostrils daily as needed for allergies.     Fosaprepitant  Dimeglumine (EMEND IV) Inject into the vein.     gabapentin  (NEURONTIN ) 300 MG capsule Take 1 capsule (300 mg total) by mouth See admin instructions. Take 1 capsule (300 mg) by mouth scheduled at bedtime, may take up to 2 additional dose if needed for pain. 90 capsule 11   hydrOXYzine (ATARAX) 25 MG tablet Take 25 mg by mouth daily as needed for anxiety.     ibuprofen (ADVIL) 200 MG tablet Take 400 mg by mouth every 8 (eight) hours as needed (pain).     Lactulose  20 GM/30ML SOLN Take 30 mLs (20  g total) by mouth daily as needed. Take 30 ml by mouth every 3 hours until you have a bowel movement, then use daily as needed for constipation 450 mL 1   lidocaine -prilocaine  (EMLA ) cream Apply 1 Application topically as needed. 30 g 0   megestrol  (MEGACE ) 40 MG/ML suspension Take 10 mLs (400 mg total) by mouth 2 (two) times daily. 480 mL 2   mirtazapine (REMERON SOL-TAB) 45 MG disintegrating tablet Take 45 mg by mouth at bedtime.     naproxen sodium (ALEVE) 220 MG tablet Take 220-440 mg by mouth 2 (two) times daily as needed (pain.).     ondansetron  (ZOFRAN ) 4 MG tablet Take 1 tablet (4 mg total) by mouth every 8 (eight) hours as needed. 30 tablet 1   oxyCODONE -acetaminophen  (PERCOCET) 10-325 MG tablet Take 1 tablet by mouth every 12 (twelve) hours as needed for pain. 60 tablet 0   Palonosetron  HCl (ALOXI  IV) Inject into the vein.     potassium chloride  SA (KLOR-CON  M) 20 MEQ tablet Take 1 tablet (20 mEq total) by mouth daily. 30 tablet 5   pravastatin (PRAVACHOL) 40 MG tablet Take 40 mg by mouth daily.     prochlorperazine  (COMPAZINE ) 10  MG tablet Take 1 tablet (10 mg total) by mouth every 6 (six) hours as needed for nausea or vomiting. 60 tablet 2   sertraline (ZOLOFT) 100 MG tablet Take 100 mg by mouth in the morning.     No current facility-administered medications for this encounter.     REVIEW OF SYSTEMS: On review of systems, the patient reports that she is doing well overall and continues to use pain medication for her chest wall pain which she reports is completely controlled at this time.  She denies any sternal chest pain or shortness of breath.  She also denies productive cough or hemoptysis.  No other complaints are verbalized.  PHYSICAL EXAM:  Unable to assess given encounter type.   ECOG = 0  0 - Asymptomatic (Fully active, able to carry on all predisease activities without restriction)  1 - Symptomatic but completely ambulatory (Restricted in physically strenuous activity  but ambulatory and able to carry out work of a light or sedentary nature. For example, light housework, office work)  2 - Symptomatic, <50% in bed during the day (Ambulatory and capable of all self care but unable to carry out any work activities. Up and about more than 50% of waking hours)  3 - Symptomatic, >50% in bed, but not bedbound (Capable of only limited self-care, confined to bed or chair 50% or more of waking hours)  4 - Bedbound (Completely disabled. Cannot carry on any self-care. Totally confined to bed or chair)  5 - Death   Raylene MM, Creech RH, Tormey DC, et al. [phone]). Toxicity and response criteria of the [facility]. Am. DOROTHA Bridges. Oncol. 5 (6): 649-55    LABORATORY DATA:  Lab Results  Component Value Date   WBC 11.4 (H) 01/02/2024   HGB 11.4 (L) 01/02/2024   HCT 34.5 (L) 01/02/2024   MCV 101.2 (H) 01/02/2024   PLT 343 01/02/2024   Lab Results  Component Value Date   NA 134 (L) 01/02/2024   K 3.5 01/02/2024   CL 106 01/02/2024   CO2 19 (L) 01/02/2024   Lab Results  Component Value Date   ALT 17 01/02/2024   AST 15 01/02/2024   ALKPHOS 71 01/02/2024   BILITOT 0.4 01/02/2024      RADIOGRAPHY: CT CHEST W CONTRAST Result Date: 01/29/2024 CLINICAL DATA:  Non-small cell lung cancer (NSCLC), non-metastatic, assess treatment response. * Tracking Code: BO * EXAM: CT CHEST WITH CONTRAST TECHNIQUE: Multidetector CT imaging of the chest was performed during intravenous contrast administration. RADIATION DOSE REDUCTION: This exam was performed according to the departmental dose-optimization program which includes automated exposure control, adjustment of the mA and/or kV according to patient size and/or use of iterative reconstruction technique. CONTRAST:  75mL OMNIPAQUE  IOHEXOL  300 MG/ML  SOLN COMPARISON:  CT scan chest from 07/23/2023. FINDINGS: Cardiovascular: Normal cardiac size. No pericardial effusion. No aortic aneurysm. There are moderate  peripheral atherosclerotic vascular calcifications of thoracic aorta and its major branches. Mediastinum/Nodes: Visualized thyroid  gland appears grossly unremarkable. No solid / cystic mediastinal masses. The esophagus is nondistended precluding optimal assessment. There are few mildly prominent mediastinal and hilar lymph nodes, which do not meet the size criteria for lymphadenopathy and appear grossly similar to the prior study, favoring benign etiology. No axillary lymphadenopathy by size criteria. Lungs/Pleura: The central tracheo-bronchial tree is patent. There mild-to-moderate upper lobe predominant emphysematous changes. There is continued interval increase in the large heterogeneous mass centered in the left upper lobe, anteriorly currently measuring 7.6 x  10.1 cm. The mass is invading the left upper anterior chest wall and there is destruction of left second through fourth ribs anteromedial ance. There is also loss of fat planes between the mass and the left pectoralis major muscle as well as intercostal muscles. There is small area of air within the mass, suggesting central cavitation/necrosis. The mass also abuts the prevascular fat however, there is minimal fat plane preserved between the mass and the aortic arch. Anteriorly the mass also is in close proximity to the left-sided internal mammary vessels without occlusion. No new mass or consolidation. No pleural effusion or pneumothorax. Redemonstration of stable, sub 6 mm calcified and noncalcified nodules, 1 each in bilateral lungs, in the left lung - upper lobe (series 2005, image 37), and in the right lung - middle lobe series 2005, image 95). No new or suspicious lung nodule. Upper Abdomen: Surgically absent gallbladder. There is a simple cyst in the right kidney upper pole measuring up to 1.9 x 2.0 cm. Remaining visualized upper abdominal viscera within normal limits. Musculoskeletal: A CT Port-a-Cath is seen in the right upper chest wall with the  catheter terminating in the lower portion of superior vena cava. Visualized soft tissues of the chest wall are otherwise grossly unremarkable. No suspicious osseous lesions. There are mild multilevel degenerative changes in the visualized spine. IMPRESSION: 1. Continued interval increase in the size of large heterogeneous mass centered in the left upper lobe, currently measuring 7.6 x 10.1 cm. The mass is invading the left upper anterior chest wall with destruction of left second through fourth ribs anteromedially. Please see above for details. 2. No new lung mass or consolidation. No new or suspicious lung nodule. 3. No mediastinal or hilar lymphadenopathy by size criteria. 4. Multiple other nonacute observations, as described above. Aortic Atherosclerosis (ICD10-I70.0) and Emphysema (ICD10-J43.9). Electronically Signed   By: Ree Molt M.D.   On: 01/29/2024 10:00       IMPRESSION/PLAN: 1. Locally Progressive Stage IIB, cT3N0M0, NSCLC, Squamous Cell Carcinoma. Dr. Dewey discusses the pathology findings and reviews the nature of locally advanced lung cancers.  Unfortunately the patient has progressed through her systemic treatment.  Fortunately she is not having an increase in chest wall symptoms or difficulty with breathing.  Dr. Katragadda is considering changes to her regimen versus continuation of chemotherapy with adjustments in dose.  Dr. Dewey discusses that the patient would be a candidate for radiotherapy to either palliate or intentionally give definitive treatment for her disease.  The patient is interested in being aggressive and trying to get into remission, and after discussing the differences between intent and courses of radiotherapy, she is interested in chemoradiation.  Dr. Dewey is in agreement and would offer a course of 6-1/2 weeks of treatment with concurrent chemotherapy.  We discussed the risks, benefits, short, and long term effects of radiotherapy, the delivery and logistics, and  the patient is interested in proceeding.  We discussed simulation tomorrow with intentions of starting her treatment on 02/19/2024 when she is already scheduled to follow back up with Dr. Rogers.  We will coordinate next steps with our simulation department.  When she comes for simulation she would sign written consent to proceed.  This encounter was conducted via telephone.  The patient has provided two factor identification and has given verbal consent for this type of encounter and has been advised to only accept a meeting of this type in a secure network environment. The time spent during this encounter was 60 minutes including  preparation, discussion, and coordination of the patient's care. The attendants for this meeting include Sharene Cary, RN, Dr. Dewey, Denise Estefana Rivas  and Ronal CROME Gibeau as well as her niece Ashley Rizor, and niece Sheyanne Munley. During the encounter,  Sharene Cary, RN, Dr. Dewey, and Denise Estefana Rivas were located at [facility] Radiation Oncology Department.  Eugene L Seabury was located at home and her niece Ashley Peron, and niece Cynthia Tozer joined us  remotely by phone   The above documentation reflects my direct findings during this shared patient visit. Please see the separate note by Dr. Dewey on this date for the remainder of the patient's plan of care.    Denise KYM Rivas, [facility]   **Disclaimer: This note was dictated with voice recognition software. Similar sounding words can inadvertently be transcribed and this note may contain transcription errors which may not have been corrected upon publication of note.**

## 2024-02-09 ENCOUNTER — Ambulatory Visit: Admitting: Radiation Oncology

## 2024-02-09 NOTE — Addendum Note (Signed)
 Encounter addended by: Dewey Rush, MD on: 02/09/2024 7:35 AM  Actions taken: Clinical Note Signed

## 2024-02-12 ENCOUNTER — Ambulatory Visit
Admission: RE | Admit: 2024-02-12 | Discharge: 2024-02-12 | Disposition: A | Discharge status: Home / Self Care | Source: Ambulatory Visit | Attending: Radiation Oncology | Admitting: Radiation Oncology

## 2024-02-12 ENCOUNTER — Telehealth: Payer: Self-pay

## 2024-02-12 DIAGNOSIS — C3412 Malignant neoplasm of upper lobe, left bronchus or lung: Secondary | ICD-10-CM | POA: Insufficient documentation

## 2024-02-13 ENCOUNTER — Other Ambulatory Visit: Payer: Self-pay

## 2024-02-13 ENCOUNTER — Ambulatory Visit
Admission: RE | Admit: 2024-02-13 | Discharge: 2024-02-13 | Disposition: A | Discharge status: Home / Self Care | Source: Ambulatory Visit | Attending: Radiation Oncology | Admitting: Radiation Oncology

## 2024-02-13 DIAGNOSIS — C3412 Malignant neoplasm of upper lobe, left bronchus or lung: Secondary | ICD-10-CM | POA: Insufficient documentation

## 2024-02-18 DIAGNOSIS — Z87891 Personal history of nicotine dependence: Secondary | ICD-10-CM | POA: Insufficient documentation

## 2024-02-18 DIAGNOSIS — E876 Hypokalemia: Secondary | ICD-10-CM | POA: Diagnosis not present

## 2024-02-18 DIAGNOSIS — C3412 Malignant neoplasm of upper lobe, left bronchus or lung: Secondary | ICD-10-CM | POA: Insufficient documentation

## 2024-02-18 DIAGNOSIS — C7951 Secondary malignant neoplasm of bone: Secondary | ICD-10-CM | POA: Diagnosis not present

## 2024-02-18 DIAGNOSIS — Z79899 Other long term (current) drug therapy: Secondary | ICD-10-CM | POA: Diagnosis not present

## 2024-02-19 ENCOUNTER — Inpatient Hospital Stay: Admitting: Hematology

## 2024-02-19 ENCOUNTER — Other Ambulatory Visit: Payer: Self-pay

## 2024-02-19 ENCOUNTER — Inpatient Hospital Stay

## 2024-02-19 DIAGNOSIS — C3412 Malignant neoplasm of upper lobe, left bronchus or lung: Secondary | ICD-10-CM | POA: Diagnosis not present

## 2024-02-19 LAB — RAD ONC ARIA SESSION SUMMARY
Course Elapsed Days: 0
Plan Fractions Treated to Date: 1
Plan Prescribed Dose Per Fraction: 2 Gy
Plan Total Fractions Prescribed: 30
Plan Total Prescribed Dose: 60 Gy
Reference Point Dosage Given to Date: 2 Gy
Reference Point Session Dosage Given: 2 Gy
Session Number: 1

## 2024-02-19 NOTE — Progress Notes (Signed)
 START ON PATHWAY REGIMEN - Non-Small Cell Lung     A cycle is every 28 days:     Cisplatin       Etoposide    **Always confirm dose/schedule in your pharmacy ordering system**  Patient Characteristics: Preoperative or Nonsurgical Candidate (Clinical Staging), Stage IIB (N2a only) or Stage III - Nonsurgical Candidate, PS = 0,1 Therapeutic Status: Preoperative or Nonsurgical Candidate (Clinical Staging) AJCC T Category: cT4 AJCC N Category: cN0 AJCC M Category: cM0 AJCC 9 Stage Grouping: IIIA Check here if patient was staged using an edition other than AJCC Staging 9th Edition: true ECOG Performance Status: 1 Intent of Therapy: Curative Intent, Discussed with Patient

## 2024-02-19 NOTE — Progress Notes (Incomplete)
 [facility] [address] [place] [address]    Clinic Day:  02/19/2024  Referring physician: [name], FNP  Patient Care Team: [name], FNP as PCP - General (Family Medicine) Darlean Ozell NOVAK, MD as Consulting Physician (Pulmonary Disease)   ASSESSMENT & PLAN:   Assessment: 1.  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  Mass abuts anterior pleural surface.  There is mild cortical erosion along the inner aspect of the left anterior third rib abutting the left upper lobe mass consistent with direct invasion.  Indeterminate 5 mm subsolid LUL nodule too small to characterize. - Denies any fevers, night sweats or weight loss.  No hemoptysis. - She reports burning all over the body.  Also reports left chest wall pain, aching type preventing her to sleep on the left side.  She is taking Tylenol  as needed which helps. - Guardant360: PIK3CA E545K, PTEN deletion (exon 1), T p53, BLM - PET scan (09/01/2023): 7 cm anterior left upper lobe mass invading left chest wall with destruction of the left third anterior rib.  No adenopathy or distant metastatic disease.  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 - MRI brain (08/31/2022): No evidence of metastatic disease. - Left upper lobe biopsy (09/11/2023): Invasive moderately differentiated squamous cell carcinoma. - 4 cycles of neoadjuvant carboplatin , paclitaxel  and nivolumab  from 10/31/2023 through 01/02/2024   2. Social/Family History: -Lives at home alone and does not work. Tobacco use of 1 cigarette a day since her 20's. No chemical exposures.  -Mother had uterine cancer. 2 sisters had breast cancer.     Plan: 1.  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 - CT chest (6/16): interval increase in size of LUL mass 7.6x10.1cm, invading L upper anterior chest wall with destruction of left 2nd-4rth ribs. No new masses or asenopathy. - she was seen by Dr.Hendrickson. Not a surgical candidate. - Will refer to Radiation Oncology. Rtc 2 weeks. Discussed with her Niece over phone.   2.  Left chest wall pain: - Pain well controlled. Take percocet as needed.   3.  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  4.  Decreased appetite: - Continue megace  bid and ensure daily.  5.  Hypokalemia: - Continue K-Dur 20 mEq daily.  Potassium is normal.    No orders of the defined types were placed in this encounter.     [name],acting as a Neurosurgeon for [employer], MD.,have documented all relevant documentation on the behalf of Alean Stands, MD,as directed by  Alean Stands, MD while in the presence of Alean Stands, MD.  ***     Creston R Teague   7/7/20257:45 AM  CHIEF COMPLAINT:   Diagnosis: LUL squamous cell lung cancer   Cancer Staging  Primary squamous cell carcinoma of upper lobe of left lung [facility]) Staging form: Lung, AJCC V9 - Clinical stage from 09/20/2023: Stage IIB (cT3, cN0, cM0) - Unsigned    Prior Therapy: none  Current Therapy: Neoadjuvant chemoimmunotherapy   HISTORY OF PRESENT ILLNESS:   Oncology History  Primary squamous cell carcinoma of upper lobe  of left lung (HCC)  09/20/2023 Initial Diagnosis   Primary squamous cell carcinoma of upper lobe of left lung (HCC)   10/31/2023 -  Chemotherapy   Patient is on Treatment Plan : LUNG Opdivo  + Carboplatin  + Paclitaxel  q21d        INTERVAL HISTORY:   Denise Rivas is a 63 y.o. female presenting to clinic today for follow up of LUL squamous cell lung cancer. She was last seen by me on 02/05/24.  Today, she states that she is doing well  overall. Her appetite level is at ***%. Her energy level is at ***%.   PAST MEDICAL HISTORY:   Past Medical History: Past Medical History:  Diagnosis Date   Anxiety    Hypercholesteremia    Hypertension    Lung cancer (HCC)    Neurofibromatosis, type 1 [facility])    saw Dermatology [facility] 2023    Surgical History: Past Surgical History:  Procedure Laterality Date   ABDOMINAL HYSTERECTOMY     PORTACATH PLACEMENT Right 10/12/2023   Procedure: INSERTION PORT-A-CATH;  Surgeon: Kallie Manuelita BROCKS, MD;  Location: [facility];  Service: General;  Laterality: Right;    Social History: Social History   Socioeconomic History   Marital status: Single    Spouse name: Not on file   Number of children: Not on file   Years of education: Not on file   Highest education level: Not on file  Occupational History   Not on file  Tobacco Use   Smoking status: Former    Types: Cigarettes   Smokeless tobacco: Never  Vaping Use   Vaping status: Never Used  Substance and Sexual Activity   Alcohol use: Not Currently   Drug use: Never   Sexual activity: Not on file  Other Topics Concern   Not on file  Social History Narrative   Not on file   Social Drivers of Health   Financial Resource Strain: Not on file  Food Insecurity: Not on file  Transportation Needs: Not on file  Physical Activity: Not on file  Stress: Not on file  Social Connections: Unknown (06/14/2022)   [facility]   Social Network    Social Network: Not on file  Intimate Partner Violence: Unknown (06/14/2022)   Received from [facility]   HITS    Physically Hurt: Not on file    Insult or Talk Down To: Not on file    Threaten Physical Harm: Not on file    Scream or Curse: Not on file    Family History: No family history on file.  Current Medications:  Current Outpatient Medications:    amLODipine (NORVASC) 5 MG tablet, Take 5 mg by mouth in the morning., Disp: , Rfl:    aspirin EC 81 MG tablet,  Take 81 mg by mouth in the morning., Disp: , Rfl:    fluticasone (FLONASE) 50 MCG/ACT nasal spray, Place 1 spray into both nostrils daily as needed for allergies., Disp: , Rfl:    Fosaprepitant  Dimeglumine (EMEND IV), Inject into the vein., Disp: , Rfl:    gabapentin  (NEURONTIN ) 300 MG capsule, Take 1 capsule (300 mg total) by mouth See admin instructions. Take 1 capsule (300 mg) by mouth scheduled at bedtime, may take up to 2 additional dose if needed for pain., Disp: 90 capsule, Rfl: 11   hydrOXYzine (ATARAX) 25 MG tablet, Take 25 mg by mouth daily as needed for anxiety., Disp: , Rfl:    ibuprofen (ADVIL) 200 MG tablet, Take 400  mg by mouth every 8 (eight) hours as needed (pain)., Disp: , Rfl:    Lactulose  20 GM/30ML SOLN, Take 30 mLs (20 g total) by mouth daily as needed. Take 30 ml by mouth every 3 hours until you have a bowel movement, then use daily as needed for constipation, Disp: 450 mL, Rfl: 1   lidocaine -prilocaine  (EMLA ) cream, Apply 1 Application topically as needed., Disp: 30 g, Rfl: 0   megestrol  (MEGACE ) 40 MG/ML suspension, Take 10 mLs (400 mg total) by mouth 2 (two) times daily., Disp: 480 mL, Rfl: 2   mirtazapine  (REMERON  SOL-TAB) 45 MG disintegrating tablet, Take 45 mg by mouth at bedtime., Disp: , Rfl:    naproxen sodium (ALEVE) 220 MG tablet, Take 220-440 mg by mouth 2 (two) times daily as needed (pain.)., Disp: , Rfl:    ondansetron  (ZOFRAN ) 4 MG tablet, Take 1 tablet (4 mg total) by mouth every 8 (eight) hours as needed., Disp: 30 tablet, Rfl: 1   oxyCODONE -acetaminophen  (PERCOCET) 10-325 MG tablet, Take 1 tablet by mouth every 12 (twelve) hours as needed for pain., Disp: 60 tablet, Rfl: 0   Palonosetron  HCl (ALOXI  IV), Inject into the vein., Disp: , Rfl:    potassium chloride  SA (KLOR-CON  M) 20 MEQ tablet, Take 1 tablet (20 mEq total) by mouth daily., Disp: 30 tablet, Rfl: 5   pravastatin (PRAVACHOL) 40 MG tablet, Take 40 mg by mouth daily., Disp: , Rfl:    prochlorperazine   (COMPAZINE ) 10 MG tablet, Take 1 tablet (10 mg total) by mouth every 6 (six) hours as needed for nausea or vomiting., Disp: 60 tablet, Rfl: 2   sertraline (ZOLOFT) 100 MG tablet, Take 100 mg by mouth in the morning., Disp: , Rfl:    Allergies: No Known Allergies  REVIEW OF SYSTEMS:   Review of Systems  Constitutional:  Negative for chills, fatigue and fever.  HENT:   Negative for lump/mass, mouth sores, nosebleeds, sore throat and trouble swallowing.   Eyes:  Negative for eye problems.  Respiratory:  Negative for cough and shortness of breath.   Cardiovascular:  Negative for chest pain, leg swelling and palpitations.  Gastrointestinal:  Negative for abdominal pain, constipation, diarrhea, nausea and vomiting.  Genitourinary:  Negative for bladder incontinence, difficulty urinating, dysuria, frequency, hematuria and nocturia.   Musculoskeletal:  Negative for arthralgias, back pain, flank pain, myalgias and neck pain.  Skin:  Negative for itching and rash.  Neurological:  Negative for dizziness, headaches and numbness.  Hematological:  Does not bruise/bleed easily.  Psychiatric/Behavioral:  Negative for depression, sleep disturbance and suicidal ideas. The patient is not nervous/anxious.   All other systems reviewed and are negative.    VITALS:   There were no vitals taken for this visit.  Wt Readings from Last 3 Encounters:  02/08/24 129 lb (58.5 kg)  02/05/24 129 lb 10.1 oz (58.8 kg)  01/30/24 129 lb 11.2 oz (58.8 kg)    There is no height or weight on file to calculate BMI.  Performance status (ECOG): 1 - Symptomatic but completely ambulatory  PHYSICAL EXAM:   Physical Exam Vitals and nursing note reviewed. Exam conducted with a chaperone present.  Constitutional:      Appearance: Normal appearance.  Cardiovascular:     Rate and Rhythm: Normal rate and regular rhythm.     Pulses: Normal pulses.     Heart sounds: Normal heart sounds.  Pulmonary:     Effort: Pulmonary  effort is normal.     Breath sounds: Normal  breath sounds.  Abdominal:     Palpations: Abdomen is soft. There is no hepatomegaly, splenomegaly or mass.     Tenderness: There is no abdominal tenderness.  Musculoskeletal:     Right lower leg: No edema.     Left lower leg: No edema.  Lymphadenopathy:     Cervical: No cervical adenopathy.     Right cervical: No superficial, deep or posterior cervical adenopathy.    Left cervical: No superficial, deep or posterior cervical adenopathy.     Upper Body:     Right upper body: No supraclavicular or axillary adenopathy.     Left upper body: No supraclavicular or axillary adenopathy.  Neurological:     General: No focal deficit present.     Mental Status: She is alert and oriented to person, place, and time.  Psychiatric:        Mood and Affect: Mood normal.        Behavior: Behavior normal.     LABS:   CBC     Component Value Date/Time   WBC 11.4 (H) 01/02/2024 0824   RBC 3.41 (L) 01/02/2024 0824   HGB 11.4 (L) 01/02/2024 0824   HCT 34.5 (L) 01/02/2024 0824   PLT 343 01/02/2024 0824   MCV 101.2 (H) 01/02/2024 0824   MCH 33.4 01/02/2024 0824   MCHC 33.0 01/02/2024 0824   RDW 22.7 (H) 01/02/2024 0824   LYMPHSABS 1.9 01/02/2024 0824   MONOABS 1.6 (H) 01/02/2024 0824   EOSABS 0.0 01/02/2024 0824   BASOSABS 0.1 01/02/2024 0824    CMP      Component Value Date/Time   NA 134 (L) 01/02/2024 0824   K 3.5 01/02/2024 0824   CL 106 01/02/2024 0824   CO2 19 (L) 01/02/2024 0824   GLUCOSE 89 01/02/2024 0824   BUN 14 01/02/2024 0824   CREATININE 0.48 01/02/2024 0824   CALCIUM 9.0 01/02/2024 0824   PROT 7.2 01/02/2024 0824   ALBUMIN 3.3 (L) 01/02/2024 0824   AST 15 01/02/2024 0824   ALT 17 01/02/2024 0824   ALKPHOS 71 01/02/2024 0824   BILITOT 0.4 01/02/2024 0824   GFRNONAA >60 01/02/2024 0824   GFRAA >60 02/18/2020 0932     No results found for: CEA1, CEA / No results found for: CEA1, CEA No results found for:  PSA1 No results found for: CAN199 No results found for: CAN125  No results found for: TOTALPROTELP, ALBUMINELP, A1GS, A2GS, BETS, BETA2SER, GAMS, MSPIKE, SPEI No results found for: TIBC, FERRITIN, IRONPCTSAT No results found for: LDH   STUDIES:   CT CHEST W CONTRAST Result Date: 01/29/2024 CLINICAL DATA:  Non-small cell lung cancer (NSCLC), non-metastatic, assess treatment response. * Tracking Code: BO * EXAM: CT CHEST WITH CONTRAST TECHNIQUE: Multidetector CT imaging of the chest was performed during intravenous contrast administration. RADIATION DOSE REDUCTION: This exam was performed according to the departmental dose-optimization program which includes automated exposure control, adjustment of the mA and/or kV according to patient size and/or use of iterative reconstruction technique. CONTRAST:  75mL OMNIPAQUE  IOHEXOL  300 MG/ML  SOLN COMPARISON:  CT scan chest from 07/23/2023. FINDINGS: Cardiovascular: Normal cardiac size. No pericardial effusion. No aortic aneurysm. There are moderate peripheral atherosclerotic vascular calcifications of thoracic aorta and its major branches. Mediastinum/Nodes: Visualized thyroid  gland appears grossly unremarkable. No solid / cystic mediastinal masses. The esophagus is nondistended precluding optimal assessment. There are few mildly prominent mediastinal and hilar lymph nodes, which do not meet the size criteria for lymphadenopathy and appear grossly similar  to the prior study, favoring benign etiology. No axillary lymphadenopathy by size criteria. Lungs/Pleura: The central tracheo-bronchial tree is patent. There mild-to-moderate upper lobe predominant emphysematous changes. There is continued interval increase in the large heterogeneous mass centered in the left upper lobe, anteriorly currently measuring 7.6 x 10.1 cm. The mass is invading the left upper anterior chest wall and there is destruction of left second through fourth ribs  anteromedial ance. There is also loss of fat planes between the mass and the left pectoralis major muscle as well as intercostal muscles. There is small area of air within the mass, suggesting central cavitation/necrosis. The mass also abuts the prevascular fat however, there is minimal fat plane preserved between the mass and the aortic arch. Anteriorly the mass also is in close proximity to the left-sided internal mammary vessels without occlusion. No new mass or consolidation. No pleural effusion or pneumothorax. Redemonstration of stable, sub 6 mm calcified and noncalcified nodules, 1 each in bilateral lungs, in the left lung - upper lobe (series 2005, image 37), and in the right lung - middle lobe series 2005, image 95). No new or suspicious lung nodule. Upper Abdomen: Surgically absent gallbladder. There is a simple cyst in the right kidney upper pole measuring up to 1.9 x 2.0 cm. Remaining visualized upper abdominal viscera within normal limits. Musculoskeletal: A CT Port-a-Cath is seen in the right upper chest wall with the catheter terminating in the lower portion of superior vena cava. Visualized soft tissues of the chest wall are otherwise grossly unremarkable. No suspicious osseous lesions. There are mild multilevel degenerative changes in the visualized spine. IMPRESSION: 1. Continued interval increase in the size of large heterogeneous mass centered in the left upper lobe, currently measuring 7.6 x 10.1 cm. The mass is invading the left upper anterior chest wall with destruction of left second through fourth ribs anteromedially. Please see above for details. 2. No new lung mass or consolidation. No new or suspicious lung nodule. 3. No mediastinal or hilar lymphadenopathy by size criteria. 4. Multiple other nonacute observations, as described above. Aortic Atherosclerosis (ICD10-I70.0) and Emphysema (ICD10-J43.9). Electronically Signed   By: Ree Molt M.D.   On: 01/29/2024 10:00

## 2024-02-19 NOTE — Progress Notes (Signed)
 DISCONTINUE ON PATHWAY REGIMEN - Non-Small Cell Lung     A cycle is every 21 days:     Nivolumab       Paclitaxel       Carboplatin    **Always confirm dose/schedule in your pharmacy ordering system**  PRIOR TREATMENT: [id number]: Carboplatin  AUC=6 + Paclitaxel  200 mg/m2 + Nivolumab  360 mg q21 Days x up to 4 Cycles    Patient Characteristics: Preoperative or Nonsurgical Candidate (Clinical Staging) Therapeutic Status: Preoperative or Nonsurgical Candidate (Clinical Staging) AJCC T Category: cT4 AJCC N Category: cN0 AJCC M Category: cM0 AJCC 9 Stage Grouping: IIIA Check here if patient was staged using an edition other than AJCC Staging 9th Edition: true

## 2024-02-20 ENCOUNTER — Inpatient Hospital Stay

## 2024-02-20 ENCOUNTER — Other Ambulatory Visit: Payer: Self-pay

## 2024-02-20 ENCOUNTER — Inpatient Hospital Stay: Admitting: Hematology

## 2024-02-20 ENCOUNTER — Ambulatory Visit
Admission: RE | Admit: 2024-02-20 | Discharge: 2024-02-20 | Disposition: A | Discharge status: Home / Self Care | Source: Ambulatory Visit | Attending: Radiation Oncology

## 2024-02-20 ENCOUNTER — Inpatient Hospital Stay: Attending: Hematology

## 2024-02-20 VITALS — BP 135/75 | HR 92 | Temp 98.8°F | Resp 16

## 2024-02-20 VITALS — Ht 61.0 in | Wt 127.9 lb

## 2024-02-20 DIAGNOSIS — C3412 Malignant neoplasm of upper lobe, left bronchus or lung: Secondary | ICD-10-CM

## 2024-02-20 DIAGNOSIS — K118 Other diseases of salivary glands: Secondary | ICD-10-CM

## 2024-02-20 DIAGNOSIS — R918 Other nonspecific abnormal finding of lung field: Secondary | ICD-10-CM

## 2024-02-20 LAB — RAD ONC ARIA SESSION SUMMARY
Course Elapsed Days: 1
Plan Fractions Treated to Date: 2
Plan Prescribed Dose Per Fraction: 2 Gy
Plan Total Fractions Prescribed: 30
Plan Total Prescribed Dose: 60 Gy
Reference Point Dosage Given to Date: 4 Gy
Reference Point Session Dosage Given: 2 Gy
Session Number: 2

## 2024-02-20 LAB — CBC WITH DIFFERENTIAL/PLATELET
Abs Immature Granulocytes: 0.06 K/uL (ref 0.00–0.07)
Basophils Absolute: 0.1 K/uL (ref 0.0–0.1)
Basophils Relative: 1 %
Eosinophils Absolute: 0.1 K/uL (ref 0.0–0.5)
Eosinophils Relative: 1 %
HCT: 35.7 % — ABNORMAL LOW (ref 36.0–46.0)
Hemoglobin: 11.7 g/dL — ABNORMAL LOW (ref 12.0–15.0)
Immature Granulocytes: 1 %
Lymphocytes Relative: 10 %
Lymphs Abs: 1.1 K/uL (ref 0.7–4.0)
MCH: 33.8 pg (ref 26.0–34.0)
MCHC: 32.8 g/dL (ref 30.0–36.0)
MCV: 103.2 fL — ABNORMAL HIGH (ref 80.0–100.0)
Monocytes Absolute: 1.8 K/uL — ABNORMAL HIGH (ref 0.1–1.0)
Monocytes Relative: 17 %
Neutro Abs: 8 K/uL — ABNORMAL HIGH (ref 1.7–7.7)
Neutrophils Relative %: 70 %
Platelets: 381 K/uL (ref 150–400)
RBC: 3.46 MIL/uL — ABNORMAL LOW (ref 3.87–5.11)
RDW: 12.8 % (ref 11.5–15.5)
WBC: 11 K/uL — ABNORMAL HIGH (ref 4.0–10.5)
nRBC: 0 % (ref 0.0–0.2)

## 2024-02-20 LAB — COMPREHENSIVE METABOLIC PANEL WITH GFR
ALT: 19 U/L (ref 0–44)
AST: 13 U/L — ABNORMAL LOW (ref 15–41)
Albumin: 3.2 g/dL — ABNORMAL LOW (ref 3.5–5.0)
Alkaline Phosphatase: 60 U/L (ref 38–126)
Anion gap: 8 (ref 5–15)
BUN: 13 mg/dL (ref 8–23)
CO2: 22 mmol/L (ref 22–32)
Calcium: 9.8 mg/dL (ref 8.9–10.3)
Chloride: 108 mmol/L (ref 98–111)
Creatinine, Ser: 0.57 mg/dL (ref 0.44–1.00)
GFR, Estimated: >60 mL/min (ref >60)
Glucose, Bld: 113 mg/dL — ABNORMAL HIGH (ref 70–99)
Potassium: 3.1 mmol/L — ABNORMAL LOW (ref 3.5–5.1)
Sodium: 138 mmol/L (ref 135–145)
Total Bilirubin: 0.5 mg/dL (ref 0.0–1.2)
Total Protein: 7.4 g/dL (ref 6.5–8.1)

## 2024-02-20 LAB — MAGNESIUM: Magnesium: 1.8 mg/dL (ref 1.7–2.4)

## 2024-02-20 MED ORDER — PALONOSETRON HCL INJECTION 0.25 MG/5ML
0.2500 mg | Freq: Once | INTRAVENOUS | Status: AC
  Administered 2024-02-20: 0.25 mg via INTRAVENOUS
  Filled 2024-02-20: qty 5

## 2024-02-20 MED ORDER — SODIUM CHLORIDE 0.9 % IV SOLN
50.0000 mg/m2 | Freq: Once | INTRAVENOUS | Status: AC
  Administered 2024-02-20: 80 mg via INTRAVENOUS
  Filled 2024-02-20: qty 4

## 2024-02-20 MED ORDER — SODIUM CHLORIDE 0.9 % IV SOLN: INTRAVENOUS | Status: DC

## 2024-02-20 MED ORDER — DEXAMETHASONE SODIUM PHOSPHATE 10 MG/ML IJ SOLN
10.0000 mg | Freq: Once | INTRAMUSCULAR | Status: AC
  Administered 2024-02-20: 10 mg via INTRAVENOUS
  Filled 2024-02-20: qty 1

## 2024-02-20 MED ORDER — SODIUM CHLORIDE 0.9% FLUSH
10.0000 mL | INTRAVENOUS | Status: AC
  Administered 2024-02-20: 10 mL

## 2024-02-20 MED ORDER — HEPARIN SOD (PORK) LOCK FLUSH 100 UNIT/ML IV SOLN
500.0000 [IU] | Freq: Once | INTRAVENOUS | Status: AC | PRN
  Administered 2024-02-20: 500 [IU]

## 2024-02-20 MED ORDER — SODIUM CHLORIDE 0.9 % IV SOLN: Freq: Once | INTRAVENOUS | Status: DC

## 2024-02-20 MED ORDER — SODIUM CHLORIDE 0.9% FLUSH
10.0000 mL | INTRAVENOUS | Status: DC | PRN
  Administered 2024-02-20: 10 mL

## 2024-02-20 NOTE — Progress Notes (Signed)
 Patient presents today for D1C1 Cisplatin /Etoposide  per providers order.  Vital signs and labs within parameters for treatment.  Patient has no new complaints att his time. Per MD patient will only receive Etoposide  today and will receive Cisplatin  and Etoposide  tomorrow. Treatment given today per MD orders.  Stable during infusion without adverse affects.  Vital signs stable.  No complaints at this time.  Discharge from clinic ambulatory in stable condition.  Alert and oriented X 3.  Follow up with [facility] as scheduled.

## 2024-02-20 NOTE — Progress Notes (Signed)
 Pharmacist Chemotherapy Monitoring - Initial Assessment    Anticipated start date: 02/20/24   The following has been reviewed per standard work regarding the patient's treatment regimen: The patient's diagnosis, treatment plan and drug doses, and organ/hematologic function Lab orders and baseline tests specific to treatment regimen  The treatment plan start date, drug sequencing, and pre-medications Prior authorization status  Patient's documented medication list, including drug-drug interaction screen and prescriptions for anti-emetics and supportive care specific to the treatment regimen The drug concentrations, fluid compatibility, administration routes, and timing of the medications to be used The patient's access for treatment and lifetime cumulative dose history, if applicable  The patient's medication allergies and previous infusion related reactions, if applicable   Changes made to treatment plan:  N/A  Follow up needed:  N/A   Denise Rivas, [facility], 02/20/2024  7:54 AM

## 2024-02-20 NOTE — Patient Instructions (Signed)
 [facility] at [facility] Discharge Instructions   You were seen and examined today by Dr. Rogers.  He reviewed the results of your lab work which are normal/stable.   We will proceed with your treatment today. We are switching your treatment to two chemotherapy drugs called cisplatin  and etoposide . The cisplatin  is given weekly 2 weeks in a row. The etoposide  is given 5 days in a row every the first week of treatment. This is repeated every 4 weeks.   Return as scheduled.    Thank you for choosing [facility] at [facility] to provide your oncology and hematology care.  To afford each patient quality time with our provider, please arrive at least 15 minutes before your scheduled appointment time.   If you have a lab appointment with the Cancer Center please come in thru the Main Entrance and check in at the main information desk.  You need to re-schedule your appointment should you arrive 10 or more minutes late.  We strive to give you quality time with our providers, and arriving late affects you and other patients whose appointments are after yours.  Also, if you no show three or more times for appointments you may be dismissed from the clinic at the providers discretion.     Again, thank you for choosing [facility].  Our hope is that these requests will decrease the amount of time that you wait before being seen by our physicians.       _____________________________________________________________  Should you have questions after your visit to [facility], please contact our office at [phone] and follow the prompts.  Our office hours are 8:00 a.m. and 4:30 p.m. Monday - Friday.  Please note that voicemails left after 4:00 p.m. may not be returned until the following business day.  We are closed weekends and major holidays.  You do have access to a nurse 24-7, just call the main number to the clinic  [phone] and do not press any options, hold on the line and a nurse will answer the phone.    For prescription refill requests, have your pharmacy contact our office and allow 72 hours.    Due to Covid, you will need to wear a mask upon entering the hospital. If you do not have a mask, a mask will be given to you at the Main Entrance upon arrival. For doctor visits, patients may have 1 support person age 34 or older with them. For treatment visits, patients can not have anyone with them due to social distancing guidelines and our immunocompromised population.

## 2024-02-20 NOTE — Progress Notes (Signed)
 [name] [address], [place] [address]    Clinic Day:  02/20/2024  Referring physician: [name], FNP  Patient Care Team: [name], FNP as PCP - General (Family Medicine) Denise Rivas Denise NOVAK, MD as Consulting Physician (Pulmonary Disease)   ASSESSMENT & PLAN:   Assessment: 1.  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  Mass abuts anterior pleural surface.  There is mild cortical erosion along the inner aspect of the left anterior third rib abutting the left upper lobe mass consistent with direct invasion.  Indeterminate 5 mm subsolid LUL nodule too small to characterize. - Denies any fevers, night sweats or weight loss.  No hemoptysis. - She reports burning all over the body.  Also reports left chest wall pain, aching type preventing her to sleep on the left side.  She is taking Tylenol  as needed which helps. - Guardant360: PIK3CA [id number], PTEN deletion (exon 1), T p53, BLM - PET scan (09/01/2023): 7 cm anterior left upper lobe mass invading left chest wall with destruction of the left third anterior rib.  No adenopathy or distant metastatic disease.  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 - MRI brain (08/31/2022): No evidence of metastatic disease. - Left upper lobe biopsy (09/11/2023): Invasive moderately differentiated squamous cell carcinoma. - 4 cycles of neoadjuvant carboplatin , paclitaxel  and nivolumab  from 10/31/2023 through 01/02/2024 - CT chest (01/28/2021): Interval increase in size of the LUL mass 7.6 x 10.1 cm, invading left upper chest wall with destruction of the left 2nd-4th ribs.  No new masses or adenopathy. - She was seen by Dr. Kerrin and felt to be not a surgical candidate. - Combination  chemoradiation therapy started on 02/19/2024, chemotherapy with cisplatin  and etoposide  started on 02/20/2024   2. Social/Family History: -Lives at home alone and does not work. Tobacco use of 1 cigarette a day since her 20's. No chemical exposures.  -Mother had uterine cancer. 2 sisters had breast cancer.     Plan: 1.  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 - She has started radiation therapy on 02/19/2024. - We discussed concurrent chemotherapy.  I would like to avoid carboplatin  and Taxol  as her tumor has increased in size with neoadjuvant chemotherapy using those 2 agents.  Hence I have recommended cisplatin  and etoposide  as radiation sensitizing agents. - We discussed side effects in detail.  She will proceed with cycle 1 day 1 of treatment today.  She will receive only 4 days of VP-16 this week.  RTC on day 8.   2.  Left chest wall pain: - She has occasional left anterior chest wall pain mostly at nighttime. - Continue Percocet 10/325 at bedtime as needed.   3.  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  But they are put on back burner due to recent lung cancer progression.  4.  Decreased appetite: - Continue Megace  twice daily and Ensure daily.  Weight is stable.  5.  Hypokalemia: - Continue K-Dur 20 milliequivalents daily.  Potassium today is 3.1.    Orders Placed This Encounter  Procedures   CBC with Differential    Standing Status:   Future    Number of Occurrences:   1    Expected Date:   02/20/2024    Expiration Date:   02/19/2025   Comprehensive metabolic panel    Standing Status:   Future    Number of Occurrences:   1    Expected Date:   02/20/2024    Expiration Date:   02/19/2025   CBC with Differential    Standing Status:   Future    Expected Date:   02/28/2024    Expiration Date:   02/27/2025    Comprehensive metabolic panel    Standing Status:   Future    Expected Date:   02/28/2024    Expiration Date:   02/27/2025   CBC with Differential    Standing Status:   Future    Expected Date:   03/20/2024    Expiration Date:   03/20/2025   Comprehensive metabolic panel    Standing Status:   Future    Expected Date:   03/20/2024    Expiration Date:   03/20/2025   CBC with Differential    Standing Status:   Future    Expected Date:   03/27/2024    Expiration Date:   03/27/2025   Comprehensive metabolic panel    Standing Status:   Future    Expected Date:   03/27/2024    Expiration Date:   03/27/2025      Denise Rivas,acting as a scribe for Alean Stands, MD.,have documented all relevant documentation on the behalf of Alean Stands, MD,as directed by  Alean Stands, MD while in the presence of Alean Stands, MD.  I, Alean Stands MD, have reviewed the above documentation for accuracy and completeness, and I agree with the above.     Alean Stands, MD   7/8/20254:24 PM  CHIEF COMPLAINT:   Diagnosis: LUL squamous cell lung cancer   Cancer Staging  Primary squamous cell carcinoma of upper lobe of left lung [facility]) Staging form: Lung, AJCC V9 - Clinical stage from 09/20/2023: Stage IIB (cT3, cN0, cM0) - Unsigned    Prior Therapy: Neoadjuvant chemoimmunotherapy  Current Therapy: Concurrent chemoradiation therapy   HISTORY OF PRESENT ILLNESS:   Oncology History  Primary squamous cell carcinoma of upper lobe of left lung (HCC)  09/20/2023 Initial Diagnosis   Primary squamous cell carcinoma of upper lobe of left lung (HCC)   10/31/2023 - 01/04/2024 Chemotherapy   Patient is on Treatment Plan : LUNG Opdivo  + Carboplatin  + Paclitaxel  q21d     02/20/2024 -  Chemotherapy   Patient is on Treatment Plan : LUNG Cisplatin  (50) D1,8 + Etoposide  (50) D1-5 q28d + XRT        INTERVAL HISTORY:   Denise Rivas is a 63 y.o. female presenting to clinic today for  follow up of LUL squamous cell lung cancer. She was last seen by me on 02/05/24.  Today, she states that she is doing well overall. Her appetite level is at 100%. Her energy level is at 75%. Denise Rivas started radiation yesterday. She denies any side effects from chemotherapy. She reports left anterior chest wall and left axillary pain, particularly at night. She is taking Percocet at night and does not believe it is effective. Denise Rivas states Percocet causes drowsiness.   She is taking Megace  and magnesium  as prescribed. She notes a healthy appetite.   Denise Rivas states her sister would like a copy of her medical records. H  She reports her sister lives in [place] .  PAST MEDICAL HISTORY:   Past Medical History: Past Medical History:  Diagnosis Date   Anxiety    Hypercholesteremia    Hypertension    Lung cancer (HCC)    Neurofibromatosis, type 1 [facility])    saw Dermatology [facility] 2023    Surgical History: Past Surgical History:  Procedure Laterality Date   ABDOMINAL HYSTERECTOMY     PORTACATH PLACEMENT Right 10/12/2023   Procedure: INSERTION PORT-A-CATH;  Surgeon: Kallie Manuelita BROCKS, MD;  Location: [facility];  Service: General;  Laterality: Right;    Social History: Social History   Socioeconomic History   Marital status: Single    Spouse name: Not on file   Number of children: Not on file   Years of education: Not on file   Highest education level: Not on file  Occupational History   Not on file  Tobacco Use   Smoking status: Former    Types: Cigarettes   Smokeless tobacco: Never  Vaping Use   Vaping status: Never Used  Substance and Sexual Activity   Alcohol use: Not Currently   Drug use: Never   Sexual activity: Not on file  Other Topics Concern   Not on file  Social History Narrative   Not on file   Social Drivers of Health   Financial Resource Strain: Not on file  Food Insecurity: Not on file  Transportation Needs: Not on file  Physical Activity: Not on file  Stress:  Not on file  Social Connections: Unknown (06/14/2022)   Received from [facility]   Social Network    Social Network: Not on file  Intimate Partner Violence: Unknown (06/14/2022)   Received from [facility]   HITS    Physically Hurt: Not on file    Insult or Talk Down To: Not on file    Threaten Physical Harm: Not on file    Scream or Curse: Not on file    Family History: No family history on file.  Current Medications:  Current Outpatient Medications:    amLODipine (NORVASC) 5 MG tablet, Take 5 mg by mouth in the morning., Disp: , Rfl:    aspirin EC 81 MG tablet, Take 81 mg by mouth in the morning., Disp: , Rfl:    fluticasone (FLONASE) 50 MCG/ACT nasal spray, Place 1 spray into both nostrils daily as needed for allergies., Disp: , Rfl:    Fosaprepitant  Dimeglumine (EMEND IV), Inject into the vein., Disp: , Rfl:    gabapentin  (NEURONTIN ) 300 MG capsule, Take 1 capsule (300 mg total) by mouth See admin instructions. Take 1 capsule (300 mg) by mouth scheduled at bedtime, may take up to 2 additional dose if needed for pain., Disp: 90 capsule, Rfl: 11   hydrOXYzine (ATARAX) 25 MG tablet, Take 25 mg by mouth daily as needed for anxiety., Disp: , Rfl:    ibuprofen (ADVIL) 200 MG tablet, Take 400 mg by mouth every 8 (eight) hours as needed (pain)., Disp: , Rfl:    Lactulose  20 GM/30ML SOLN, Take 30 mLs (20 g total) by mouth daily as needed. Take 30 ml by mouth every 3 hours until you have a bowel movement, then use daily as needed for constipation, Disp: 450 mL, Rfl: 1   lidocaine -prilocaine  (EMLA ) cream, Apply 1 Application topically as needed., Disp: 30 g, Rfl: 0   megestrol  (MEGACE ) 40 MG/ML suspension, Take 10 mLs (400 mg total) by mouth 2 (two) times daily., Disp: 480 mL, Rfl: 2  mirtazapine  (REMERON  SOL-TAB) 45 MG disintegrating tablet, Take 45 mg by mouth at bedtime., Disp: , Rfl:    naproxen sodium (ALEVE) 220 MG tablet, Take 220-440 mg by mouth 2 (two) times daily as needed  (pain.)., Disp: , Rfl:    ondansetron  (ZOFRAN ) 4 MG tablet, Take 1 tablet (4 mg total) by mouth every 8 (eight) hours as needed., Disp: 30 tablet, Rfl: 1   oxyCODONE -acetaminophen  (PERCOCET) 10-325 MG tablet, Take 1 tablet by mouth every 12 (twelve) hours as needed for pain., Disp: 60 tablet, Rfl: 0   Palonosetron  HCl (ALOXI  IV), Inject into the vein., Disp: , Rfl:    potassium chloride  SA (KLOR-CON  M) 20 MEQ tablet, Take 1 tablet (20 mEq total) by mouth daily., Disp: 30 tablet, Rfl: 5   pravastatin (PRAVACHOL) 40 MG tablet, Take 40 mg by mouth daily., Disp: , Rfl:    sertraline (ZOLOFT) 100 MG tablet, Take 100 mg by mouth in the morning., Disp: , Rfl:    Allergies: No Known Allergies  REVIEW OF SYSTEMS:   Review of Systems  Constitutional:  Negative for chills, fatigue and fever.  HENT:   Negative for lump/mass, mouth sores, nosebleeds, sore throat and trouble swallowing.   Eyes:  Negative for eye problems.  Respiratory:  Negative for cough and shortness of breath.   Cardiovascular:  Negative for chest pain, leg swelling and palpitations.  Gastrointestinal:  Negative for abdominal pain, constipation, diarrhea, nausea and vomiting.  Genitourinary:  Negative for bladder incontinence, difficulty urinating, dysuria, frequency, hematuria and nocturia.   Musculoskeletal:  Negative for arthralgias, back pain, flank pain, myalgias and neck pain.  Skin:  Negative for itching and rash.  Neurological:  Negative for dizziness, headaches and numbness.  Hematological:  Does not bruise/bleed easily.  Psychiatric/Behavioral:  Negative for depression, sleep disturbance and suicidal ideas. The patient is not nervous/anxious.   All other systems reviewed and are negative.    VITALS:   Height 5' 1 (1.549 m), weight 127 lb 13.9 oz (58 kg).  Wt Readings from Last 3 Encounters:  02/20/24 127 lb 13.9 oz (58 kg)  02/08/24 129 lb (58.5 kg)  02/05/24 129 lb 10.1 oz (58.8 kg)    Body mass index is 24.16  kg/m.  Performance status (ECOG): 1 - Symptomatic but completely ambulatory  PHYSICAL EXAM:   Physical Exam Vitals and nursing note reviewed. Exam conducted with a chaperone present.  Constitutional:      Appearance: Normal appearance.  Cardiovascular:     Rate and Rhythm: Normal rate and regular rhythm.     Pulses: Normal pulses.     Heart sounds: Normal heart sounds.  Pulmonary:     Effort: Pulmonary effort is normal.     Breath sounds: Normal breath sounds.  Abdominal:     Palpations: Abdomen is soft. There is no hepatomegaly, splenomegaly or mass.     Tenderness: There is no abdominal tenderness.  Musculoskeletal:     Right lower leg: No edema.     Left lower leg: No edema.  Lymphadenopathy:     Cervical: No cervical adenopathy.     Right cervical: No superficial, deep or posterior cervical adenopathy.    Left cervical: No superficial, deep or posterior cervical adenopathy.     Upper Body:     Right upper body: No supraclavicular or axillary adenopathy.     Left upper body: No supraclavicular or axillary adenopathy.  Neurological:     General: No focal deficit present.     Mental  Status: She is alert and oriented to person, place, and time.  Psychiatric:        Mood and Affect: Mood normal.        Behavior: Behavior normal.     LABS:   CBC     Component Value Date/Time   WBC 11.0 (H) 02/20/2024 0850   RBC 3.46 (L) 02/20/2024 0850   HGB 11.7 (L) 02/20/2024 0850   HCT 35.7 (L) 02/20/2024 0850   PLT 381 02/20/2024 0850   MCV 103.2 (H) 02/20/2024 0850   MCH 33.8 02/20/2024 0850   MCHC 32.8 02/20/2024 0850   RDW 12.8 02/20/2024 0850   LYMPHSABS 1.1 02/20/2024 0850   MONOABS 1.8 (H) 02/20/2024 0850   EOSABS 0.1 02/20/2024 0850   BASOSABS 0.1 02/20/2024 0850    CMP      Component Value Date/Time   NA 138 02/20/2024 0850   K 3.1 (L) 02/20/2024 0850   CL 108 02/20/2024 0850   CO2 22 02/20/2024 0850   GLUCOSE 113 (H) 02/20/2024 0850   BUN 13 02/20/2024  0850   CREATININE 0.57 02/20/2024 0850   CALCIUM 9.8 02/20/2024 0850   PROT 7.4 02/20/2024 0850   ALBUMIN 3.2 (L) 02/20/2024 0850   AST 13 (L) 02/20/2024 0850   ALT 19 02/20/2024 0850   ALKPHOS 60 02/20/2024 0850   BILITOT 0.5 02/20/2024 0850   GFRNONAA >60 02/20/2024 0850   GFRAA >60 02/18/2020 0932     No results found for: CEA1, CEA / No results found for: CEA1, CEA No results found for: PSA1 No results found for: CAN199 No results found for: CAN125  No results found for: TOTALPROTELP, ALBUMINELP, A1GS, A2GS, BETS, BETA2SER, GAMS, MSPIKE, SPEI No results found for: TIBC, FERRITIN, IRONPCTSAT No results found for: LDH   STUDIES:   CT CHEST W CONTRAST Result Date: 01/29/2024 CLINICAL DATA:  Non-small cell lung cancer (NSCLC), non-metastatic, assess treatment response. * Tracking Code: BO * EXAM: CT CHEST WITH CONTRAST TECHNIQUE: Multidetector CT imaging of the chest was performed during intravenous contrast administration. RADIATION DOSE REDUCTION: This exam was performed according to the departmental dose-optimization program which includes automated exposure control, adjustment of the mA and/or kV according to patient size and/or use of iterative reconstruction technique. CONTRAST:  75mL OMNIPAQUE  IOHEXOL  300 MG/ML  SOLN COMPARISON:  CT scan chest from 07/23/2023. FINDINGS: Cardiovascular: Normal cardiac size. No pericardial effusion. No aortic aneurysm. There are moderate peripheral atherosclerotic vascular calcifications of thoracic aorta and its major branches. Mediastinum/Nodes: Visualized thyroid  gland appears grossly unremarkable. No solid / cystic mediastinal masses. The esophagus is nondistended precluding optimal assessment. There are few mildly prominent mediastinal and hilar lymph nodes, which do not meet the size criteria for lymphadenopathy and appear grossly similar to the prior study, favoring benign etiology. No axillary  lymphadenopathy by size criteria. Lungs/Pleura: The central tracheo-bronchial tree is patent. There mild-to-moderate upper lobe predominant emphysematous changes. There is continued interval increase in the large heterogeneous mass centered in the left upper lobe, anteriorly currently measuring 7.6 x 10.1 cm. The mass is invading the left upper anterior chest wall and there is destruction of left second through fourth ribs anteromedial ance. There is also loss of fat planes between the mass and the left pectoralis major muscle as well as intercostal muscles. There is small area of air within the mass, suggesting central cavitation/necrosis. The mass also abuts the prevascular fat however, there is minimal fat plane preserved between the mass and the aortic arch. Anteriorly the mass  also is in close proximity to the left-sided internal mammary vessels without occlusion. No new mass or consolidation. No pleural effusion or pneumothorax. Redemonstration of stable, sub 6 mm calcified and noncalcified nodules, 1 each in bilateral lungs, in the left lung - upper lobe (series 2005, image 37), and in the right lung - middle lobe series 2005, image 95). No new or suspicious lung nodule. Upper Abdomen: Surgically absent gallbladder. There is a simple cyst in the right kidney upper pole measuring up to 1.9 x 2.0 cm. Remaining visualized upper abdominal viscera within normal limits. Musculoskeletal: A CT Port-a-Cath is seen in the right upper chest wall with the catheter terminating in the lower portion of superior vena cava. Visualized soft tissues of the chest wall are otherwise grossly unremarkable. No suspicious osseous lesions. There are mild multilevel degenerative changes in the visualized spine. IMPRESSION: 1. Continued interval increase in the size of large heterogeneous mass centered in the left upper lobe, currently measuring 7.6 x 10.1 cm. The mass is invading the left upper anterior chest wall with destruction of  left second through fourth ribs anteromedially. Please see above for details. 2. No new lung mass or consolidation. No new or suspicious lung nodule. 3. No mediastinal or hilar lymphadenopathy by size criteria. 4. Multiple other nonacute observations, as described above. Aortic Atherosclerosis (ICD10-I70.0) and Emphysema (ICD10-J43.9). Electronically Signed   By: Ree Molt M.D.   On: 01/29/2024 10:00

## 2024-02-20 NOTE — Progress Notes (Signed)
   Plan updated to move cisplatin  to tomorrow with appropriate premeds and hydration.  Confirmed with Dr Rogers.  OK to proceed with HR 103 per Dr Rogers.  V.O. Dr Theadore Molt, PharmD

## 2024-02-20 NOTE — Patient Instructions (Signed)
 [facility] [place] - A DEPT OF [facility]  Discharge Instructions: Thank you for choosing [facility] to provide your oncology and hematology care.  If you have a lab appointment with the Cancer Center - please note that after April 8th, 2024, all labs will be drawn in the cancer center.  You do not have to check in or register with the main entrance as you have in the past but will complete your check-in in the cancer center.  Wear comfortable clothing and clothing appropriate for easy access to any Portacath or PICC line.   We strive to give you quality time with your provider. You may need to reschedule your appointment if you arrive late (15 or more minutes).  Arriving late affects you and other patients whose appointments are after yours.  Also, if you miss three or more appointments without notifying the office, you may be dismissed from the clinic at the provider's discretion.      For prescription refill requests, have your pharmacy contact our office and allow 72 hours for refills to be completed.    Today you received the following chemotherapy and/or immunotherapy agents Etoposide      To help prevent nausea and vomiting after your treatment, we encourage you to take your nausea medication as directed.  BELOW ARE SYMPTOMS THAT SHOULD BE REPORTED IMMEDIATELY: *FEVER GREATER THAN 100.4 F (38 C) OR HIGHER *CHILLS OR SWEATING *NAUSEA AND VOMITING THAT IS NOT CONTROLLED WITH YOUR NAUSEA MEDICATION *UNUSUAL SHORTNESS OF BREATH *UNUSUAL BRUISING OR BLEEDING *URINARY PROBLEMS (pain or burning when urinating, or frequent urination) *BOWEL PROBLEMS (unusual diarrhea, constipation, pain near the anus) TENDERNESS IN MOUTH AND THROAT WITH OR WITHOUT PRESENCE OF ULCERS (sore throat, sores in mouth, or a toothache) UNUSUAL RASH, SWELLING OR PAIN  UNUSUAL VAGINAL DISCHARGE OR ITCHING   Items with * indicate a potential emergency and should be followed up  as soon as possible or go to the Emergency Department if any problems should occur.  Please show the CHEMOTHERAPY ALERT CARD or IMMUNOTHERAPY ALERT CARD at check-in to the Emergency Department and triage nurse.  Should you have questions after your visit or need to cancel or reschedule your appointment, please contact [facility] [place] - A DEPT OF [facility] [phone]  and follow the prompts.  Office hours are 8:00 a.m. to 4:30 p.m. Monday - Friday. Please note that voicemails left after 4:00 p.m. may not be returned until the following business day.  We are closed weekends and major holidays. You have access to a nurse at all times for urgent questions. Please call the main number to the clinic [phone] and follow the prompts.  For any non-urgent questions, you may also contact your provider using MyChart. We now offer e-Visits for anyone 62 and older to request care online for non-urgent symptoms. For details visit [email or web address].   Also download the MyChart app! Go to the app store, search "MyChart", open the app, select [place], and log in with your MyChart username and password.

## 2024-02-21 ENCOUNTER — Other Ambulatory Visit: Payer: Self-pay

## 2024-02-21 ENCOUNTER — Inpatient Hospital Stay

## 2024-02-21 ENCOUNTER — Ambulatory Visit
Admission: RE | Admit: 2024-02-21 | Discharge: 2024-02-21 | Disposition: A | Discharge status: Home / Self Care | Source: Ambulatory Visit | Attending: Radiation Oncology

## 2024-02-21 ENCOUNTER — Other Ambulatory Visit: Payer: Self-pay | Admitting: *Deleted

## 2024-02-21 VITALS — BP 131/70 | HR 93 | Temp 97.5°F | Resp 19

## 2024-02-21 DIAGNOSIS — C3412 Malignant neoplasm of upper lobe, left bronchus or lung: Secondary | ICD-10-CM

## 2024-02-21 LAB — RAD ONC ARIA SESSION SUMMARY
Course Elapsed Days: 2
Plan Fractions Treated to Date: 3
Plan Prescribed Dose Per Fraction: 2 Gy
Plan Total Fractions Prescribed: 30
Plan Total Prescribed Dose: 60 Gy
Reference Point Dosage Given to Date: 6 Gy
Reference Point Session Dosage Given: 2 Gy
Session Number: 3

## 2024-02-21 MED ORDER — SODIUM CHLORIDE 0.9% FLUSH
10.0000 mL | INTRAVENOUS | Status: DC | PRN
  Administered 2024-02-21: 10 mL

## 2024-02-21 MED ORDER — HEPARIN SOD (PORK) LOCK FLUSH 100 UNIT/ML IV SOLN
500.0000 [IU] | Freq: Once | INTRAVENOUS | Status: AC | PRN
  Administered 2024-02-21: 500 [IU]

## 2024-02-21 MED ORDER — MIRTAZAPINE 45 MG PO TBDP
45.0000 mg | ORAL_TABLET | Freq: Every day | ORAL | 2 refills | Status: AC
Start: 2024-02-21 — End: ?

## 2024-02-21 MED ORDER — SODIUM CHLORIDE 0.9 % IV SOLN
50.0000 mg/m2 | Freq: Once | INTRAVENOUS | Status: AC
  Administered 2024-02-21: 80 mg via INTRAVENOUS
  Filled 2024-02-21: qty 80

## 2024-02-21 MED ORDER — SODIUM CHLORIDE 0.9 % IV SOLN: INTRAVENOUS | Status: DC

## 2024-02-21 MED ORDER — MAGNESIUM SULFATE 2 GM/50ML IV SOLN
2.0000 g | Freq: Once | INTRAVENOUS | Status: AC
  Administered 2024-02-21: 2 g via INTRAVENOUS
  Filled 2024-02-21: qty 50

## 2024-02-21 MED ORDER — SODIUM CHLORIDE 0.9 % IV SOLN
50.0000 mg/m2 | Freq: Once | INTRAVENOUS | Status: AC
  Administered 2024-02-21: 80 mg via INTRAVENOUS
  Filled 2024-02-21: qty 4

## 2024-02-21 MED ORDER — POTASSIUM CHLORIDE IN NACL 20-0.9 MEQ/L-% IV SOLN
Freq: Once | INTRAVENOUS | Status: AC
  Filled 2024-02-21: qty 1000

## 2024-02-21 MED ORDER — SODIUM CHLORIDE 0.9 % IV SOLN
150.0000 mg | Freq: Once | INTRAVENOUS | Status: AC
  Administered 2024-02-21: 150 mg via INTRAVENOUS
  Filled 2024-02-21: qty 150

## 2024-02-21 MED ORDER — SODIUM CHLORIDE 0.9 % IV SOLN: Freq: Once | INTRAVENOUS | Status: AC

## 2024-02-21 MED ORDER — DEXAMETHASONE SODIUM PHOSPHATE 10 MG/ML IJ SOLN
10.0000 mg | Freq: Once | INTRAMUSCULAR | Status: AC
  Administered 2024-02-21: 10 mg via INTRAVENOUS
  Filled 2024-02-21: qty 1

## 2024-02-21 NOTE — Progress Notes (Signed)
 Patient presents today for C1D2 Cisplatin , VP-16. Vital signs within parameters for treatment. Patient voided 175 mls . Patient denies any side effects related to yesterday's treatment.   Treatment given today per MD orders. Tolerated infusion without adverse affects. Vital signs stable. No complaints at this time. Discharged from clinic ambulatory in stable condition. Alert and oriented x 3. F/U with [facility] as scheduled.

## 2024-02-21 NOTE — Patient Instructions (Signed)
 [facility] [place] - A DEPT OF [facility]  Discharge Instructions: Thank you for choosing [facility] to provide your oncology and hematology care.  If you have a lab appointment with the Cancer Center - please note that after April 8th, 2024, all labs will be drawn in the cancer center.  You do not have to check in or register with the main entrance as you have in the past but will complete your check-in in the cancer center.  Wear comfortable clothing and clothing appropriate for easy access to any Portacath or PICC line.   We strive to give you quality time with your provider. You may need to reschedule your appointment if you arrive late (15 or more minutes).  Arriving late affects you and other patients whose appointments are after yours.  Also, if you miss three or more appointments without notifying the office, you may be dismissed from the clinic at the provider's discretion.      For prescription refill requests, have your pharmacy contact our office and allow 72 hours for refills to be completed.    Today you received the following chemotherapy and/or immunotherapy agents Cisplatin , Etoposide . Etoposide  Capsules What is this medication? ETOPOSIDE  (e toe POE side) treats lung cancer. It works by slowing down the growth of cancer cells. This medicine may be used for other purposes; ask your health care provider or pharmacist if you have questions. COMMON BRAND NAME(S): VePesid  What should I tell my care team before I take this medication? They need to know if you have any of these conditions: Infection Kidney disease Liver disease Low blood counts, such as low white cell, platelet, red cell counts An unusual or allergic reaction to etoposide , other medications, foods, dyes, or preservatives Pregnant or trying to get pregnant Breastfeeding How should I use this medication? Take this medication by mouth with a glass of water. Take it as  directed on the prescription label. Do not cut, crush, or chew this medication. Swallow the capsules whole. Do not take it more often than directed. Keep taking it unless your care team tells you to stop. Handling this medication may be harmful. Wear gloves while touching this medication or bottle. Talk to your care team about how to handle this medication. Special instructions may apply. Talk to your care team about the use of this medication in children. Special care may be needed. Overdosage: If you think you have taken too much of this medicine contact a poison control center or emergency room at once. NOTE: This medicine is only for you. Do not share this medicine with others. What if I miss a dose? If you miss a dose, take it as soon as you can. If it is almost time for your next dose, take only that dose. Do not take double or extra doses. What may interact with this medication? Cyclosporine Warfarin This list may not describe all possible interactions. Give your health care provider a list of all the medicines, herbs, non-prescription drugs, or dietary supplements you use. Also tell them if you smoke, drink alcohol, or use illegal drugs. Some items may interact with your medicine. What should I watch for while using this medication? Your condition will be monitored carefully while you are receiving this medication. This medication may make you feel generally unwell. This is not uncommon as chemotherapy can affect healthy cells as well as cancer cells. Report any side effects. Continue your course of treatment even though  you feel ill unless your care team tells you to stop. This medication may increase your risk of getting an infection. Call your care team for advice if you get a fever, chills, sore throat, or other symptoms of a cold or flu. Do not treat yourself. Try to avoid being around people who are sick. This medication may increase your risk to bruise or bleed. Call your care team if  you notice any unusual bleeding. Talk to your care team about your risk of cancer. You may be more at risk for certain types of cancers if you take this medication. Talk to your care team if you may be pregnant. Serious birth defects can occur if you take this medication during pregnancy and for 6 months after the last dose. You will need a negative pregnancy test before starting this medication. Contraception is recommended while taking this medication and for 6 months after the last dose. Your care team can help you find the option that works for you. If your partner can get pregnant, use a condom during sex while taking this medication and for 4 months after the last dose. Do not breastfeed while taking this medication. This medication may cause infertility. Talk to your care team if you are concerned about your fertility. What side effects may I notice from receiving this medication? Side effects that you should report to your care team as soon as possible: Allergic reactions--skin rash, itching, hives, swelling of the face, lips, tongue, or throat Infection--fever, chills, cough, sore throat, wounds that don't heal, pain or trouble when passing urine, general feeling of discomfort or being unwell Low red blood cell level--unusual weakness or fatigue, dizziness, headache, trouble breathing Unusual bruising or bleeding Side effects that usually do not require medical attention (report to your care team if they continue or are bothersome): Diarrhea Fatigue Hair loss Loss of appetite Nausea Pain, redness, or swelling with sores inside the mouth or throat Vomiting This list may not describe all possible side effects. Call your doctor for medical advice about side effects. You may report side effects to FDA at 1-800-FDA-1088. Where should I keep my medication? Keep out of the reach of children and pets. Store in a refrigerator between 2 and 8 degrees C (36 and 46 degrees F). Do not freeze. Get  rid any unused medication after the expiration date. To get rid of medications that are no longer needed or have expired: Take the medication to a medication take-back program. Check with your pharmacy or law enforcement to find a location. If you cannot return the medication, ask your pharmacist or care team how to get rid of this medication safely. NOTE: This sheet is a summary. It may not cover all possible information. If you have questions about this medicine, talk to your doctor, pharmacist, or health care provider.  2024 Elsevier/Gold Standard (2021-12-23 00:00:00)Cisplatin  Injection What is this medication? CISPLATIN  (SIS pla tin) treats some types of cancer. It works by slowing down the growth of cancer cells. This medicine may be used for other purposes; ask your health care provider or pharmacist if you have questions. COMMON BRAND NAME(S): Platinol , Platinol  -AQ What should I tell my care team before I take this medication? They need to know if you have any of these conditions: Eye disease, vision problems Hearing problems Kidney disease Low blood counts, such as low white cells, platelets, or red blood cells Tingling of the fingers or toes, or other nerve disorder An unusual or allergic reaction to  cisplatin , carboplatin, oxaliplatin, other medications, foods, dyes, or preservatives If you or your partner are pregnant or trying to get pregnant Breast-feeding How should I use this medication? This medication is injected into a vein. It is given by your care team in a hospital or clinic setting. Talk to your care team about the use of this medication in children. Special care may be needed. Overdosage: If you think you have taken too much of this medicine contact a poison control center or emergency room at once. NOTE: This medicine is only for you. Do not share this medicine with others. What if I miss a dose? Keep appointments for follow-up doses. It is important not to miss  your dose. Call your care team if you are unable to keep an appointment. What may interact with this medication? Do not take this medication with any of the following: Live virus vaccines This medication may also interact with the following: Certain antibiotics, such as amikacin, gentamicin, neomycin, polymyxin B, streptomycin, tobramycin, vancomycin Foscarnet This list may not describe all possible interactions. Give your health care provider a list of all the medicines, herbs, non-prescription drugs, or dietary supplements you use. Also tell them if you smoke, drink alcohol, or use illegal drugs. Some items may interact with your medicine. What should I watch for while using this medication? Your condition will be monitored carefully while you are receiving this medication. You may need blood work done while taking this medication. This medication may make you feel generally unwell. This is not uncommon, as chemotherapy can affect healthy cells as well as cancer cells. Report any side effects. Continue your course of treatment even though you feel ill unless your care team tells you to stop. This medication may increase your risk of getting an infection. Call your care team for advice if you get a fever, chills, sore throat, or other symptoms of a cold or flu. Do not treat yourself. Try to avoid being around people who are sick. Avoid taking medications that contain aspirin, acetaminophen , ibuprofen , naproxen, or ketoprofen unless instructed by your care team. These medications may hide a fever. This medication may increase your risk to bruise or bleed. Call your care team if you notice any unusual bleeding. Be careful brushing or flossing your teeth or using a toothpick because you may get an infection or bleed more easily. If you have any dental work done, tell your dentist you are receiving this medication. Drink fluids as directed while you are taking this medication. This will help protect your  kidneys. Call your care team if you get diarrhea. Do not treat yourself. Talk to your care team if you or your partner wish to become pregnant or think you might be pregnant. This medication can cause serious birth defects if taken during pregnancy and for 14 months after the last dose. A negative pregnancy test is required before starting this medication. A reliable form of contraception is recommended while taking this medication and for 14 months after the last dose. Talk to your care team about effective forms of contraception. Do not father a child while taking this medication and for 11 months after the last dose. Use a condom during sex during this time period. Do not breast-feed while taking this medication. This medication may cause infertility. Talk to your care team if you are concerned about your fertility. What side effects may I notice from receiving this medication? Side effects that you should report to your care team as soon as  possible: Allergic reactions--skin rash, itching, hives, swelling of the face, lips, tongue, or throat Eye pain, change in vision, vision loss Hearing loss, ringing in ears Infection--fever, chills, cough, sore throat, wounds that don't heal, pain or trouble when passing urine, general feeling of discomfort or being unwell Kidney injury--decrease in the amount of urine, swelling of the ankles, hands, or feet Low red blood cell level--unusual weakness or fatigue, dizziness, headache, trouble breathing Painful swelling, warmth, or redness of the skin, blisters or sores at the infusion site Pain, tingling, or numbness in the hands or feet Unusual bruising or bleeding Side effects that usually do not require medical attention (report to your care team if they continue or are bothersome): Hair loss Nausea Vomiting This list may not describe all possible side effects. Call your doctor for medical advice about side effects. You may report side effects to FDA at  1-800-FDA-1088. Where should I keep my medication? This medication is given in a hospital or clinic. It will not be stored at home. NOTE: This sheet is a summary. It may not cover all possible information. If you have questions about this medicine, talk to your doctor, pharmacist, or health care provider.  2024 Elsevier/Gold Standard (2021-12-03 00:00:00)      To help prevent nausea and vomiting after your treatment, we encourage you to take your nausea medication as directed.  BELOW ARE SYMPTOMS THAT SHOULD BE REPORTED IMMEDIATELY: *FEVER GREATER THAN 100.4 F (38 C) OR HIGHER *CHILLS OR SWEATING *NAUSEA AND VOMITING THAT IS NOT CONTROLLED WITH YOUR NAUSEA MEDICATION *UNUSUAL SHORTNESS OF BREATH *UNUSUAL BRUISING OR BLEEDING *URINARY PROBLEMS (pain or burning when urinating, or frequent urination) *BOWEL PROBLEMS (unusual diarrhea, constipation, pain near the anus) TENDERNESS IN MOUTH AND THROAT WITH OR WITHOUT PRESENCE OF ULCERS (sore throat, sores in mouth, or a toothache) UNUSUAL RASH, SWELLING OR PAIN  UNUSUAL VAGINAL DISCHARGE OR ITCHING   Items with * indicate a potential emergency and should be followed up as soon as possible or go to the Emergency Department if any problems should occur.  Please show the CHEMOTHERAPY ALERT CARD or IMMUNOTHERAPY ALERT CARD at check-in to the Emergency Department and triage nurse.  Should you have questions after your visit or need to cancel or reschedule your appointment, please contact [facility] [place] - A DEPT OF [facility] [phone]  and follow the prompts.  Office hours are 8:00 a.m. to 4:30 p.m. Monday - Friday. Please note that voicemails left after 4:00 p.m. may not be returned until the following business day.  We are closed weekends and major holidays. You have access to a nurse at all times for urgent questions. Please call the main number to the clinic [phone] and follow the prompts.  For any  non-urgent questions, you may also contact your provider using MyChart. We now offer e-Visits for anyone 39 and older to request care online for non-urgent symptoms. For details visit [email or web address].   Also download the MyChart app! Go to the app store, search MyChart, open the app, select [place], and log in with your MyChart username and password.

## 2024-02-22 ENCOUNTER — Ambulatory Visit
Admission: RE | Admit: 2024-02-22 | Discharge: 2024-02-22 | Disposition: A | Discharge status: Home / Self Care | Source: Ambulatory Visit | Attending: Radiation Oncology | Admitting: Radiation Oncology

## 2024-02-22 ENCOUNTER — Other Ambulatory Visit: Payer: Self-pay

## 2024-02-22 ENCOUNTER — Inpatient Hospital Stay

## 2024-02-22 ENCOUNTER — Telehealth: Payer: Self-pay | Admitting: Hematology

## 2024-02-22 VITALS — BP 124/73 | HR 80 | Temp 96.6°F | Resp 18

## 2024-02-22 DIAGNOSIS — C3412 Malignant neoplasm of upper lobe, left bronchus or lung: Secondary | ICD-10-CM

## 2024-02-22 LAB — RAD ONC ARIA SESSION SUMMARY
Course Elapsed Days: 3
Plan Fractions Treated to Date: 4
Plan Prescribed Dose Per Fraction: 2 Gy
Plan Total Fractions Prescribed: 30
Plan Total Prescribed Dose: 60 Gy
Reference Point Dosage Given to Date: 8 Gy
Reference Point Session Dosage Given: 2 Gy
Session Number: 4

## 2024-02-22 MED ORDER — SODIUM CHLORIDE 0.9 % IV SOLN: INTRAVENOUS | Status: DC

## 2024-02-22 MED ORDER — HEPARIN SOD (PORK) LOCK FLUSH 100 UNIT/ML IV SOLN
500.0000 [IU] | Freq: Once | INTRAVENOUS | Status: AC | PRN
  Administered 2024-02-22: 500 [IU]

## 2024-02-22 MED ORDER — DEXAMETHASONE SODIUM PHOSPHATE 10 MG/ML IJ SOLN
10.0000 mg | Freq: Once | INTRAMUSCULAR | Status: AC
  Administered 2024-02-22: 10 mg via INTRAVENOUS
  Filled 2024-02-22: qty 1

## 2024-02-22 MED ORDER — SODIUM CHLORIDE 0.9% FLUSH
10.0000 mL | INTRAVENOUS | Status: DC | PRN
  Administered 2024-02-22: 10 mL

## 2024-02-22 MED ORDER — SODIUM CHLORIDE 0.9 % IV SOLN
50.0000 mg/m2 | Freq: Once | INTRAVENOUS | Status: AC
  Administered 2024-02-22: 80 mg via INTRAVENOUS
  Filled 2024-02-22: qty 4

## 2024-02-22 NOTE — Patient Instructions (Signed)
 [facility] [place] - A DEPT OF [place]  Discharge Instructions: Thank you for choosing [facility] to provide your oncology and hematology care.  If you have a lab appointment with the Cancer Center - please note that after April 8th, 2024, all labs will be drawn in the cancer center.  You do not have to check in or register with the main entrance as you have in the past but will complete your check-in in the cancer center.  Wear comfortable clothing and clothing appropriate for easy access to any Portacath or PICC line.   We strive to give you quality time with your provider. You may need to reschedule your appointment if you arrive late (15 or more minutes).  Arriving late affects you and other patients whose appointments are after yours.  Also, if you miss three or more appointments without notifying the office, you may be dismissed from the clinic at the provider's discretion.      For prescription refill requests, have your pharmacy contact our office and allow 72 hours for refills to be completed.    Today you received the following chemotherapy and/or immunotherapy agents Etoposide , return as scheduled.   To help prevent nausea and vomiting after your treatment, we encourage you to take your nausea medication as directed.  BELOW ARE SYMPTOMS THAT SHOULD BE REPORTED IMMEDIATELY: *FEVER GREATER THAN 100.4 F (38 C) OR HIGHER *CHILLS OR SWEATING *NAUSEA AND VOMITING THAT IS NOT CONTROLLED WITH YOUR NAUSEA MEDICATION *UNUSUAL SHORTNESS OF BREATH *UNUSUAL BRUISING OR BLEEDING *URINARY PROBLEMS (pain or burning when urinating, or frequent urination) *BOWEL PROBLEMS (unusual diarrhea, constipation, pain near the anus) TENDERNESS IN MOUTH AND THROAT WITH OR WITHOUT PRESENCE OF ULCERS (sore throat, sores in mouth, or a toothache) UNUSUAL RASH, SWELLING OR PAIN  UNUSUAL VAGINAL DISCHARGE OR ITCHING   Items with * indicate a potential emergency and  should be followed up as soon as possible or go to the Emergency Department if any problems should occur.  Please show the CHEMOTHERAPY ALERT CARD or IMMUNOTHERAPY ALERT CARD at check-in to the Emergency Department and triage nurse.  Should you have questions after your visit or need to cancel or reschedule your appointment, please contact [facility] [place] - [facility] DEPT [facility] [phone]  and follow the prompts.  Office hours are 8:00 a.m. to 4:30 p.m. Monday - Friday. Please note that voicemails left after 4:00 p.m. may not be returned until the following business day.  We are closed weekends and major holidays. You have access to a nurse at all times for urgent questions. Please call the main number to the clinic [phone] and follow the prompts.  For any non-urgent questions, you may also contact your provider using MyChart. We now offer e-Visits for anyone 59 and older to request care online for non-urgent symptoms. For details visit [email or web address].   Also download the MyChart app! Go to the app store, search MyChart, open the app, select [place], and log in with your MyChart username and password.

## 2024-02-22 NOTE — Progress Notes (Signed)
 Canceling day 5 of this cycle per Dr Rogers - patient with delay start by 1 day on cycle 1.  V.O. Dr Theadore Molt, PharmD

## 2024-02-22 NOTE — Telephone Encounter (Signed)
 Pt requested information regarding the [employer]. She does receive food assistance and is eligible for the grant.  I advised pt of what was needed for enrollment. She said she didn't know if she could find the information and she may not be interested in enrolling. I advised her of the all the services that the grant will pay for and let her know to reach back out if she changes her mind.

## 2024-02-22 NOTE — Progress Notes (Signed)
Patient tolerated chemotherapy with no complaints voiced. Side effects with management reviewed understanding verbalized. Port site clean and dry with no bruising or swelling noted at site. Good blood return noted before and after administration of chemotherapy. Band aid applied. Patient left in satisfactory condition with VSS and no s/s of distress noted. 

## 2024-02-23 ENCOUNTER — Other Ambulatory Visit: Payer: Self-pay

## 2024-02-23 ENCOUNTER — Ambulatory Visit
Admission: RE | Admit: 2024-02-23 | Discharge: 2024-02-23 | Disposition: A | Discharge status: Home / Self Care | Source: Ambulatory Visit | Attending: Radiation Oncology

## 2024-02-23 ENCOUNTER — Ambulatory Visit
Admission: RE | Admit: 2024-02-23 | Discharge: 2024-02-23 | Disposition: A | Discharge status: Home / Self Care | Source: Ambulatory Visit | Attending: Radiation Oncology | Admitting: Radiation Oncology

## 2024-02-23 ENCOUNTER — Inpatient Hospital Stay

## 2024-02-23 VITALS — BP 136/78 | HR 87 | Temp 97.6°F | Resp 18

## 2024-02-23 DIAGNOSIS — C3412 Malignant neoplasm of upper lobe, left bronchus or lung: Secondary | ICD-10-CM

## 2024-02-23 LAB — RAD ONC ARIA SESSION SUMMARY
Course Elapsed Days: 4
Plan Fractions Treated to Date: 5
Plan Prescribed Dose Per Fraction: 2 Gy
Plan Total Fractions Prescribed: 30
Plan Total Prescribed Dose: 60 Gy
Reference Point Dosage Given to Date: 10 Gy
Reference Point Session Dosage Given: 2 Gy
Session Number: 5

## 2024-02-23 MED ORDER — DEXAMETHASONE SODIUM PHOSPHATE 10 MG/ML IJ SOLN
10.0000 mg | Freq: Once | INTRAMUSCULAR | Status: AC
  Administered 2024-02-23: 10 mg via INTRAVENOUS
  Filled 2024-02-23: qty 1

## 2024-02-23 MED ORDER — HEPARIN SOD (PORK) LOCK FLUSH 100 UNIT/ML IV SOLN
500.0000 [IU] | Freq: Once | INTRAVENOUS | Status: AC | PRN
  Administered 2024-02-23: 500 [IU]

## 2024-02-23 MED ORDER — SODIUM CHLORIDE 0.9 % IV SOLN
50.0000 mg/m2 | Freq: Once | INTRAVENOUS | Status: AC
  Administered 2024-02-23: 80 mg via INTRAVENOUS
  Filled 2024-02-23: qty 4

## 2024-02-23 MED ORDER — SODIUM CHLORIDE 0.9 % IV SOLN: INTRAVENOUS | Status: DC

## 2024-02-23 MED ORDER — SODIUM CHLORIDE 0.9% FLUSH
10.0000 mL | INTRAVENOUS | Status: DC | PRN
Start: 2024-02-23 — End: 2024-02-23
  Administered 2024-02-23: 10 mL

## 2024-02-23 NOTE — Patient Instructions (Signed)
 [facility] [place] - A DEPT OF [place]  Discharge Instructions: Thank you for choosing [facility] to provide your oncology and hematology care.  If you have a lab appointment with the Cancer Center - please note that after April 8th, 2024, all labs will be drawn in the cancer center.  You do not have to check in or register with the main entrance as you have in the past but will complete your check-in in the cancer center.  Wear comfortable clothing and clothing appropriate for easy access to any Portacath or PICC line.   We strive to give you quality time with your provider. You may need to reschedule your appointment if you arrive late (15 or more minutes).  Arriving late affects you and other patients whose appointments are after yours.  Also, if you miss three or more appointments without notifying the office, you may be dismissed from the clinic at the provider's discretion.      For prescription refill requests, have your pharmacy contact our office and allow 72 hours for refills to be completed.    Today you received the following chemotherapy and/or immunotherapy agents Etoposide , return as scheduled.   To help prevent nausea and vomiting after your treatment, we encourage you to take your nausea medication as directed.  BELOW ARE SYMPTOMS THAT SHOULD BE REPORTED IMMEDIATELY: *FEVER GREATER THAN 100.4 F (38 C) OR HIGHER *CHILLS OR SWEATING *NAUSEA AND VOMITING THAT IS NOT CONTROLLED WITH YOUR NAUSEA MEDICATION *UNUSUAL SHORTNESS OF BREATH *UNUSUAL BRUISING OR BLEEDING *URINARY PROBLEMS (pain or burning when urinating, or frequent urination) *BOWEL PROBLEMS (unusual diarrhea, constipation, pain near the anus) TENDERNESS IN MOUTH AND THROAT WITH OR WITHOUT PRESENCE OF ULCERS (sore throat, sores in mouth, or a toothache) UNUSUAL RASH, SWELLING OR PAIN  UNUSUAL VAGINAL DISCHARGE OR ITCHING   Items with * indicate a potential emergency and  should be followed up as soon as possible or go to the Emergency Department if any problems should occur.  Please show the CHEMOTHERAPY ALERT CARD or IMMUNOTHERAPY ALERT CARD at check-in to the Emergency Department and triage nurse.  Should you have questions after your visit or need to cancel or reschedule your appointment, please contact [facility] [place] - A DEPT OF [facility] [phone]  and follow the prompts.  Office hours are 8:00 a.m. to 4:30 p.m. Monday - Friday. Please note that voicemails left after 4:00 p.m. may not be returned until the following business day.  We are closed weekends and major holidays. You have access to a nurse at all times for urgent questions. Please call the main number to the clinic [phone] and follow the prompts.  For any non-urgent questions, you may also contact your provider using MyChart. We now offer e-Visits for anyone 21 and older to request care online for non-urgent symptoms. For details visit [email or web address].   Also download the MyChart app! Go to the app store, search MyChart, open the app, select [place], and log in with your MyChart username and password.

## 2024-02-23 NOTE — Progress Notes (Signed)
Patient tolerated chemotherapy with no complaints voiced. Side effects with management reviewed understanding verbalized. Port site clean and dry with no bruising or swelling noted at site. Good blood return noted before and after administration of chemotherapy. Band aid applied. Patient left in satisfactory condition with VSS and no s/s of distress noted. 

## 2024-02-26 ENCOUNTER — Telehealth: Payer: Self-pay

## 2024-02-26 ENCOUNTER — Other Ambulatory Visit: Payer: Self-pay

## 2024-02-26 ENCOUNTER — Ambulatory Visit
Admission: RE | Admit: 2024-02-26 | Discharge: 2024-02-26 | Disposition: A | Discharge status: Home / Self Care | Source: Ambulatory Visit | Attending: Radiation Oncology

## 2024-02-26 DIAGNOSIS — C3412 Malignant neoplasm of upper lobe, left bronchus or lung: Secondary | ICD-10-CM | POA: Diagnosis not present

## 2024-02-26 LAB — RAD ONC ARIA SESSION SUMMARY
Course Elapsed Days: 7
Plan Fractions Treated to Date: 6
Plan Prescribed Dose Per Fraction: 2 Gy
Plan Total Fractions Prescribed: 30
Plan Total Prescribed Dose: 60 Gy
Reference Point Dosage Given to Date: 12 Gy
Reference Point Session Dosage Given: 2 Gy
Session Number: 6

## 2024-02-26 NOTE — Telephone Encounter (Signed)
 24 hour call back-called patient to check on her since she started chemo last week and was here everyday for chemotherapy. Patient stated she is doing well , no issues at this time.

## 2024-02-27 ENCOUNTER — Inpatient Hospital Stay

## 2024-02-27 ENCOUNTER — Ambulatory Visit

## 2024-02-27 ENCOUNTER — Other Ambulatory Visit: Payer: Self-pay

## 2024-02-27 ENCOUNTER — Ambulatory Visit
Admission: RE | Admit: 2024-02-27 | Discharge: 2024-02-27 | Disposition: A | Discharge status: Home / Self Care | Source: Ambulatory Visit | Attending: Radiation Oncology | Admitting: Radiation Oncology

## 2024-02-27 ENCOUNTER — Inpatient Hospital Stay (HOSPITAL_BASED_OUTPATIENT_CLINIC_OR_DEPARTMENT_OTHER): Admitting: Hematology

## 2024-02-27 ENCOUNTER — Other Ambulatory Visit

## 2024-02-27 VITALS — BP 128/79 | HR 110 | Temp 97.1°F | Resp 18

## 2024-02-27 DIAGNOSIS — R918 Other nonspecific abnormal finding of lung field: Secondary | ICD-10-CM

## 2024-02-27 DIAGNOSIS — C3412 Malignant neoplasm of upper lobe, left bronchus or lung: Secondary | ICD-10-CM

## 2024-02-27 DIAGNOSIS — K118 Other diseases of salivary glands: Secondary | ICD-10-CM

## 2024-02-27 LAB — CBC WITH DIFFERENTIAL/PLATELET
Abs Immature Granulocytes: 0.19 K/uL — ABNORMAL HIGH (ref 0.00–0.07)
Basophils Absolute: 0 K/uL (ref 0.0–0.1)
Basophils Relative: 0 %
Eosinophils Absolute: 0.1 K/uL (ref 0.0–0.5)
Eosinophils Relative: 1 %
HCT: 38.1 % (ref 36.0–46.0)
Hemoglobin: 12.2 g/dL (ref 12.0–15.0)
Immature Granulocytes: 2 %
Lymphocytes Relative: 7 %
Lymphs Abs: 0.6 K/uL — ABNORMAL LOW (ref 0.7–4.0)
MCH: 32.4 pg (ref 26.0–34.0)
MCHC: 32 g/dL (ref 30.0–36.0)
MCV: 101.1 fL — ABNORMAL HIGH (ref 80.0–100.0)
Monocytes Absolute: 0.2 K/uL (ref 0.1–1.0)
Monocytes Relative: 2 %
Neutro Abs: 7.3 K/uL (ref 1.7–7.7)
Neutrophils Relative %: 88 %
Platelets: 364 K/uL (ref 150–400)
RBC: 3.77 MIL/uL — ABNORMAL LOW (ref 3.87–5.11)
RDW: 12.4 % (ref 11.5–15.5)
WBC: 8.3 K/uL (ref 4.0–10.5)
nRBC: 0 % (ref 0.0–0.2)

## 2024-02-27 LAB — RAD ONC ARIA SESSION SUMMARY
Course Elapsed Days: 8
Plan Fractions Treated to Date: 7
Plan Prescribed Dose Per Fraction: 2 Gy
Plan Total Fractions Prescribed: 30
Plan Total Prescribed Dose: 60 Gy
Reference Point Dosage Given to Date: 14 Gy
Reference Point Session Dosage Given: 2 Gy
Session Number: 7

## 2024-02-27 LAB — COMPREHENSIVE METABOLIC PANEL WITH GFR
ALT: 19 U/L (ref 0–44)
AST: 13 U/L — ABNORMAL LOW (ref 15–41)
Albumin: 2.9 g/dL — ABNORMAL LOW (ref 3.5–5.0)
Alkaline Phosphatase: 54 U/L (ref 38–126)
Anion gap: 14 (ref 5–15)
BUN: 17 mg/dL (ref 8–23)
CO2: 21 mmol/L — ABNORMAL LOW (ref 22–32)
Calcium: 9.4 mg/dL (ref 8.9–10.3)
Chloride: 102 mmol/L (ref 98–111)
Creatinine, Ser: 0.62 mg/dL (ref 0.44–1.00)
GFR, Estimated: >60 mL/min (ref >60)
Glucose, Bld: 93 mg/dL (ref 70–99)
Potassium: 3.4 mmol/L — ABNORMAL LOW (ref 3.5–5.1)
Sodium: 137 mmol/L (ref 135–145)
Total Bilirubin: 0.8 mg/dL (ref 0.0–1.2)
Total Protein: 7.3 g/dL (ref 6.5–8.1)

## 2024-02-27 LAB — MAGNESIUM: Magnesium: 1.9 mg/dL (ref 1.7–2.4)

## 2024-02-27 MED ORDER — MAGNESIUM SULFATE 2 GM/50ML IV SOLN
2.0000 g | Freq: Once | INTRAVENOUS | Status: AC
  Administered 2024-02-27: 2 g via INTRAVENOUS
  Filled 2024-02-27: qty 50

## 2024-02-27 MED ORDER — SODIUM CHLORIDE 0.9 % IV SOLN: Freq: Once | INTRAVENOUS | Status: AC

## 2024-02-27 MED ORDER — SODIUM CHLORIDE 0.9 % IV SOLN
150.0000 mg | Freq: Once | INTRAVENOUS | Status: AC
  Administered 2024-02-27: 150 mg via INTRAVENOUS
  Filled 2024-02-27: qty 150

## 2024-02-27 MED ORDER — SODIUM CHLORIDE 0.9 % IV SOLN
INTRAVENOUS | Status: DC
Start: 2024-02-27 — End: 2024-02-27

## 2024-02-27 MED ORDER — DEXAMETHASONE SODIUM PHOSPHATE 10 MG/ML IJ SOLN
10.0000 mg | Freq: Once | INTRAMUSCULAR | Status: AC
  Administered 2024-02-27: 10 mg via INTRAVENOUS
  Filled 2024-02-27: qty 1

## 2024-02-27 MED ORDER — PALONOSETRON HCL INJECTION 0.25 MG/5ML
0.2500 mg | Freq: Once | INTRAVENOUS | Status: AC
  Administered 2024-02-27: 0.25 mg via INTRAVENOUS
  Filled 2024-02-27: qty 5

## 2024-02-27 MED ORDER — SODIUM CHLORIDE 0.9% FLUSH
10.0000 mL | INTRAVENOUS | Status: DC | PRN
  Administered 2024-02-27: 10 mL

## 2024-02-27 MED ORDER — SODIUM CHLORIDE 0.9 % IV SOLN
50.0000 mg/m2 | Freq: Once | INTRAVENOUS | Status: AC
  Administered 2024-02-27: 80 mg via INTRAVENOUS
  Filled 2024-02-27: qty 80

## 2024-02-27 MED ORDER — POTASSIUM CHLORIDE IN NACL 20-0.9 MEQ/L-% IV SOLN
Freq: Once | INTRAVENOUS | Status: AC
  Filled 2024-02-27: qty 1000

## 2024-02-27 MED ORDER — HEPARIN SOD (PORK) LOCK FLUSH 100 UNIT/ML IV SOLN
500.0000 [IU] | Freq: Once | INTRAVENOUS | Status: AC | PRN
  Administered 2024-02-27: 500 [IU]

## 2024-02-27 NOTE — Progress Notes (Signed)
 [facility] [address] [place] [address]    Clinic Day:  02/27/2024  Referring physician: [name], FNP  Patient Care Team: [name], FNP as PCP - General (Family Medicine) Darlean Ozell NOVAK, MD as Consulting Physician (Pulmonary Disease)   ASSESSMENT & PLAN:   Assessment: 1.  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  Mass abuts anterior pleural surface.  There is mild cortical erosion along the inner aspect of the left anterior third rib abutting the left upper lobe mass consistent with direct invasion.  Indeterminate 5 mm subsolid LUL nodule too small to characterize. - Denies any fevers, night sweats or weight loss.  No hemoptysis. - She reports burning all over the body.  Also reports left chest wall pain, aching type preventing her to sleep on the left side.  She is taking Tylenol  as needed which helps. - Guardant360: PIK3CA E545K, PTEN deletion (exon 1), T p53, BLM - PET scan (09/01/2023): 7 cm anterior left upper lobe mass invading left chest wall with destruction of the left third anterior rib.  No adenopathy or distant metastatic disease.  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 - MRI brain (08/31/2022): No evidence of metastatic disease. - Left upper lobe biopsy (09/11/2023): Invasive moderately differentiated squamous cell carcinoma. - 4 cycles of neoadjuvant carboplatin , paclitaxel  and nivolumab  from 10/31/2023 through 01/02/2024 - CT chest (01/28/2021): Interval increase in size of the LUL mass 7.6 x 10.1 cm, invading left upper chest wall with destruction of the left 2nd-4th ribs.  No new masses or adenopathy. - She was seen by Dr. Kerrin and felt to be not a surgical candidate. - Combination  chemoradiation therapy started on 02/19/2024, chemotherapy with cisplatin  and etoposide  started on 02/20/2024   2. Social/Family History: -Lives at home alone and does not work. Tobacco use of 1 cigarette a day since her 20's. No chemical exposures.  -Mother had uterine cancer. 2 sisters had breast cancer.     Plan: 1.  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 - She was started on combination chemoradiation therapy with cisplatin  and etoposide  on 02/19/2024. - She did not experience any major GI side effects.  No ringing in the ears or neuropathy symptoms. - Labs today: Creatinine mildly increased to 0.62.  LFTs normal.  CBC grossly normal. - She may proceed with day 8 of cisplatin  today.  She will be scheduled for IV fluids 1 day this week.  She was told to increase oral fluid intake. - She will see Dr. Sherrod on 03/07/2024 to transfer her care as it is more convenient for her niece to transport her.   2.  Left chest wall pain: - She had occasional left anterior chest wall pain mostly at nighttime which has improved since start of therapy.  She is continuing Percocet 10/325 mg at bedtime as needed.   3.  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  But they were put on back burner due to treatments for her lung cancer.  4.  Decreased appetite: - Continue Megace  twice daily.  She  is also drinking Ensure daily.  Weight is stable.  5.  Hypokalemia: - Continue K-Dur 20 milliequivalents daily.  Potassium is 3.4.    No orders of the defined types were placed in this encounter.     [name],acting as a Neurosurgeon for Alean Stands, MD.,have documented all relevant documentation on the behalf of Alean Stands, MD,as directed by  Alean Stands, MD while in the presence of Alean Stands, MD.  I, Alean Stands  MD, have reviewed the above documentation for accuracy and completeness, and I agree with the above.      Alean Stands, MD   7/15/202512:59 PM  CHIEF COMPLAINT:   Diagnosis: LUL squamous cell lung cancer   Cancer Staging  Primary squamous cell carcinoma of upper lobe of left lung [facility]) Staging form: Lung, AJCC V9 - Clinical stage from 09/20/2023: Stage IIB (cT3, cN0, cM0) - Unsigned    Prior Therapy: Neoadjuvant chemoimmunotherapy  Current Therapy: Concurrent chemoradiation therapy   HISTORY OF PRESENT ILLNESS:   Oncology History  Primary squamous cell carcinoma of upper lobe of left lung (HCC)  09/20/2023 Initial Diagnosis   Primary squamous cell carcinoma of upper lobe of left lung (HCC)   10/31/2023 - 01/04/2024 Chemotherapy   Patient is on Treatment Plan : LUNG Opdivo  + Carboplatin  + Paclitaxel  q21d     02/20/2024 -  Chemotherapy   Patient is on Treatment Plan : LUNG Cisplatin  (50) D1,8 + Etoposide  (50) D1-5 q28d + XRT        INTERVAL HISTORY:   Denise Rivas is a 63 y.o. female presenting to clinic today for follow up of LUL squamous cell lung cancer. She was last seen by me on 02/20/24.  Today, she states that she is doing well overall. Her appetite level is at 100%. Her energy level is at 100%. Lianne denies any tinnitus, nausea, vomiting, or peripheral neuropathy due to treatment. She drinks two 12 ounce bottles of water a day.   She is tolerating radiation well and states left chest wall pain is stable. She requires Percocet nightly to improve sleep. She is taking Megace  as prescribed and drinking Ensure daily.   PAST MEDICAL HISTORY:   Past Medical History: Past Medical History:  Diagnosis Date   Anxiety    Hypercholesteremia    Hypertension    Lung cancer (HCC)    Neurofibromatosis, type 1 [facility])    saw Dermatology [facility] 2023    Surgical History: Past Surgical History:  Procedure Laterality Date   ABDOMINAL HYSTERECTOMY     PORTACATH PLACEMENT Right  10/12/2023   Procedure: INSERTION PORT-A-CATH;  Surgeon: Kallie Manuelita BROCKS, MD;  Location: [facility];  Service: General;  Laterality: Right;    Social History: Social History   Socioeconomic History   Marital status: Single    Spouse name: Not on file   Number of children: Not on file   Years of education: Not on file   Highest education level: Not on file  Occupational History   Not on file  Tobacco Use   Smoking status: Former    Types: Cigarettes   Smokeless tobacco: Never  Vaping Use   Vaping status: Never Used  Substance and Sexual Activity   Alcohol use: Not Currently   Drug use: Never   Sexual activity: Not on file  Other Topics Concern   Not on file  Social History Narrative   Not on file   Social Drivers of Health   Financial Resource Strain: Not on file  Food Insecurity: Not on file  Transportation Needs: Not on file  Physical Activity: Not on file  Stress: Not on file  Social Connections: Unknown (06/14/2022)   Received from [facility]   Social Network    Social Network: Not on file  Intimate Partner Violence: Unknown (06/14/2022)   Received from [facility]   HITS    Physically Hurt: Not on file    Insult or Talk Down To: Not on file    Threaten Physical Harm: Not on file    Scream or Curse: Not on file    Family History: No family history on file.  Current Medications:  Current Outpatient Medications:    amLODipine (NORVASC) 5 MG tablet, Take 5 mg by mouth in the morning., Disp: , Rfl:    aspirin EC 81 MG tablet, Take 81 mg by mouth in the morning., Disp: , Rfl:    fluticasone (FLONASE) 50 MCG/ACT nasal spray, Place 1 spray into both nostrils daily as needed for allergies., Disp: , Rfl:    Fosaprepitant  Dimeglumine (EMEND IV), Inject into the vein., Disp: , Rfl:    gabapentin  (NEURONTIN ) 300 MG capsule, Take 1 capsule (300 mg total) by mouth See admin instructions. Take 1 capsule (300 mg) by mouth scheduled at bedtime, may take up to 2  additional dose if needed for pain., Disp: 90 capsule, Rfl: 11   hydrOXYzine (ATARAX) 25 MG tablet, Take 25 mg by mouth daily as needed for anxiety., Disp: , Rfl:    ibuprofen (ADVIL) 200 MG tablet, Take 400 mg by mouth every 8 (eight) hours as needed (pain)., Disp: , Rfl:    Lactulose  20 GM/30ML SOLN, Take 30 mLs (20 g total) by mouth daily as needed. Take 30 ml by mouth every 3 hours until you have a bowel movement, then use daily as needed for constipation, Disp: 450 mL, Rfl: 1   lidocaine -prilocaine  (EMLA ) cream, Apply 1 Application topically as needed., Disp: 30 g, Rfl: 0   megestrol  (MEGACE ) 40 MG/ML suspension, Take 10 mLs (400 mg total) by mouth 2 (two) times daily., Disp: 480 mL, Rfl: 2   mirtazapine  (REMERON  SOL-TAB) 45 MG disintegrating tablet, Take 1 tablet (45 mg total) by mouth at bedtime., Disp: 30 tablet, Rfl: 2   naproxen sodium (ALEVE) 220 MG tablet, Take 220-440 mg by mouth 2 (two) times daily as needed (pain.)., Disp: , Rfl:    ondansetron  (ZOFRAN ) 4 MG tablet, Take 1 tablet (4 mg total) by mouth every 8 (eight) hours as needed., Disp: 30 tablet, Rfl: 1   oxyCODONE -acetaminophen  (PERCOCET) 10-325 MG tablet, Take 1 tablet by mouth every 12 (twelve) hours as needed for pain., Disp: 60 tablet, Rfl: 0   Palonosetron  HCl (ALOXI  IV), Inject into the vein., Disp: , Rfl:    potassium chloride  SA (KLOR-CON  M) 20 MEQ tablet, Take 1 tablet (20 mEq total) by mouth daily., Disp: 30 tablet, Rfl: 5   pravastatin (PRAVACHOL) 40 MG tablet, Take 40 mg by mouth daily., Disp: , Rfl:    sertraline (ZOLOFT) 100 MG tablet, Take 100 mg by mouth in the morning., Disp: , Rfl:  No current facility-administered medications for this visit.  Facility-Administered Medications Ordered in Other Visits:    0.9 %  sodium chloride  infusion, , Intravenous, Continuous, Rogers Hai, MD, Last Rate: 10 mL/hr at 02/27/24 0855, New Bag at 02/27/24 0855   heparin  lock flush 100 unit/mL, 500 Units, Intracatheter,  Once PRN, Lala Been, MD   sodium chloride  flush (NS) 0.9 %  injection 10 mL, 10 mL, Intracatheter, PRN, Yaeko Fazekas, MD   Allergies: No Known Allergies  REVIEW OF SYSTEMS:   Review of Systems  Constitutional:  Negative for chills, fatigue and fever.  HENT:   Negative for lump/mass, mouth sores, nosebleeds, sore throat and trouble swallowing.   Eyes:  Negative for eye problems.  Respiratory:  Negative for cough and shortness of breath.   Cardiovascular:  Negative for chest pain, leg swelling and palpitations.  Gastrointestinal:  Negative for abdominal pain, constipation, diarrhea, nausea and vomiting.  Genitourinary:  Negative for bladder incontinence, difficulty urinating, dysuria, frequency, hematuria and nocturia.   Musculoskeletal:  Negative for arthralgias, back pain, flank pain, myalgias and neck pain.  Skin:  Negative for itching and rash.  Neurological:  Negative for dizziness, headaches and numbness.  Hematological:  Does not bruise/bleed easily.  Psychiatric/Behavioral:  Negative for depression, sleep disturbance and suicidal ideas. The patient is not nervous/anxious.   All other systems reviewed and are negative.    VITALS:   There were no vitals taken for this visit.  Wt Readings from Last 3 Encounters:  02/27/24 125 lb (56.7 kg)  02/20/24 127 lb 13.9 oz (58 kg)  02/08/24 129 lb (58.5 kg)    There is no height or weight on file to calculate BMI.  Performance status (ECOG): 1 - Symptomatic but completely ambulatory  PHYSICAL EXAM:   Physical Exam Vitals and nursing note reviewed. Exam conducted with a chaperone present.  Constitutional:      Appearance: Normal appearance.  Cardiovascular:     Rate and Rhythm: Normal rate and regular rhythm.     Pulses: Normal pulses.     Heart sounds: Normal heart sounds.  Pulmonary:     Effort: Pulmonary effort is normal.     Breath sounds: Normal breath sounds.  Abdominal:     Palpations: Abdomen is  soft. There is no hepatomegaly, splenomegaly or mass.     Tenderness: There is no abdominal tenderness.  Musculoskeletal:     Right lower leg: No edema.     Left lower leg: No edema.  Lymphadenopathy:     Cervical: No cervical adenopathy.     Right cervical: No superficial, deep or posterior cervical adenopathy.    Left cervical: No superficial, deep or posterior cervical adenopathy.     Upper Body:     Right upper body: No supraclavicular or axillary adenopathy.     Left upper body: No supraclavicular or axillary adenopathy.  Neurological:     General: No focal deficit present.     Mental Status: She is alert and oriented to person, place, and time.  Psychiatric:        Mood and Affect: Mood normal.        Behavior: Behavior normal.     LABS:   CBC     Component Value Date/Time   WBC 8.3 02/27/2024 0809   RBC 3.77 (L) 02/27/2024 0809   HGB 12.2 02/27/2024 0809   HCT 38.1 02/27/2024 0809   PLT 364 02/27/2024 0809   MCV 101.1 (H) 02/27/2024 0809   MCH 32.4 02/27/2024 0809   MCHC 32.0 02/27/2024 0809   RDW 12.4 02/27/2024 0809   LYMPHSABS 0.6 (L) 02/27/2024 0809   MONOABS 0.2 02/27/2024 0809   EOSABS 0.1 02/27/2024 0809   BASOSABS 0.0 02/27/2024 0809    CMP      Component Value Date/Time   NA 137 02/27/2024 0809   K 3.4 (L) 02/27/2024 0809   CL  102 02/27/2024 0809   CO2 21 (L) 02/27/2024 0809   GLUCOSE 93 02/27/2024 0809   BUN 17 02/27/2024 0809   CREATININE 0.62 02/27/2024 0809   CALCIUM 9.4 02/27/2024 0809   PROT 7.3 02/27/2024 0809   ALBUMIN 2.9 (L) 02/27/2024 0809   AST 13 (L) 02/27/2024 0809   ALT 19 02/27/2024 0809   ALKPHOS 54 02/27/2024 0809   BILITOT 0.8 02/27/2024 0809   GFRNONAA >60 02/27/2024 0809   GFRAA >60 02/18/2020 0932     No results found for: CEA1, CEA / No results found for: CEA1, CEA No results found for: PSA1 No results found for: CAN199 No results found for: CAN125  No results found for: TOTALPROTELP,  ALBUMINELP, A1GS, A2GS, BETS, BETA2SER, GAMS, MSPIKE, SPEI No results found for: TIBC, FERRITIN, IRONPCTSAT No results found for: LDH   STUDIES:   CT CHEST W CONTRAST Result Date: 01/29/2024 CLINICAL DATA:  Non-small cell lung cancer (NSCLC), non-metastatic, assess treatment response. * Tracking Code: BO * EXAM: CT CHEST WITH CONTRAST TECHNIQUE: Multidetector CT imaging of the chest was performed during intravenous contrast administration. RADIATION DOSE REDUCTION: This exam was performed according to the departmental dose-optimization program which includes automated exposure control, adjustment of the mA and/or kV according to patient size and/or use of iterative reconstruction technique. CONTRAST:  75mL OMNIPAQUE  IOHEXOL  300 MG/ML  SOLN COMPARISON:  CT scan chest from 07/23/2023. FINDINGS: Cardiovascular: Normal cardiac size. No pericardial effusion. No aortic aneurysm. There are moderate peripheral atherosclerotic vascular calcifications of thoracic aorta and its major branches. Mediastinum/Nodes: Visualized thyroid  gland appears grossly unremarkable. No solid / cystic mediastinal masses. The esophagus is nondistended precluding optimal assessment. There are few mildly prominent mediastinal and hilar lymph nodes, which do not meet the size criteria for lymphadenopathy and appear grossly similar to the prior study, favoring benign etiology. No axillary lymphadenopathy by size criteria. Lungs/Pleura: The central tracheo-bronchial tree is patent. There mild-to-moderate upper lobe predominant emphysematous changes. There is continued interval increase in the large heterogeneous mass centered in the left upper lobe, anteriorly currently measuring 7.6 x 10.1 cm. The mass is invading the left upper anterior chest wall and there is destruction of left second through fourth ribs anteromedial ance. There is also loss of fat planes between the mass and the left pectoralis major muscle as  well as intercostal muscles. There is small area of air within the mass, suggesting central cavitation/necrosis. The mass also abuts the prevascular fat however, there is minimal fat plane preserved between the mass and the aortic arch. Anteriorly the mass also is in close proximity to the left-sided internal mammary vessels without occlusion. No new mass or consolidation. No pleural effusion or pneumothorax. Redemonstration of stable, sub 6 mm calcified and noncalcified nodules, 1 each in bilateral lungs, in the left lung - upper lobe (series 2005, image 37), and in the right lung - middle lobe series 2005, image 95). No new or suspicious lung nodule. Upper Abdomen: Surgically absent gallbladder. There is a simple cyst in the right kidney upper pole measuring up to 1.9 x 2.0 cm. Remaining visualized upper abdominal viscera within normal limits. Musculoskeletal: A CT Port-a-Cath is seen in the right upper chest wall with the catheter terminating in the lower portion of superior vena cava. Visualized soft tissues of the chest wall are otherwise grossly unremarkable. No suspicious osseous lesions. There are mild multilevel degenerative changes in the visualized spine. IMPRESSION: 1. Continued interval increase in the size of large heterogeneous mass centered in the  left upper lobe, currently measuring 7.6 x 10.1 cm. The mass is invading the left upper anterior chest wall with destruction of left second through fourth ribs anteromedially. Please see above for details. 2. No new lung mass or consolidation. No new or suspicious lung nodule. 3. No mediastinal or hilar lymphadenopathy by size criteria. 4. Multiple other nonacute observations, as described above. Aortic Atherosclerosis (ICD10-I70.0) and Emphysema (ICD10-J43.9). Electronically Signed   By: Ree Molt M.D.   On: 01/29/2024 10:00

## 2024-02-27 NOTE — Progress Notes (Signed)
 Patient presents today for chemotherapy infusion. Patient is in satisfactory condition with no new complaints voiced.  Vital signs are stable.  Labs reviewed by Dr. Rogers during the office visit and all labs are within treatment parameters.  Potassium today is 3.4.  Patient will receive 20 mEq of potassium in house IVF today.  We will proceed with treatment per MD orders.   300 mL of urine collected prior to treatment.   Patient tolerated treatment well with no complaints voiced.  Patient left ambulatory in stable condition.  Vital signs stable at discharge.  Follow up as scheduled.

## 2024-02-27 NOTE — Patient Instructions (Signed)
 [facility] - A DEPT OF [facility]  Discharge Instructions: Thank you for choosing [facility] to provide your oncology and hematology care.  If you have a lab appointment with the Cancer Center - please note that after April 8th, 2024, all labs will be drawn in the cancer center.  You do not have to check in or register with the main entrance as you have in the past but will complete your check-in in the cancer center.  Wear comfortable clothing and clothing appropriate for easy access to any Portacath or PICC line.   We strive to give you quality time with your provider. You may need to reschedule your appointment if you arrive late (15 or more minutes).  Arriving late affects you and other patients whose appointments are after yours.  Also, if you miss three or more appointments without notifying the office, you may be dismissed from the clinic at the provider's discretion.      For prescription refill requests, have your pharmacy contact our office and allow 72 hours for refills to be completed.    Today you received the following chemotherapy and/or immunotherapy agents Cisplatin .  Cisplatin  Injection What is this medication? CISPLATIN  (SIS pla tin) treats some types of cancer. It works by slowing down the growth of cancer cells. This medicine may be used for other purposes; ask your health care provider or pharmacist if you have questions. COMMON BRAND NAME(S): Platinol , Platinol  -AQ What should I tell my care team before I take this medication? They need to know if you have any of these conditions: Eye disease, vision problems Hearing problems Kidney disease Low blood counts, such as low white cells, platelets, or red blood cells Tingling of the fingers or toes, or other nerve disorder An unusual or allergic reaction to cisplatin , carboplatin , oxaliplatin, other medications, foods, dyes, or preservatives If you or your partner are  pregnant or trying to get pregnant Breast-feeding How should I use this medication? This medication is injected into a vein. It is given by your care team in a hospital or clinic setting. Talk to your care team about the use of this medication in children. Special care may be needed. Overdosage: If you think you have taken too much of this medicine contact a poison control center or emergency room at once. NOTE: This medicine is only for you. Do not share this medicine with others. What if I miss a dose? Keep appointments for follow-up doses. It is important not to miss your dose. Call your care team if you are unable to keep an appointment. What may interact with this medication? Do not take this medication with any of the following: Live virus vaccines This medication may also interact with the following: Certain antibiotics, such as amikacin, gentamicin, neomycin, polymyxin B, streptomycin, tobramycin, vancomycin Foscarnet This list may not describe all possible interactions. Give your health care provider a list of all the medicines, herbs, non-prescription drugs, or dietary supplements you use. Also tell them if you smoke, drink alcohol, or use illegal drugs. Some items may interact with your medicine. What should I watch for while using this medication? Your condition will be monitored carefully while you are receiving this medication. You may need blood work done while taking this medication. This medication may make you feel generally unwell. This is not uncommon, as chemotherapy can affect healthy cells as well as cancer cells. Report any side effects. Continue your course of treatment  even though you feel ill unless your care team tells you to stop. This medication may increase your risk of getting an infection. Call your care team for advice if you get a fever, chills, sore throat, or other symptoms of a cold or flu. Do not treat yourself. Try to avoid being around people who are  sick. Avoid taking medications that contain aspirin, acetaminophen , ibuprofen, naproxen, or ketoprofen unless instructed by your care team. These medications may hide a fever. This medication may increase your risk to bruise or bleed. Call your care team if you notice any unusual bleeding. Be careful brushing or flossing your teeth or using a toothpick because you may get an infection or bleed more easily. If you have any dental work done, tell your dentist you are receiving this medication. Drink fluids as directed while you are taking this medication. This will help protect your kidneys. Call your care team if you get diarrhea. Do not treat yourself. Talk to your care team if you or your partner wish to become pregnant or think you might be pregnant. This medication can cause serious birth defects if taken during pregnancy and for 14 months after the last dose. A negative pregnancy test is required before starting this medication. A reliable form of contraception is recommended while taking this medication and for 14 months after the last dose. Talk to your care team about effective forms of contraception. Do not father a child while taking this medication and for 11 months after the last dose. Use a condom during sex during this time period. Do not breast-feed while taking this medication. This medication may cause infertility. Talk to your care team if you are concerned about your fertility. What side effects may I notice from receiving this medication? Side effects that you should report to your care team as soon as possible: Allergic reactions--skin rash, itching, hives, swelling of the face, lips, tongue, or throat Eye pain, change in vision, vision loss Hearing loss, ringing in ears Infection--fever, chills, cough, sore throat, wounds that don't heal, pain or trouble when passing urine, general feeling of discomfort or being unwell Kidney injury--decrease in the amount of urine, swelling of  the ankles, hands, or feet Low red blood cell level--unusual weakness or fatigue, dizziness, headache, trouble breathing Painful swelling, warmth, or redness of the skin, blisters or sores at the infusion site Pain, tingling, or numbness in the hands or feet Unusual bruising or bleeding Side effects that usually do not require medical attention (report to your care team if they continue or are bothersome): Hair loss Nausea Vomiting This list may not describe all possible side effects. Call your doctor for medical advice about side effects. You may report side effects to FDA at 1-800-FDA-1088. Where should I keep my medication? This medication is given in a hospital or clinic. It will not be stored at home. NOTE: This sheet is a summary. It may not cover all possible information. If you have questions about this medicine, talk to your doctor, pharmacist, or health care provider.  2024 Elsevier/Gold Standard (2021-12-03 00:00:00)       To help prevent nausea and vomiting after your treatment, we encourage you to take your nausea medication as directed.  BELOW ARE SYMPTOMS THAT SHOULD BE REPORTED IMMEDIATELY: *FEVER GREATER THAN 100.4 F (38 C) OR HIGHER *CHILLS OR SWEATING *NAUSEA AND VOMITING THAT IS NOT CONTROLLED WITH YOUR NAUSEA MEDICATION *UNUSUAL SHORTNESS OF BREATH *UNUSUAL BRUISING OR BLEEDING *URINARY PROBLEMS (pain or burning when urinating,  or frequent urination) *BOWEL PROBLEMS (unusual diarrhea, constipation, pain near the anus) TENDERNESS IN MOUTH AND THROAT WITH OR WITHOUT PRESENCE OF ULCERS (sore throat, sores in mouth, or a toothache) UNUSUAL RASH, SWELLING OR PAIN  UNUSUAL VAGINAL DISCHARGE OR ITCHING   Items with * indicate a potential emergency and should be followed up as soon as possible or go to the Emergency Department if any problems should occur.  Please show the CHEMOTHERAPY ALERT CARD or IMMUNOTHERAPY ALERT CARD at check-in to the Emergency Department and  triage nurse.  Should you have questions after your visit or need to cancel or reschedule your appointment, please contact [facility] [place] - A DEPT OF [facility] [phone]  and follow the prompts.  Office hours are 8:00 a.m. to 4:30 p.m. Monday - Friday. Please note that voicemails left after 4:00 p.m. may not be returned until the following business day.  We are closed weekends and major holidays. You have access to a nurse at all times for urgent questions. Please call the main number to the clinic [phone] and follow the prompts.  For any non-urgent questions, you may also contact your provider using MyChart. We now offer e-Visits for anyone 64 and older to request care online for non-urgent symptoms. For details visit [email or web address].   Also download the MyChart app! Go to the app store, search MyChart, open the app, select [facility], and log in with your MyChart username and password.

## 2024-02-27 NOTE — Patient Instructions (Addendum)
 [facility] at [facility] Discharge Instructions   You were seen and examined today by Dr. Rogers.  He reviewed the results of your lab work which are normal/stable.   We will proceed with your treatment today.   Increase your water intake to 6-8 bottles of water daily (about 70 ounces).   Return as scheduled.    Thank you for choosing [facility] at [facility] to provide your oncology and hematology care.  To afford each patient quality time with our provider, please arrive at least 15 minutes before your scheduled appointment time.   If you have a lab appointment with the Cancer Center please come in thru the Main Entrance and check in at the main information desk.  You need to re-schedule your appointment should you arrive 10 or more minutes late.  We strive to give you quality time with our providers, and arriving late affects you and other patients whose appointments are after yours.  Also, if you no show three or more times for appointments you may be dismissed from the clinic at the providers discretion.     Again, thank you for choosing [facility].  Our hope is that these requests will decrease the amount of time that you wait before being seen by our physicians.       _____________________________________________________________  Should you have questions after your visit to [facility], please contact our office at [phone] and follow the prompts.  Our office hours are 8:00 a.m. and 4:30 p.m. Monday - Friday.  Please note that voicemails left after 4:00 p.m. may not be returned until the following business day.  We are closed weekends and major holidays.  You do have access to a nurse 24-7, just call the main number to the clinic [phone] and do not press any options, hold on the line and a nurse will answer the phone.    For prescription refill requests, have your pharmacy contact our office and  allow 72 hours.    Due to Covid, you will need to wear a mask upon entering the hospital. If you do not have a mask, a mask will be given to you at the Main Entrance upon arrival. For doctor visits, patients may have 1 support person age 36 or older with them. For treatment visits, patients can not have anyone with them due to social distancing guidelines and our immunocompromised population.

## 2024-02-27 NOTE — Progress Notes (Signed)
 Patient has been examined by Dr. Ellin Saba. Vital signs and labs have been reviewed by MD - ANC, Creatinine, LFTs, hemoglobin, and platelets are within treatment parameters per M.D. - pt may proceed with treatment.  Primary RN and pharmacy notified.

## 2024-02-28 ENCOUNTER — Other Ambulatory Visit: Payer: Self-pay

## 2024-02-28 ENCOUNTER — Ambulatory Visit
Admission: RE | Admit: 2024-02-28 | Discharge: 2024-02-28 | Disposition: A | Discharge status: Home / Self Care | Source: Ambulatory Visit | Attending: Radiation Oncology

## 2024-02-28 ENCOUNTER — Inpatient Hospital Stay

## 2024-02-28 VITALS — BP 123/85 | HR 106 | Temp 96.7°F | Resp 18

## 2024-02-28 DIAGNOSIS — C3412 Malignant neoplasm of upper lobe, left bronchus or lung: Secondary | ICD-10-CM

## 2024-02-28 LAB — RAD ONC ARIA SESSION SUMMARY
Course Elapsed Days: 9
Plan Fractions Treated to Date: 8
Plan Prescribed Dose Per Fraction: 2 Gy
Plan Total Fractions Prescribed: 30
Plan Total Prescribed Dose: 60 Gy
Reference Point Dosage Given to Date: 16 Gy
Reference Point Session Dosage Given: 2 Gy
Session Number: 8

## 2024-02-28 MED ORDER — SODIUM CHLORIDE 0.9% FLUSH
10.0000 mL | Freq: Once | INTRAVENOUS | Status: AC | PRN
  Administered 2024-02-28: 10 mL

## 2024-02-28 MED ORDER — MAGNESIUM SULFATE 2 GM/50ML IV SOLN
2.0000 g | Freq: Once | INTRAVENOUS | Status: AC
  Administered 2024-02-28: 2 g via INTRAVENOUS
  Filled 2024-02-28: qty 50

## 2024-02-28 MED ORDER — HEPARIN SOD (PORK) LOCK FLUSH 100 UNIT/ML IV SOLN
500.0000 [IU] | Freq: Once | INTRAVENOUS | Status: AC | PRN
  Administered 2024-02-28: 500 [IU]

## 2024-02-28 MED ORDER — POTASSIUM CHLORIDE IN NACL 20-0.9 MEQ/L-% IV SOLN
Freq: Once | INTRAVENOUS | Status: AC
  Filled 2024-02-28: qty 1000

## 2024-02-28 NOTE — Progress Notes (Signed)
 Patient presents today for IVF.  Patient is in satisfactory condition with no new complaints voiced.  Vital signs are stable.  We will proceed with infusion per provider orders.    Patient tolerated IVFwell with no complaints voiced.  Patient left ambulatory in stable condition.  Vital signs stable at discharge.  Follow up as scheduled.

## 2024-02-28 NOTE — Patient Instructions (Signed)

## 2024-02-29 ENCOUNTER — Ambulatory Visit
Admission: RE | Admit: 2024-02-29 | Discharge: 2024-02-29 | Disposition: A | Discharge status: Home / Self Care | Source: Ambulatory Visit | Attending: Radiation Oncology

## 2024-02-29 ENCOUNTER — Ambulatory Visit
Admission: RE | Admit: 2024-02-29 | Discharge: 2024-02-29 | Disposition: A | Discharge status: Home / Self Care | Source: Ambulatory Visit | Attending: Radiation Oncology | Admitting: Radiation Oncology

## 2024-02-29 ENCOUNTER — Other Ambulatory Visit: Payer: Self-pay

## 2024-02-29 DIAGNOSIS — C3412 Malignant neoplasm of upper lobe, left bronchus or lung: Secondary | ICD-10-CM | POA: Diagnosis not present

## 2024-02-29 LAB — RAD ONC ARIA SESSION SUMMARY
Course Elapsed Days: 10
Plan Fractions Treated to Date: 9
Plan Prescribed Dose Per Fraction: 2 Gy
Plan Total Fractions Prescribed: 30
Plan Total Prescribed Dose: 60 Gy
Reference Point Dosage Given to Date: 18 Gy
Reference Point Session Dosage Given: 2 Gy
Session Number: 9

## 2024-03-01 ENCOUNTER — Ambulatory Visit
Admission: RE | Admit: 2024-03-01 | Discharge: 2024-03-01 | Disposition: A | Discharge status: Home / Self Care | Source: Ambulatory Visit | Attending: Radiation Oncology | Admitting: Radiation Oncology

## 2024-03-01 ENCOUNTER — Other Ambulatory Visit: Payer: Self-pay

## 2024-03-01 ENCOUNTER — Ambulatory Visit

## 2024-03-01 DIAGNOSIS — C3412 Malignant neoplasm of upper lobe, left bronchus or lung: Secondary | ICD-10-CM | POA: Diagnosis not present

## 2024-03-01 LAB — RAD ONC ARIA SESSION SUMMARY
Course Elapsed Days: 11
Plan Fractions Treated to Date: 10
Plan Prescribed Dose Per Fraction: 2 Gy
Plan Total Fractions Prescribed: 30
Plan Total Prescribed Dose: 60 Gy
Reference Point Dosage Given to Date: 20 Gy
Reference Point Session Dosage Given: 2 Gy
Session Number: 10

## 2024-03-04 ENCOUNTER — Other Ambulatory Visit: Payer: Self-pay

## 2024-03-04 ENCOUNTER — Ambulatory Visit
Admission: RE | Admit: 2024-03-04 | Discharge: 2024-03-04 | Disposition: A | Discharge status: Home / Self Care | Source: Ambulatory Visit | Attending: Radiation Oncology | Admitting: Radiation Oncology

## 2024-03-04 DIAGNOSIS — C3412 Malignant neoplasm of upper lobe, left bronchus or lung: Secondary | ICD-10-CM | POA: Diagnosis not present

## 2024-03-04 LAB — RAD ONC ARIA SESSION SUMMARY
Course Elapsed Days: 14
Plan Fractions Treated to Date: 11
Plan Prescribed Dose Per Fraction: 2 Gy
Plan Total Fractions Prescribed: 30
Plan Total Prescribed Dose: 60 Gy
Reference Point Dosage Given to Date: 22 Gy
Reference Point Session Dosage Given: 2 Gy
Session Number: 11

## 2024-03-05 ENCOUNTER — Other Ambulatory Visit: Payer: Self-pay

## 2024-03-05 ENCOUNTER — Other Ambulatory Visit: Payer: Self-pay | Admitting: Physician Assistant

## 2024-03-05 ENCOUNTER — Ambulatory Visit
Admission: RE | Admit: 2024-03-05 | Discharge: 2024-03-05 | Disposition: A | Discharge status: Home / Self Care | Source: Ambulatory Visit | Attending: Radiation Oncology | Admitting: Radiation Oncology

## 2024-03-05 DIAGNOSIS — C3412 Malignant neoplasm of upper lobe, left bronchus or lung: Secondary | ICD-10-CM

## 2024-03-05 LAB — RAD ONC ARIA SESSION SUMMARY
Course Elapsed Days: 15
Plan Fractions Treated to Date: 12
Plan Prescribed Dose Per Fraction: 2 Gy
Plan Total Fractions Prescribed: 30
Plan Total Prescribed Dose: 60 Gy
Reference Point Dosage Given to Date: 24 Gy
Reference Point Session Dosage Given: 2 Gy
Session Number: 12

## 2024-03-06 ENCOUNTER — Telehealth: Payer: Self-pay | Admitting: Radiation Oncology

## 2024-03-06 ENCOUNTER — Other Ambulatory Visit: Payer: Self-pay

## 2024-03-06 ENCOUNTER — Ambulatory Visit: Admitting: Internal Medicine

## 2024-03-06 ENCOUNTER — Inpatient Hospital Stay

## 2024-03-06 ENCOUNTER — Ambulatory Visit
Admission: RE | Admit: 2024-03-06 | Discharge: 2024-03-06 | Disposition: A | Discharge status: Home / Self Care | Source: Ambulatory Visit | Attending: Radiation Oncology | Admitting: Radiation Oncology

## 2024-03-06 DIAGNOSIS — C3412 Malignant neoplasm of upper lobe, left bronchus or lung: Secondary | ICD-10-CM

## 2024-03-06 LAB — CBC WITH DIFFERENTIAL (CANCER CENTER ONLY)
Abs Immature Granulocytes: 0.02 K/uL (ref 0.00–0.07)
Basophils Absolute: 0 K/uL (ref 0.0–0.1)
Basophils Relative: 1 %
Eosinophils Absolute: 0 K/uL (ref 0.0–0.5)
Eosinophils Relative: 0 %
HCT: 32.3 % — ABNORMAL LOW (ref 36.0–46.0)
Hemoglobin: 10.8 g/dL — ABNORMAL LOW (ref 12.0–15.0)
Immature Granulocytes: 1 %
Lymphocytes Relative: 19 %
Lymphs Abs: 0.6 K/uL — ABNORMAL LOW (ref 0.7–4.0)
MCH: 32.6 pg (ref 26.0–34.0)
MCHC: 33.4 g/dL (ref 30.0–36.0)
MCV: 97.6 fL (ref 80.0–100.0)
Monocytes Absolute: 0.8 K/uL (ref 0.1–1.0)
Monocytes Relative: 24 %
Neutro Abs: 1.7 K/uL (ref 1.7–7.7)
Neutrophils Relative %: 55 %
Platelet Count: 209 K/uL (ref 150–400)
RBC: 3.31 MIL/uL — ABNORMAL LOW (ref 3.87–5.11)
RDW: 13.1 % (ref 11.5–15.5)
WBC Count: 3.1 K/uL — ABNORMAL LOW (ref 4.0–10.5)
nRBC: 0 % (ref 0.0–0.2)

## 2024-03-06 LAB — RAD ONC ARIA SESSION SUMMARY
Course Elapsed Days: 16
Plan Fractions Treated to Date: 13
Plan Prescribed Dose Per Fraction: 2 Gy
Plan Total Fractions Prescribed: 30
Plan Total Prescribed Dose: 60 Gy
Reference Point Dosage Given to Date: 26 Gy
Reference Point Session Dosage Given: 2 Gy
Session Number: 13

## 2024-03-06 LAB — CMP (CANCER CENTER ONLY)
ALT: 29 U/L (ref 0–44)
AST: 17 U/L (ref 15–41)
Albumin: 3.4 g/dL — ABNORMAL LOW (ref 3.5–5.0)
Alkaline Phosphatase: 56 U/L (ref 38–126)
Anion gap: 9 (ref 5–15)
BUN: 11 mg/dL (ref 8–23)
CO2: 24 mmol/L (ref 22–32)
Calcium: 9.4 mg/dL (ref 8.9–10.3)
Chloride: 105 mmol/L (ref 98–111)
Creatinine: 0.6 mg/dL (ref 0.44–1.00)
GFR, Estimated: >60 mL/min (ref >60)
Glucose, Bld: 106 mg/dL — ABNORMAL HIGH (ref 70–99)
Potassium: 3.7 mmol/L (ref 3.5–5.1)
Sodium: 138 mmol/L (ref 135–145)
Total Bilirubin: 0.3 mg/dL (ref 0.0–1.2)
Total Protein: 7.4 g/dL (ref 6.5–8.1)

## 2024-03-06 LAB — MAGNESIUM: Magnesium: 1.7 mg/dL (ref 1.7–2.4)

## 2024-03-06 NOTE — Telephone Encounter (Signed)
 7/23 patient's niece call with patient on the other line. She received call that all her treatment appts has been cancel. Just wanted to confirm she still have her RadOnc treatment appts and would like to be sch with MedOnc to setup for Chemo.  Called and spoke to [facility]), transfer call as requested.

## 2024-03-07 ENCOUNTER — Ambulatory Visit
Admission: RE | Admit: 2024-03-07 | Discharge: 2024-03-07 | Disposition: A | Discharge status: Home / Self Care | Source: Ambulatory Visit | Attending: Radiation Oncology

## 2024-03-07 ENCOUNTER — Other Ambulatory Visit: Payer: Self-pay

## 2024-03-07 DIAGNOSIS — C3412 Malignant neoplasm of upper lobe, left bronchus or lung: Secondary | ICD-10-CM | POA: Diagnosis not present

## 2024-03-07 LAB — RAD ONC ARIA SESSION SUMMARY
Course Elapsed Days: 17
Plan Fractions Treated to Date: 14
Plan Prescribed Dose Per Fraction: 2 Gy
Plan Total Fractions Prescribed: 30
Plan Total Prescribed Dose: 60 Gy
Reference Point Dosage Given to Date: 28 Gy
Reference Point Session Dosage Given: 2 Gy
Session Number: 14

## 2024-03-08 ENCOUNTER — Ambulatory Visit
Admission: RE | Admit: 2024-03-08 | Discharge: 2024-03-08 | Disposition: A | Discharge status: Home / Self Care | Source: Ambulatory Visit | Attending: Radiation Oncology | Admitting: Radiation Oncology

## 2024-03-08 ENCOUNTER — Other Ambulatory Visit: Payer: Self-pay

## 2024-03-08 DIAGNOSIS — C3412 Malignant neoplasm of upper lobe, left bronchus or lung: Secondary | ICD-10-CM | POA: Diagnosis not present

## 2024-03-08 LAB — RAD ONC ARIA SESSION SUMMARY
Course Elapsed Days: 18
Plan Fractions Treated to Date: 15
Plan Prescribed Dose Per Fraction: 2 Gy
Plan Total Fractions Prescribed: 30
Plan Total Prescribed Dose: 60 Gy
Reference Point Dosage Given to Date: 30 Gy
Reference Point Session Dosage Given: 2 Gy
Session Number: 15

## 2024-03-11 ENCOUNTER — Other Ambulatory Visit: Payer: Self-pay

## 2024-03-11 ENCOUNTER — Ambulatory Visit
Admission: RE | Admit: 2024-03-11 | Discharge: 2024-03-11 | Disposition: A | Discharge status: Home / Self Care | Source: Ambulatory Visit | Attending: Radiation Oncology

## 2024-03-11 DIAGNOSIS — C3412 Malignant neoplasm of upper lobe, left bronchus or lung: Secondary | ICD-10-CM | POA: Diagnosis not present

## 2024-03-11 LAB — RAD ONC ARIA SESSION SUMMARY
Course Elapsed Days: 21
Plan Fractions Treated to Date: 16
Plan Prescribed Dose Per Fraction: 2 Gy
Plan Total Fractions Prescribed: 30
Plan Total Prescribed Dose: 60 Gy
Reference Point Dosage Given to Date: 32 Gy
Reference Point Session Dosage Given: 2 Gy
Session Number: 16

## 2024-03-12 ENCOUNTER — Other Ambulatory Visit: Payer: Self-pay

## 2024-03-12 ENCOUNTER — Ambulatory Visit
Admission: RE | Admit: 2024-03-12 | Discharge: 2024-03-12 | Disposition: A | Discharge status: Home / Self Care | Source: Ambulatory Visit | Attending: Radiation Oncology

## 2024-03-12 DIAGNOSIS — C3412 Malignant neoplasm of upper lobe, left bronchus or lung: Secondary | ICD-10-CM | POA: Diagnosis not present

## 2024-03-12 LAB — RAD ONC ARIA SESSION SUMMARY
Course Elapsed Days: 22
Plan Fractions Treated to Date: 17
Plan Prescribed Dose Per Fraction: 2 Gy
Plan Total Fractions Prescribed: 30
Plan Total Prescribed Dose: 60 Gy
Reference Point Dosage Given to Date: 34 Gy
Reference Point Session Dosage Given: 2 Gy
Session Number: 17

## 2024-03-13 ENCOUNTER — Ambulatory Visit
Admission: RE | Admit: 2024-03-13 | Discharge: 2024-03-13 | Disposition: A | Discharge status: Home / Self Care | Source: Ambulatory Visit | Attending: Radiation Oncology

## 2024-03-13 ENCOUNTER — Other Ambulatory Visit: Payer: Self-pay

## 2024-03-13 DIAGNOSIS — C3412 Malignant neoplasm of upper lobe, left bronchus or lung: Secondary | ICD-10-CM | POA: Diagnosis not present

## 2024-03-13 LAB — RAD ONC ARIA SESSION SUMMARY
Course Elapsed Days: 23
Plan Fractions Treated to Date: 18
Plan Prescribed Dose Per Fraction: 2 Gy
Plan Total Fractions Prescribed: 30
Plan Total Prescribed Dose: 60 Gy
Reference Point Dosage Given to Date: 36 Gy
Reference Point Session Dosage Given: 2 Gy
Session Number: 18

## 2024-03-14 ENCOUNTER — Other Ambulatory Visit: Payer: Self-pay

## 2024-03-14 ENCOUNTER — Ambulatory Visit
Admission: RE | Admit: 2024-03-14 | Discharge: 2024-03-14 | Disposition: A | Discharge status: Home / Self Care | Source: Ambulatory Visit | Attending: Radiation Oncology | Admitting: Radiation Oncology

## 2024-03-14 DIAGNOSIS — C3412 Malignant neoplasm of upper lobe, left bronchus or lung: Secondary | ICD-10-CM | POA: Diagnosis not present

## 2024-03-14 LAB — RAD ONC ARIA SESSION SUMMARY
Course Elapsed Days: 24
Plan Fractions Treated to Date: 19
Plan Prescribed Dose Per Fraction: 2 Gy
Plan Total Fractions Prescribed: 30
Plan Total Prescribed Dose: 60 Gy
Reference Point Dosage Given to Date: 38 Gy
Reference Point Session Dosage Given: 2 Gy
Session Number: 19

## 2024-03-14 NOTE — Progress Notes (Signed)
 Navigator had a missed call from [place], pt's niece. Navigator called Rosina back at 9:42 as she was dropping the pt off for radiation. Navigator explained to [place] that she'd like to arrange an appt with Dr. Sherrod here at the [facility]. Rosina verbalized understanding.  Navigator met with pt in person in the radiation waiting room. Navigator explained to the patient that in order for her to receive her chemotherapy at our facility, she needs to see Dr. Sherrod. Pt verbalized understanding. Pt states that her niece can be contacted first when it comes to her appts. Pts niece provides her transportation. Navigator let pt know she is scheduled for labs and to see Dr. Sherrod on 8/6 after her radiation. Navigator provided the pt with a printed calendar of her upcoming appts. Pt denied additional questions.

## 2024-03-15 ENCOUNTER — Other Ambulatory Visit: Payer: Self-pay

## 2024-03-15 ENCOUNTER — Ambulatory Visit
Admission: RE | Admit: 2024-03-15 | Discharge: 2024-03-15 | Disposition: A | Discharge status: Home / Self Care | Source: Ambulatory Visit | Attending: Radiation Oncology | Admitting: Radiation Oncology

## 2024-03-15 DIAGNOSIS — C3412 Malignant neoplasm of upper lobe, left bronchus or lung: Secondary | ICD-10-CM | POA: Insufficient documentation

## 2024-03-15 LAB — RAD ONC ARIA SESSION SUMMARY
Course Elapsed Days: 25
Plan Fractions Treated to Date: 20
Plan Prescribed Dose Per Fraction: 2 Gy
Plan Total Fractions Prescribed: 30
Plan Total Prescribed Dose: 60 Gy
Reference Point Dosage Given to Date: 40 Gy
Reference Point Session Dosage Given: 2 Gy
Session Number: 20

## 2024-03-18 ENCOUNTER — Inpatient Hospital Stay

## 2024-03-18 ENCOUNTER — Inpatient Hospital Stay: Admitting: Hematology

## 2024-03-18 ENCOUNTER — Ambulatory Visit

## 2024-03-19 ENCOUNTER — Other Ambulatory Visit: Payer: Self-pay

## 2024-03-19 ENCOUNTER — Inpatient Hospital Stay

## 2024-03-19 ENCOUNTER — Ambulatory Visit
Admission: RE | Admit: 2024-03-19 | Discharge: 2024-03-19 | Disposition: A | Discharge status: Home / Self Care | Source: Ambulatory Visit | Attending: Radiation Oncology

## 2024-03-19 DIAGNOSIS — C3412 Malignant neoplasm of upper lobe, left bronchus or lung: Secondary | ICD-10-CM | POA: Diagnosis not present

## 2024-03-19 LAB — RAD ONC ARIA SESSION SUMMARY
Course Elapsed Days: 29
Plan Fractions Treated to Date: 21
Plan Prescribed Dose Per Fraction: 2 Gy
Plan Total Fractions Prescribed: 30
Plan Total Prescribed Dose: 60 Gy
Reference Point Dosage Given to Date: 42 Gy
Reference Point Session Dosage Given: 2 Gy
Session Number: 21

## 2024-03-20 ENCOUNTER — Inpatient Hospital Stay

## 2024-03-20 ENCOUNTER — Other Ambulatory Visit: Payer: Self-pay

## 2024-03-20 ENCOUNTER — Ambulatory Visit
Admission: RE | Admit: 2024-03-20 | Discharge: 2024-03-20 | Disposition: A | Discharge status: Home / Self Care | Source: Ambulatory Visit | Attending: Radiation Oncology | Admitting: Radiation Oncology

## 2024-03-20 ENCOUNTER — Inpatient Hospital Stay (HOSPITAL_BASED_OUTPATIENT_CLINIC_OR_DEPARTMENT_OTHER): Admitting: Internal Medicine

## 2024-03-20 VITALS — BP 129/90 | HR 112 | Temp 98.0°F | Resp 16 | Ht 61.0 in | Wt 124.0 lb

## 2024-03-20 DIAGNOSIS — C3412 Malignant neoplasm of upper lobe, left bronchus or lung: Secondary | ICD-10-CM | POA: Insufficient documentation

## 2024-03-20 DIAGNOSIS — C3490 Malignant neoplasm of unspecified part of unspecified bronchus or lung: Secondary | ICD-10-CM | POA: Diagnosis not present

## 2024-03-20 DIAGNOSIS — Z5111 Encounter for antineoplastic chemotherapy: Secondary | ICD-10-CM | POA: Insufficient documentation

## 2024-03-20 DIAGNOSIS — Z79899 Other long term (current) drug therapy: Secondary | ICD-10-CM | POA: Insufficient documentation

## 2024-03-20 LAB — CMP (CANCER CENTER ONLY)
ALT: 16 U/L (ref 0–44)
AST: 12 U/L — ABNORMAL LOW (ref 15–41)
Albumin: 3.7 g/dL (ref 3.5–5.0)
Alkaline Phosphatase: 66 U/L (ref 38–126)
Anion gap: 7 (ref 5–15)
BUN: 16 mg/dL (ref 8–23)
CO2: 26 mmol/L (ref 22–32)
Calcium: 9.8 mg/dL (ref 8.9–10.3)
Chloride: 107 mmol/L (ref 98–111)
Creatinine: 0.66 mg/dL (ref 0.44–1.00)
GFR, Estimated: >60 mL/min (ref >60)
Glucose, Bld: 94 mg/dL (ref 70–99)
Potassium: 4.2 mmol/L (ref 3.5–5.1)
Sodium: 140 mmol/L (ref 135–145)
Total Bilirubin: 0.3 mg/dL (ref 0.0–1.2)
Total Protein: 8.1 g/dL (ref 6.5–8.1)

## 2024-03-20 LAB — RAD ONC ARIA SESSION SUMMARY
Course Elapsed Days: 30
Plan Fractions Treated to Date: 22
Plan Prescribed Dose Per Fraction: 2 Gy
Plan Total Fractions Prescribed: 30
Plan Total Prescribed Dose: 60 Gy
Reference Point Dosage Given to Date: 44 Gy
Reference Point Session Dosage Given: 2 Gy
Session Number: 22

## 2024-03-20 LAB — CBC WITH DIFFERENTIAL (CANCER CENTER ONLY)
Abs Immature Granulocytes: 0.19 K/uL — ABNORMAL HIGH (ref 0.00–0.07)
Basophils Absolute: 0 K/uL (ref 0.0–0.1)
Basophils Relative: 0 %
Eosinophils Absolute: 0 K/uL (ref 0.0–0.5)
Eosinophils Relative: 0 %
HCT: 34.5 % — ABNORMAL LOW (ref 36.0–46.0)
Hemoglobin: 11.2 g/dL — ABNORMAL LOW (ref 12.0–15.0)
Immature Granulocytes: 2 %
Lymphocytes Relative: 6 %
Lymphs Abs: 0.6 K/uL — ABNORMAL LOW (ref 0.7–4.0)
MCH: 32.1 pg (ref 26.0–34.0)
MCHC: 32.5 g/dL (ref 30.0–36.0)
MCV: 98.9 fL (ref 80.0–100.0)
Monocytes Absolute: 1.7 K/uL — ABNORMAL HIGH (ref 0.1–1.0)
Monocytes Relative: 16 %
Neutro Abs: 7.6 K/uL (ref 1.7–7.7)
Neutrophils Relative %: 76 %
Platelet Count: 455 K/uL — ABNORMAL HIGH (ref 150–400)
RBC: 3.49 MIL/uL — ABNORMAL LOW (ref 3.87–5.11)
RDW: 15.3 % (ref 11.5–15.5)
WBC Count: 10.1 K/uL (ref 4.0–10.5)
nRBC: 0 % (ref 0.0–0.2)

## 2024-03-20 LAB — MAGNESIUM: Magnesium: 2 mg/dL (ref 1.7–2.4)

## 2024-03-20 NOTE — Progress Notes (Signed)
 [facility] [phone]   Fax:([phone]) [phone]  CONSULT NOTE  REFERRING PHYSICIAN: Dr. Alean Silver  REASON FOR CONSULTATION:  63 years old African-American female diagnosed with lung cancer.  HPI Denise Rivas is a 63 y.o. female.   HPI  Discussed the use of AI scribe software for clinical note transcription with the patient, who gave verbal consent to proceed.  History of Present Illness Denise Rivas is a 63 year old female with non-small cell lung cancer who presents for evaluation and treatment.  She was diagnosed with non-small cell lung cancer after presenting with left-sided chest pain on July 23, 2023. Imaging studies, including a chest x-ray and CT scan, revealed a large mass in the left upper lobe, invading the chest wall. A biopsy confirmed squamous cell carcinoma. Initial treatment included four cycles of carboplatin , paclitaxel , and nivolumab , completed by May 2025. Despite treatment, the tumor continued to grow, leading to a change in therapy to cisplatin  and etoposide  with concurrent radiation, which began on February 19, 2024.  She experiences persistent left-sided chest pain, described as 'still hurting a little bit.' No fatigue, as she states 'I still do what I got to do at home' and enjoys tending to her flowers. No nausea, vomiting, headaches, or changes in vision.  Her past medical history includes a large fibroid and neurofibromatosis. She denies diabetes, heart disease, or stroke.  Family history is significant for cancer; her mother had colon cancer, her sister had breast cancer, and her father had an unspecified cancer that started in his hand and moved to his neck.  She is a widow with one son and resides in [place],  . She has a significant smoking history, having smoked for approximately 45-50 years, and currently smokes one cigarette a day. She denies regular alcohol consumption and drug abuse.      Past Medical History:  Diagnosis Date   Anxiety    Hypercholesteremia    Hypertension    Lung cancer (HCC)    Neurofibromatosis, type 1 [facility])    saw Dermatology [facility] 2023      Past Surgical History:  Procedure Laterality Date   ABDOMINAL HYSTERECTOMY     PORTACATH PLACEMENT Right 10/12/2023   Procedure: INSERTION PORT-A-CATH;  Surgeon: Kallie Manuelita BROCKS, MD;  Location: [facility];  Service: General;  Laterality: Right;    No family history on file.  Social History Social History   Tobacco Use   Smoking status: Former    Types: Cigarettes   Smokeless tobacco: Never  Vaping Use   Vaping status: Never Used  Substance Use Topics   Alcohol use: Not Currently   Drug use: Never    No Known Allergies  Current Outpatient Medications  Medication Sig Dispense Refill   lidocaine -prilocaine  (EMLA ) cream Apply 1 Application topically as needed. 30 g 0   megestrol  (MEGACE ) 40 MG/ML suspension Take 10 mLs (400 mg total) by mouth 2 (two) times daily. 480 mL 2   mirtazapine  (REMERON  SOL-TAB) 45 MG disintegrating tablet Take 1 tablet (45 mg total) by mouth at bedtime. 30 tablet 2   oxyCODONE -acetaminophen  (PERCOCET) 10-325 MG tablet Take 1 tablet by mouth every 12 (twelve) hours as needed for pain. 60 tablet 0   amLODipine (NORVASC) 5 MG tablet Take 5 mg by mouth in the morning.     aspirin EC 81 MG tablet Take 81 mg by mouth in the morning.     fluticasone (FLONASE) 50 MCG/ACT nasal spray Place  1 spray into both nostrils daily as needed for allergies.     Fosaprepitant  Dimeglumine (EMEND IV) Inject into the vein.     gabapentin  (NEURONTIN ) 300 MG capsule Take 1 capsule (300 mg total) by mouth See admin instructions. Take 1 capsule (300 mg) by mouth scheduled at bedtime, may take up to 2 additional dose if needed for pain. 90 capsule 11   hydrOXYzine (ATARAX) 25 MG tablet Take 25 mg by mouth daily as needed for anxiety.     ibuprofen (ADVIL) 200 MG tablet Take 400 mg by mouth  every 8 (eight) hours as needed (pain). (Patient not taking: Reported on 03/20/2024)     Lactulose  20 GM/30ML SOLN Take 30 mLs (20 g total) by mouth daily as needed. Take 30 ml by mouth every 3 hours until you have a bowel movement, then use daily as needed for constipation 450 mL 1   naproxen sodium (ALEVE) 220 MG tablet Take 220-440 mg by mouth 2 (two) times daily as needed (pain.).     ondansetron  (ZOFRAN ) 4 MG tablet Take 1 tablet (4 mg total) by mouth every 8 (eight) hours as needed. 30 tablet 1   Palonosetron  HCl (ALOXI  IV) Inject into the vein.     potassium chloride  SA (KLOR-CON  M) 20 MEQ tablet Take 1 tablet (20 mEq total) by mouth daily. 30 tablet 5   pravastatin (PRAVACHOL) 40 MG tablet Take 40 mg by mouth daily.     sertraline (ZOLOFT) 100 MG tablet Take 100 mg by mouth in the morning.     No current facility-administered medications for this visit.    Review of Systems  Constitutional: positive for fatigue Eyes: negative Ears, nose, mouth, throat, and face: negative Respiratory: positive for cough Cardiovascular: negative Gastrointestinal: negative Genitourinary:negative Integument/breast: positive for skin lesion(s) Hematologic/lymphatic: negative Musculoskeletal:negative Neurological: negative Behavioral/Psych: negative Endocrine: negative Allergic/Immunologic: negative  Physical Exam  MJO:Denise Rivas, healthy, no distress, well nourished, and well developed SKIN: skin color, texture, turgor are normal, no rashes or significant lesions HEAD: Normocephalic, No masses, lesions, tenderness or abnormalities EYES: normal, PERRLA, Conjunctiva are pink and non-injected EARS: External ears normal, Canals clear OROPHARYNX:no exudate, no erythema, and lips, buccal mucosa, and tongue normal  NECK: supple, no adenopathy, no JVD LYMPH:  no palpable lymphadenopathy, no hepatosplenomegaly BREAST:not examined LUNGS: clear to auscultation , and palpation HEART: regular rate & rhythm,  no murmurs, and no gallops ABDOMEN:abdomen soft, non-tender, normal bowel sounds, and no masses or organomegaly BACK: Back symmetric, no curvature., No CVA tenderness EXTREMITIES:no joint deformities, effusion, or inflammation, no edema  NEURO: alert & oriented x 3 with fluent speech, no focal motor/sensory deficits  PERFORMANCE STATUS: ECOG 1  LABORATORY DATA: Lab Results  Component Value Date   WBC 10.1 03/20/2024   HGB 11.2 (L) 03/20/2024   HCT 34.5 (L) 03/20/2024   MCV 98.9 03/20/2024   PLT 455 (H) 03/20/2024      Chemistry      Component Value Date/Time   NA 140 03/20/2024 1028   K 4.2 03/20/2024 1028   CL 107 03/20/2024 1028   CO2 26 03/20/2024 1028   BUN 16 03/20/2024 1028   CREATININE 0.66 03/20/2024 1028      Component Value Date/Time   CALCIUM 9.8 03/20/2024 1028   ALKPHOS 66 03/20/2024 1028   AST 12 (L) 03/20/2024 1028   ALT 16 03/20/2024 1028   BILITOT 0.3 03/20/2024 1028       RADIOGRAPHIC STUDIES: No results found.  ASSESSMENT: This is a  64 years old African-American female recently diagnosed with stage IIIa (T4, N0, M0) non-small cell lung cancer, squamous cell carcinoma presented with large left upper lobe lung mass with chest wall invasion diagnosed in January 2025. The patient was treated with neoadjuvant chemoimmunotherapy with carboplatin , paclitaxel  and nivolumab  for 4 cycles with no evidence for improvement of her disease and actually she has evidence for disease progression after the treatment. She is currently undergoing a course of concurrent chemoradiation with cisplatin  50 Mg/M2 on days 1 and 8 in addition to etoposide  50 Mg on days 1-5 every 4 weeks status post 1 cycle.  She tolerated the first cycle of her treatment fairly well.    PLAN: Assessment and Plan Assessment & Plan Stage IIIA non-small cell lung cancer of the left upper lobe with chest wall invasion Stage IIIA non-small cell lung cancer in the left upper lobe with chest wall  invasion, initially diagnosed in December 2024 with a 6.4 x 4.6 x 5.2 cm mass. Biopsy confirmed squamous cell carcinoma. Previous treatment with carboplatin , paclitaxel , and nivolumab  was ineffective in reducing tumor size. Currently undergoing chemoradiation with cisplatin  and etoposide . Reports mild left-sided chest pain, but no nausea, vomiting, or fatigue. Surgery is not feasible due to tumor size and location. Complete tumor removal is unlikely without surgery. - Administer second cycle of cisplatin  and etoposide  chemotherapy on 03/25/2024. - Continue radiation therapy. - Schedule CT scan in one month to assess tumor response. - Consider immune therapy post-chemoradiation based on scan results. The patient was advised to call immediately if she has any other concerning symptoms in the interval.  The patient voices understanding of current disease status and treatment options and is in agreement with the current care plan.  All questions were answered. The patient knows to call the clinic with any problems, questions or concerns. We can certainly see the patient much sooner if necessary.  Thank you so much for allowing me to participate in the care of Denise Rivas. I will continue to follow up the patient with you and assist in her care. The total time spent in the appointment was 90 minutes including review of chart and various tests results, discussions about plan of care and coordination of care plan .   Disclaimer: This note was dictated with voice recognition software. Similar sounding words can inadvertently be transcribed and may not be corrected upon review.   Denise Rivas March 20, 2024, 12:18 PM

## 2024-03-21 ENCOUNTER — Other Ambulatory Visit: Payer: Self-pay

## 2024-03-21 ENCOUNTER — Other Ambulatory Visit: Payer: Self-pay | Admitting: Physician Assistant

## 2024-03-21 ENCOUNTER — Ambulatory Visit
Admission: RE | Admit: 2024-03-21 | Discharge: 2024-03-21 | Disposition: A | Discharge status: Home / Self Care | Source: Ambulatory Visit | Attending: Radiation Oncology

## 2024-03-21 ENCOUNTER — Ambulatory Visit
Admission: RE | Admit: 2024-03-21 | Discharge: 2024-03-21 | Discharge status: Home / Self Care | Source: Ambulatory Visit | Attending: Radiation Oncology

## 2024-03-21 ENCOUNTER — Inpatient Hospital Stay

## 2024-03-21 ENCOUNTER — Other Ambulatory Visit: Payer: Self-pay | Admitting: Internal Medicine

## 2024-03-21 ENCOUNTER — Encounter: Payer: Self-pay | Admitting: Hematology

## 2024-03-21 DIAGNOSIS — C3412 Malignant neoplasm of upper lobe, left bronchus or lung: Secondary | ICD-10-CM | POA: Diagnosis not present

## 2024-03-21 LAB — RAD ONC ARIA SESSION SUMMARY
Course Elapsed Days: 31
Plan Fractions Treated to Date: 23
Plan Prescribed Dose Per Fraction: 2 Gy
Plan Total Fractions Prescribed: 30
Plan Total Prescribed Dose: 60 Gy
Reference Point Dosage Given to Date: 46 Gy
Reference Point Session Dosage Given: 2 Gy
Session Number: 23

## 2024-03-21 NOTE — Progress Notes (Signed)
 ON PATHWAY REGIMEN - Non-Small Cell Lung  No Change  Continue With Treatment as Ordered.  Original Decision Date/Time: 02/19/2024 15:56     A cycle is every 28 days:     Cisplatin       Etoposide    **Always confirm dose/schedule in your pharmacy ordering system**  Patient Characteristics: Preoperative or Nonsurgical Candidate (Clinical Staging), Stage IIB (N2a only) or Stage III - Nonsurgical Candidate, PS = 0,1 Therapeutic Status: Preoperative or Nonsurgical Candidate (Clinical Staging) AJCC T Category: cT4 AJCC N Category: cN0 AJCC M Category: cM0 AJCC 9 Stage Grouping: IIIA Check here if patient was staged using an edition other than AJCC Staging 9th Edition: true ECOG Performance Status: 1 Intent of Therapy: Curative Intent, Discussed with Patient

## 2024-03-22 ENCOUNTER — Inpatient Hospital Stay

## 2024-03-22 ENCOUNTER — Telehealth: Payer: Self-pay | Admitting: Internal Medicine

## 2024-03-22 ENCOUNTER — Ambulatory Visit
Admission: RE | Admit: 2024-03-22 | Discharge: 2024-03-22 | Disposition: A | Discharge status: Home / Self Care | Source: Ambulatory Visit | Attending: Radiation Oncology | Admitting: Radiation Oncology

## 2024-03-22 ENCOUNTER — Ambulatory Visit

## 2024-03-22 ENCOUNTER — Encounter: Payer: Self-pay | Admitting: Internal Medicine

## 2024-03-22 ENCOUNTER — Other Ambulatory Visit: Payer: Self-pay | Admitting: Pharmacist

## 2024-03-22 ENCOUNTER — Other Ambulatory Visit: Payer: Self-pay

## 2024-03-22 DIAGNOSIS — C3412 Malignant neoplasm of upper lobe, left bronchus or lung: Secondary | ICD-10-CM | POA: Diagnosis not present

## 2024-03-22 LAB — RAD ONC ARIA SESSION SUMMARY
Course Elapsed Days: 32
Plan Fractions Treated to Date: 24
Plan Prescribed Dose Per Fraction: 2 Gy
Plan Total Fractions Prescribed: 30
Plan Total Prescribed Dose: 60 Gy
Reference Point Dosage Given to Date: 48 Gy
Reference Point Session Dosage Given: 2 Gy
Session Number: 24

## 2024-03-22 NOTE — Telephone Encounter (Signed)
 Scheduled and confirmed all appointments at both [facility] and [facility] with the patients niece.

## 2024-03-23 ENCOUNTER — Other Ambulatory Visit: Payer: Self-pay

## 2024-03-25 ENCOUNTER — Inpatient Hospital Stay

## 2024-03-25 ENCOUNTER — Ambulatory Visit
Admission: RE | Admit: 2024-03-25 | Discharge: 2024-03-25 | Disposition: A | Discharge status: Home / Self Care | Source: Ambulatory Visit | Attending: Radiation Oncology | Admitting: Radiation Oncology

## 2024-03-25 ENCOUNTER — Other Ambulatory Visit: Payer: Self-pay

## 2024-03-25 VITALS — BP 113/78 | HR 99 | Temp 98.5°F | Resp 16

## 2024-03-25 DIAGNOSIS — C3412 Malignant neoplasm of upper lobe, left bronchus or lung: Secondary | ICD-10-CM

## 2024-03-25 LAB — CBC WITH DIFFERENTIAL (CANCER CENTER ONLY)
Abs Immature Granulocytes: 0.11 K/uL — ABNORMAL HIGH (ref 0.00–0.07)
Basophils Absolute: 0 K/uL (ref 0.0–0.1)
Basophils Relative: 0 %
Eosinophils Absolute: 0 K/uL (ref 0.0–0.5)
Eosinophils Relative: 0 %
HCT: 30.5 % — ABNORMAL LOW (ref 36.0–46.0)
Hemoglobin: 10.2 g/dL — ABNORMAL LOW (ref 12.0–15.0)
Immature Granulocytes: 1 %
Lymphocytes Relative: 7 %
Lymphs Abs: 0.7 K/uL (ref 0.7–4.0)
MCH: 32.9 pg (ref 26.0–34.0)
MCHC: 33.4 g/dL (ref 30.0–36.0)
MCV: 98.4 fL (ref 80.0–100.0)
Monocytes Absolute: 1.6 K/uL — ABNORMAL HIGH (ref 0.1–1.0)
Monocytes Relative: 16 %
Neutro Abs: 7.5 K/uL (ref 1.7–7.7)
Neutrophils Relative %: 76 %
Platelet Count: 394 K/uL (ref 150–400)
RBC: 3.1 MIL/uL — ABNORMAL LOW (ref 3.87–5.11)
RDW: 15.6 % — ABNORMAL HIGH (ref 11.5–15.5)
WBC Count: 9.9 K/uL (ref 4.0–10.5)
nRBC: 0 % (ref 0.0–0.2)

## 2024-03-25 LAB — RAD ONC ARIA SESSION SUMMARY
Course Elapsed Days: 35
Plan Fractions Treated to Date: 25
Plan Prescribed Dose Per Fraction: 2 Gy
Plan Total Fractions Prescribed: 30
Plan Total Prescribed Dose: 60 Gy
Reference Point Dosage Given to Date: 50 Gy
Reference Point Session Dosage Given: 2 Gy
Session Number: 25

## 2024-03-25 LAB — CMP (CANCER CENTER ONLY)
ALT: 11 U/L (ref 0–44)
AST: 10 U/L — ABNORMAL LOW (ref 15–41)
Albumin: 3.4 g/dL — ABNORMAL LOW (ref 3.5–5.0)
Alkaline Phosphatase: 56 U/L (ref 38–126)
Anion gap: 7 (ref 5–15)
BUN: 15 mg/dL (ref 8–23)
CO2: 25 mmol/L (ref 22–32)
Calcium: 9.5 mg/dL (ref 8.9–10.3)
Chloride: 108 mmol/L (ref 98–111)
Creatinine: 0.86 mg/dL (ref 0.44–1.00)
GFR, Estimated: >60 mL/min (ref >60)
Glucose, Bld: 77 mg/dL (ref 70–99)
Potassium: 3.8 mmol/L (ref 3.5–5.1)
Sodium: 140 mmol/L (ref 135–145)
Total Bilirubin: 0.3 mg/dL (ref 0.0–1.2)
Total Protein: 7.2 g/dL (ref 6.5–8.1)

## 2024-03-25 LAB — MAGNESIUM: Magnesium: 1.7 mg/dL (ref 1.7–2.4)

## 2024-03-25 MED ORDER — MAGNESIUM SULFATE 2 GM/50ML IV SOLN
2.0000 g | Freq: Once | INTRAVENOUS | Status: AC
  Administered 2024-03-25 (×2): 2 g via INTRAVENOUS
  Filled 2024-03-25: qty 50

## 2024-03-25 MED ORDER — SODIUM CHLORIDE 0.9% FLUSH
10.0000 mL | INTRAVENOUS | Status: DC | PRN
Start: 2024-03-25 — End: 2024-03-25

## 2024-03-25 MED ORDER — SODIUM CHLORIDE 0.9 % IV SOLN: INTRAVENOUS | Status: DC

## 2024-03-25 MED ORDER — POTASSIUM CHLORIDE IN NACL 20-0.9 MEQ/L-% IV SOLN
Freq: Once | INTRAVENOUS | Status: AC
  Filled 2024-03-25: qty 1000

## 2024-03-25 MED ORDER — PALONOSETRON HCL INJECTION 0.25 MG/5ML
0.2500 mg | Freq: Once | INTRAVENOUS | Status: AC
  Administered 2024-03-25 (×2): 0.25 mg via INTRAVENOUS
  Filled 2024-03-25: qty 5

## 2024-03-25 MED ORDER — SODIUM CHLORIDE 0.9 % IV SOLN
50.0000 mg/m2 | Freq: Once | INTRAVENOUS | Status: AC
  Administered 2024-03-25 (×2): 78 mg via INTRAVENOUS
  Filled 2024-03-25: qty 3.9

## 2024-03-25 MED ORDER — SODIUM CHLORIDE 0.9% FLUSH
10.0000 mL | Freq: Once | INTRAVENOUS | Status: AC | PRN
  Administered 2024-03-25 (×2): 10 mL

## 2024-03-25 MED ORDER — SODIUM CHLORIDE 0.9 % IV SOLN
150.0000 mg | Freq: Once | INTRAVENOUS | Status: AC
  Administered 2024-03-25 (×2): 150 mg via INTRAVENOUS
  Filled 2024-03-25: qty 150

## 2024-03-25 MED ORDER — SODIUM CHLORIDE 0.9 % IV SOLN
50.0000 mg/m2 | Freq: Once | INTRAVENOUS | Status: AC
  Administered 2024-03-25 (×2): 78 mg via INTRAVENOUS
  Filled 2024-03-25: qty 78

## 2024-03-25 MED ORDER — DEXAMETHASONE SODIUM PHOSPHATE 10 MG/ML IJ SOLN
10.0000 mg | Freq: Once | INTRAMUSCULAR | Status: AC
  Administered 2024-03-25 (×2): 10 mg via INTRAVENOUS
  Filled 2024-03-25: qty 1

## 2024-03-25 NOTE — Patient Instructions (Addendum)
 [facility] - A DEPT OF [facility]  Discharge Instructions: Thank you for choosing [facility] to provide your oncology and hematology care.   If you have a lab appointment with the Cancer Center, please go directly to the Cancer Center and check in at the registration area.   Wear comfortable clothing and clothing appropriate for easy access to any Portacath or PICC line.   We strive to give you quality time with your provider. You may need to reschedule your appointment if you arrive late (15 or more minutes).  Arriving late affects you and other patients whose appointments are after yours.  Also, if you miss three or more appointments without notifying the office, you may be dismissed from the clinic at the provider's discretion.      For prescription refill requests, have your pharmacy contact our office and allow 72 hours for refills to be completed.    Today you received the following chemotherapy and/or immunotherapy agents: Cisplatin  (Platinol ) & Etoposide  (Vepesid )    To help prevent nausea and vomiting after your treatment, we encourage you to take your nausea medication as directed.  BELOW ARE SYMPTOMS THAT SHOULD BE REPORTED IMMEDIATELY: *FEVER GREATER THAN 100.4 F (38 C) OR HIGHER *CHILLS OR SWEATING *NAUSEA AND VOMITING THAT IS NOT CONTROLLED WITH YOUR NAUSEA MEDICATION *UNUSUAL SHORTNESS OF BREATH *UNUSUAL BRUISING OR BLEEDING *URINARY PROBLEMS (pain or burning when urinating, or frequent urination) *BOWEL PROBLEMS (unusual diarrhea, constipation, pain near the anus) TENDERNESS IN MOUTH AND THROAT WITH OR WITHOUT PRESENCE OF ULCERS (sore throat, sores in mouth, or a toothache) UNUSUAL RASH, SWELLING OR PAIN  UNUSUAL VAGINAL DISCHARGE OR ITCHING   Items with * indicate a potential emergency and should be followed up as soon as possible or go to the Emergency Department if any problems should occur.  Please show the  CHEMOTHERAPY ALERT CARD or IMMUNOTHERAPY ALERT CARD at check-in to the Emergency Department and triage nurse.  Should you have questions after your visit or need to cancel or reschedule your appointment, please contact [facility]  Dept: [phone]  and follow the prompts.  Office hours are 8:00 a.m. to 4:30 p.m. Monday - Friday. Please note that voicemails left after 4:00 p.m. may not be returned until the following business day.  We are closed weekends and major holidays. You have access to a nurse at all times for urgent questions. Please call the main number to the clinic Dept: [phone] and follow the prompts.   For any non-urgent questions, you may also contact your provider using MyChart. We now offer e-Visits for anyone 68 and older to request care online for non-urgent symptoms. For details visit [email or web address].   Also download the MyChart app! Go to the app store, search MyChart, open the app, select [facility], and log in with your MyChart username and password.

## 2024-03-26 ENCOUNTER — Ambulatory Visit
Admission: RE | Admit: 2024-03-26 | Discharge: 2024-03-26 | Disposition: A | Discharge status: Home / Self Care | Source: Ambulatory Visit | Attending: Radiation Oncology

## 2024-03-26 ENCOUNTER — Inpatient Hospital Stay

## 2024-03-26 ENCOUNTER — Encounter: Payer: Self-pay | Admitting: Internal Medicine

## 2024-03-26 ENCOUNTER — Other Ambulatory Visit: Payer: Self-pay

## 2024-03-26 VITALS — BP 127/72 | HR 100 | Temp 98.1°F | Resp 18 | Wt 128.4 lb

## 2024-03-26 DIAGNOSIS — C3412 Malignant neoplasm of upper lobe, left bronchus or lung: Secondary | ICD-10-CM

## 2024-03-26 LAB — RAD ONC ARIA SESSION SUMMARY
Course Elapsed Days: 36
Plan Fractions Treated to Date: 26
Plan Prescribed Dose Per Fraction: 2 Gy
Plan Total Fractions Prescribed: 30
Plan Total Prescribed Dose: 60 Gy
Reference Point Dosage Given to Date: 52 Gy
Reference Point Session Dosage Given: 2 Gy
Session Number: 26

## 2024-03-26 MED ORDER — DEXAMETHASONE SODIUM PHOSPHATE 10 MG/ML IJ SOLN
10.0000 mg | Freq: Once | INTRAMUSCULAR | Status: AC
  Administered 2024-03-26 (×2): 10 mg via INTRAVENOUS
  Filled 2024-03-26: qty 1

## 2024-03-26 MED ORDER — SODIUM CHLORIDE 0.9 % IV SOLN
50.0000 mg/m2 | Freq: Once | INTRAVENOUS | Status: AC
  Administered 2024-03-26 (×2): 78 mg via INTRAVENOUS
  Filled 2024-03-26: qty 3.9

## 2024-03-26 MED ORDER — SODIUM CHLORIDE 0.9 % IV SOLN
INTRAVENOUS | Status: DC
Start: 2024-03-26 — End: 2024-03-26

## 2024-03-26 NOTE — Patient Instructions (Signed)
 [facility] [place] - A DEPT OF [place]  Discharge Instructions: Thank you for choosing [facility] to provide your oncology and hematology care.  If you have a lab appointment with the Cancer Center - please note that after April 8th, 2024, all labs will be drawn in the cancer center.  You do not have to check in or register with the main entrance as you have in the past but will complete your check-in in the cancer center.  Wear comfortable clothing and clothing appropriate for easy access to any Portacath or PICC line.   We strive to give you quality time with your provider. You may need to reschedule your appointment if you arrive late (15 or more minutes).  Arriving late affects you and other patients whose appointments are after yours.  Also, if you miss three or more appointments without notifying the office, you may be dismissed from the clinic at the provider's discretion.      For prescription refill requests, have your pharmacy contact our office and allow 72 hours for refills to be completed.    Today you received the following chemotherapy and/or immunotherapy agents Etoposide , return as scheduled.   To help prevent nausea and vomiting after your treatment, we encourage you to take your nausea medication as directed.  BELOW ARE SYMPTOMS THAT SHOULD BE REPORTED IMMEDIATELY: *FEVER GREATER THAN 100.4 F (38 C) OR HIGHER *CHILLS OR SWEATING *NAUSEA AND VOMITING THAT IS NOT CONTROLLED WITH YOUR NAUSEA MEDICATION *UNUSUAL SHORTNESS OF BREATH *UNUSUAL BRUISING OR BLEEDING *URINARY PROBLEMS (pain or burning when urinating, or frequent urination) *BOWEL PROBLEMS (unusual diarrhea, constipation, pain near the anus) TENDERNESS IN MOUTH AND THROAT WITH OR WITHOUT PRESENCE OF ULCERS (sore throat, sores in mouth, or a toothache) UNUSUAL RASH, SWELLING OR PAIN  UNUSUAL VAGINAL DISCHARGE OR ITCHING   Items with * indicate a potential emergency and  should be followed up as soon as possible or go to the Emergency Department if any problems should occur.  Please show the CHEMOTHERAPY ALERT CARD or IMMUNOTHERAPY ALERT CARD at check-in to the Emergency Department and triage nurse.  Should you have questions after your visit or need to cancel or reschedule your appointment, please contact [facility] [place] - [facility] DEPT [facility] [phone]  and follow the prompts.  Office hours are 8:00 a.m. to 4:30 p.m. Monday - Friday. Please note that voicemails left after 4:00 p.m. may not be returned until the following business day.  We are closed weekends and major holidays. You have access to a nurse at all times for urgent questions. Please call the main number to the clinic [phone] and follow the prompts.  For any non-urgent questions, you may also contact your provider using MyChart. We now offer e-Visits for anyone 59 and older to request care online for non-urgent symptoms. For details visit [email or web address].   Also download the MyChart app! Go to the app store, search MyChart, open the app, select [place], and log in with your MyChart username and password.

## 2024-03-26 NOTE — Progress Notes (Signed)
 Patient presents today for day 2 of treatment, vitals and labs within treatment parameters. Patient tolerated chemotherapy with no complaints voiced. Side effects with management reviewed understanding verbalized. Port site clean and dry with no bruising or swelling noted at site. Good blood return noted before and after administration of chemotherapy. Band aid applied. Patient left in satisfactory condition with VSS and no s/s of distress noted.

## 2024-03-27 ENCOUNTER — Encounter: Payer: Self-pay | Admitting: Internal Medicine

## 2024-03-27 ENCOUNTER — Other Ambulatory Visit: Payer: Self-pay

## 2024-03-27 ENCOUNTER — Inpatient Hospital Stay

## 2024-03-27 ENCOUNTER — Ambulatory Visit
Admission: RE | Admit: 2024-03-27 | Discharge: 2024-03-27 | Disposition: A | Discharge status: Home / Self Care | Source: Ambulatory Visit | Attending: Radiation Oncology | Admitting: Radiation Oncology

## 2024-03-27 VITALS — BP 126/78 | HR 93 | Temp 97.6°F | Resp 18

## 2024-03-27 DIAGNOSIS — C3412 Malignant neoplasm of upper lobe, left bronchus or lung: Secondary | ICD-10-CM | POA: Diagnosis not present

## 2024-03-27 LAB — RAD ONC ARIA SESSION SUMMARY
Course Elapsed Days: 37
Plan Fractions Treated to Date: 27
Plan Prescribed Dose Per Fraction: 2 Gy
Plan Total Fractions Prescribed: 30
Plan Total Prescribed Dose: 60 Gy
Reference Point Dosage Given to Date: 54 Gy
Reference Point Session Dosage Given: 2 Gy
Session Number: 27

## 2024-03-27 MED ORDER — DEXAMETHASONE SODIUM PHOSPHATE 10 MG/ML IJ SOLN
10.0000 mg | Freq: Once | INTRAMUSCULAR | Status: AC
  Administered 2024-03-27 (×2): 10 mg via INTRAVENOUS
  Filled 2024-03-27: qty 1

## 2024-03-27 MED ORDER — SODIUM CHLORIDE 0.9 % IV SOLN
50.0000 mg/m2 | Freq: Once | INTRAVENOUS | Status: AC
  Administered 2024-03-27 (×2): 78 mg via INTRAVENOUS
  Filled 2024-03-27: qty 3.9

## 2024-03-27 MED ORDER — SODIUM CHLORIDE 0.9 % IV SOLN: INTRAVENOUS | Status: DC

## 2024-03-27 NOTE — Progress Notes (Signed)
Patient tolerated chemotherapy with no complaints voiced. Side effects with management reviewed understanding verbalized. Port site clean and dry with no bruising or swelling noted at site. Good blood return noted before and after administration of chemotherapy. Band aid applied. Patient left in satisfactory condition with VSS and no s/s of distress noted. 

## 2024-03-27 NOTE — Patient Instructions (Signed)
 [facility] [place] - A DEPT OF [place]  Discharge Instructions: Thank you for choosing [facility] to provide your oncology and hematology care.  If you have a lab appointment with the Cancer Center - please note that after April 8th, 2024, all labs will be drawn in the cancer center.  You do not have to check in or register with the main entrance as you have in the past but will complete your check-in in the cancer center.  Wear comfortable clothing and clothing appropriate for easy access to any Portacath or PICC line.   We strive to give you quality time with your provider. You may need to reschedule your appointment if you arrive late (15 or more minutes).  Arriving late affects you and other patients whose appointments are after yours.  Also, if you miss three or more appointments without notifying the office, you may be dismissed from the clinic at the provider's discretion.      For prescription refill requests, have your pharmacy contact our office and allow 72 hours for refills to be completed.    Today you received the following chemotherapy and/or immunotherapy agents Etoposide , return as scheduled.   To help prevent nausea and vomiting after your treatment, we encourage you to take your nausea medication as directed.  BELOW ARE SYMPTOMS THAT SHOULD BE REPORTED IMMEDIATELY: *FEVER GREATER THAN 100.4 F (38 C) OR HIGHER *CHILLS OR SWEATING *NAUSEA AND VOMITING THAT IS NOT CONTROLLED WITH YOUR NAUSEA MEDICATION *UNUSUAL SHORTNESS OF BREATH *UNUSUAL BRUISING OR BLEEDING *URINARY PROBLEMS (pain or burning when urinating, or frequent urination) *BOWEL PROBLEMS (unusual diarrhea, constipation, pain near the anus) TENDERNESS IN MOUTH AND THROAT WITH OR WITHOUT PRESENCE OF ULCERS (sore throat, sores in mouth, or a toothache) UNUSUAL RASH, SWELLING OR PAIN  UNUSUAL VAGINAL DISCHARGE OR ITCHING   Items with * indicate a potential emergency and  should be followed up as soon as possible or go to the Emergency Department if any problems should occur.  Please show the CHEMOTHERAPY ALERT CARD or IMMUNOTHERAPY ALERT CARD at check-in to the Emergency Department and triage nurse.  Should you have questions after your visit or need to cancel or reschedule your appointment, please contact [facility] [place] - [facility] DEPT [facility] [phone]  and follow the prompts.  Office hours are 8:00 a.m. to 4:30 p.m. Monday - Friday. Please note that voicemails left after 4:00 p.m. may not be returned until the following business day.  We are closed weekends and major holidays. You have access to a nurse at all times for urgent questions. Please call the main number to the clinic [phone] and follow the prompts.  For any non-urgent questions, you may also contact your provider using MyChart. We now offer e-Visits for anyone 59 and older to request care online for non-urgent symptoms. For details visit [email or web address].   Also download the MyChart app! Go to the app store, search MyChart, open the app, select [place], and log in with your MyChart username and password.

## 2024-03-28 ENCOUNTER — Ambulatory Visit
Admission: RE | Admit: 2024-03-28 | Discharge: 2024-03-28 | Disposition: A | Discharge status: Home / Self Care | Source: Ambulatory Visit | Attending: Radiation Oncology | Admitting: Radiation Oncology

## 2024-03-28 ENCOUNTER — Inpatient Hospital Stay

## 2024-03-28 ENCOUNTER — Other Ambulatory Visit: Payer: Self-pay

## 2024-03-28 ENCOUNTER — Encounter: Payer: Self-pay | Admitting: Internal Medicine

## 2024-03-28 VITALS — BP 123/79 | HR 92 | Temp 98.3°F | Resp 18

## 2024-03-28 DIAGNOSIS — C3412 Malignant neoplasm of upper lobe, left bronchus or lung: Secondary | ICD-10-CM

## 2024-03-28 LAB — RAD ONC ARIA SESSION SUMMARY
Course Elapsed Days: 38
Plan Fractions Treated to Date: 28
Plan Prescribed Dose Per Fraction: 2 Gy
Plan Total Fractions Prescribed: 30
Plan Total Prescribed Dose: 60 Gy
Reference Point Dosage Given to Date: 56 Gy
Reference Point Session Dosage Given: 2 Gy
Session Number: 28

## 2024-03-28 MED ORDER — SODIUM CHLORIDE 0.9 % IV SOLN
50.0000 mg/m2 | Freq: Once | INTRAVENOUS | Status: AC
  Administered 2024-03-28: 78 mg via INTRAVENOUS
  Filled 2024-03-28: qty 3.9

## 2024-03-28 MED ORDER — DEXAMETHASONE SODIUM PHOSPHATE 10 MG/ML IJ SOLN
10.0000 mg | Freq: Once | INTRAMUSCULAR | Status: AC
  Administered 2024-03-28: 10 mg via INTRAVENOUS
  Filled 2024-03-28: qty 1

## 2024-03-28 MED ORDER — SODIUM CHLORIDE 0.9 % IV SOLN: INTRAVENOUS | Status: DC

## 2024-03-28 NOTE — Progress Notes (Signed)
Patient tolerated chemotherapy with no complaints voiced. Side effects with management reviewed understanding verbalized. Port site clean and dry with no bruising or swelling noted at site. Good blood return noted before and after administration of chemotherapy. Band aid applied. Patient left in satisfactory condition with VSS and no s/s of distress noted. 

## 2024-03-28 NOTE — Patient Instructions (Signed)
 [facility] [place] - A DEPT OF [place]  Discharge Instructions: Thank you for choosing [facility] to provide your oncology and hematology care.  If you have a lab appointment with the Cancer Center - please note that after April 8th, 2024, all labs will be drawn in the cancer center.  You do not have to check in or register with the main entrance as you have in the past but will complete your check-in in the cancer center.  Wear comfortable clothing and clothing appropriate for easy access to any Portacath or PICC line.   We strive to give you quality time with your provider. You may need to reschedule your appointment if you arrive late (15 or more minutes).  Arriving late affects you and other patients whose appointments are after yours.  Also, if you miss three or more appointments without notifying the office, you may be dismissed from the clinic at the provider's discretion.      For prescription refill requests, have your pharmacy contact our office and allow 72 hours for refills to be completed.    Today you received the following chemotherapy and/or immunotherapy agents Etoposide , return as scheduled.   To help prevent nausea and vomiting after your treatment, we encourage you to take your nausea medication as directed.  BELOW ARE SYMPTOMS THAT SHOULD BE REPORTED IMMEDIATELY: *FEVER GREATER THAN 100.4 F (38 C) OR HIGHER *CHILLS OR SWEATING *NAUSEA AND VOMITING THAT IS NOT CONTROLLED WITH YOUR NAUSEA MEDICATION *UNUSUAL SHORTNESS OF BREATH *UNUSUAL BRUISING OR BLEEDING *URINARY PROBLEMS (pain or burning when urinating, or frequent urination) *BOWEL PROBLEMS (unusual diarrhea, constipation, pain near the anus) TENDERNESS IN MOUTH AND THROAT WITH OR WITHOUT PRESENCE OF ULCERS (sore throat, sores in mouth, or a toothache) UNUSUAL RASH, SWELLING OR PAIN  UNUSUAL VAGINAL DISCHARGE OR ITCHING   Items with * indicate a potential emergency and  should be followed up as soon as possible or go to the Emergency Department if any problems should occur.  Please show the CHEMOTHERAPY ALERT CARD or IMMUNOTHERAPY ALERT CARD at check-in to the Emergency Department and triage nurse.  Should you have questions after your visit or need to cancel or reschedule your appointment, please contact [facility] [place] - [facility] DEPT [facility] [phone]  and follow the prompts.  Office hours are 8:00 a.m. to 4:30 p.m. Monday - Friday. Please note that voicemails left after 4:00 p.m. may not be returned until the following business day.  We are closed weekends and major holidays. You have access to a nurse at all times for urgent questions. Please call the main number to the clinic [phone] and follow the prompts.  For any non-urgent questions, you may also contact your provider using MyChart. We now offer e-Visits for anyone 59 and older to request care online for non-urgent symptoms. For details visit [email or web address].   Also download the MyChart app! Go to the app store, search MyChart, open the app, select [place], and log in with your MyChart username and password.

## 2024-03-29 ENCOUNTER — Ambulatory Visit
Admission: RE | Admit: 2024-03-29 | Discharge: 2024-03-29 | Disposition: A | Discharge status: Home / Self Care | Source: Ambulatory Visit | Attending: Radiation Oncology | Admitting: Radiation Oncology

## 2024-03-29 ENCOUNTER — Inpatient Hospital Stay

## 2024-03-29 ENCOUNTER — Encounter: Payer: Self-pay | Admitting: Internal Medicine

## 2024-03-29 ENCOUNTER — Other Ambulatory Visit: Payer: Self-pay

## 2024-03-29 VITALS — BP 136/87 | HR 89 | Temp 98.7°F | Resp 18

## 2024-03-29 DIAGNOSIS — C3412 Malignant neoplasm of upper lobe, left bronchus or lung: Secondary | ICD-10-CM

## 2024-03-29 LAB — RAD ONC ARIA SESSION SUMMARY
Course Elapsed Days: 39
Plan Fractions Treated to Date: 29
Plan Prescribed Dose Per Fraction: 2 Gy
Plan Total Fractions Prescribed: 30
Plan Total Prescribed Dose: 60 Gy
Reference Point Dosage Given to Date: 58 Gy
Reference Point Session Dosage Given: 2 Gy
Session Number: 29

## 2024-03-29 MED ORDER — SODIUM CHLORIDE 0.9 % IV SOLN
INTRAVENOUS | Status: DC
Start: 2024-03-29 — End: 2024-03-29

## 2024-03-29 MED ORDER — DEXAMETHASONE SODIUM PHOSPHATE 10 MG/ML IJ SOLN
10.0000 mg | Freq: Once | INTRAMUSCULAR | Status: AC
  Administered 2024-03-29: 10 mg via INTRAVENOUS
  Filled 2024-03-29: qty 1

## 2024-03-29 MED ORDER — SODIUM CHLORIDE 0.9 % IV SOLN
50.0000 mg/m2 | Freq: Once | INTRAVENOUS | Status: AC
  Administered 2024-03-29: 78 mg via INTRAVENOUS
  Filled 2024-03-29: qty 3.9

## 2024-03-29 MED FILL — Fosaprepitant Dimeglumine For IV Infusion 150 MG (Base Eq): INTRAVENOUS | Qty: 5 | Status: AC

## 2024-03-29 NOTE — Progress Notes (Signed)
Patient tolerated chemotherapy with no complaints voiced. Side effects with management reviewed understanding verbalized. Port site clean and dry with no bruising or swelling noted at site. Good blood return noted before and after administration of chemotherapy. Band aid applied. Patient left in satisfactory condition with VSS and no s/s of distress noted. 

## 2024-03-29 NOTE — Patient Instructions (Signed)
 [facility] [place] - A DEPT OF [facility]  Discharge Instructions: Thank you for choosing [facility] to provide your oncology and hematology care.  If you have a lab appointment with the Cancer Center - please note that after April 8th, 2024, all labs will be drawn in the cancer center.  You do not have to check in or register with the main entrance as you have in the past but will complete your check-in in the cancer center.  Wear comfortable clothing and clothing appropriate for easy access to any Portacath or PICC line.   We strive to give you quality time with your provider. You may need to reschedule your appointment if you arrive late (15 or more minutes).  Arriving late affects you and other patients whose appointments are after yours.  Also, if you miss three or more appointments without notifying the office, you may be dismissed from the clinic at the provider's discretion.      For prescription refill requests, have your pharmacy contact our office and allow 72 hours for refills to be completed.    Today you received the following Etoposide , return as scheduled.   To help prevent nausea and vomiting after your treatment, we encourage you to take your nausea medication as directed.  BELOW ARE SYMPTOMS THAT SHOULD BE REPORTED IMMEDIATELY: *FEVER GREATER THAN 100.4 F (38 C) OR HIGHER *CHILLS OR SWEATING *NAUSEA AND VOMITING THAT IS NOT CONTROLLED WITH YOUR NAUSEA MEDICATION *UNUSUAL SHORTNESS OF BREATH *UNUSUAL BRUISING OR BLEEDING *URINARY PROBLEMS (pain or burning when urinating, or frequent urination) *BOWEL PROBLEMS (unusual diarrhea, constipation, pain near the anus) TENDERNESS IN MOUTH AND THROAT WITH OR WITHOUT PRESENCE OF ULCERS (sore throat, sores in mouth, or a toothache) UNUSUAL RASH, SWELLING OR PAIN  UNUSUAL VAGINAL DISCHARGE OR ITCHING   Items with * indicate a potential emergency and should be followed up as soon as possible  or go to the Emergency Department if any problems should occur.  Please show the CHEMOTHERAPY ALERT CARD or IMMUNOTHERAPY ALERT CARD at check-in to the Emergency Department and triage nurse.  Should you have questions after your visit or need to cancel or reschedule your appointment, please contact [facility] [place] - A DEPT OF [facility] [phone]  and follow the prompts.  Office hours are 8:00 a.m. to 4:30 p.m. Monday - Friday. Please note that voicemails left after 4:00 p.m. may not be returned until the following business day.  We are closed weekends and major holidays. You have access to a nurse at all times for urgent questions. Please call the main number to the clinic [phone] and follow the prompts.  For any non-urgent questions, you may also contact your provider using MyChart. We now offer e-Visits for anyone 1 and older to request care online for non-urgent symptoms. For details visit [email or web address].   Also download the MyChart app! Go to the app store, search MyChart, open the app, select , and log in with your MyChart username and password.

## 2024-04-01 ENCOUNTER — Ambulatory Visit
Admission: RE | Admit: 2024-04-01 | Discharge: 2024-04-01 | Disposition: A | Discharge status: Home / Self Care | Source: Ambulatory Visit | Attending: Radiation Oncology | Admitting: Radiation Oncology

## 2024-04-01 ENCOUNTER — Other Ambulatory Visit: Payer: Self-pay

## 2024-04-01 ENCOUNTER — Inpatient Hospital Stay

## 2024-04-01 VITALS — BP 133/96 | HR 82 | Temp 99.4°F | Resp 18 | Wt 128.0 lb

## 2024-04-01 DIAGNOSIS — C3412 Malignant neoplasm of upper lobe, left bronchus or lung: Secondary | ICD-10-CM | POA: Diagnosis not present

## 2024-04-01 LAB — CBC WITH DIFFERENTIAL/PLATELET
Abs Immature Granulocytes: 0.09 K/uL — ABNORMAL HIGH (ref 0.00–0.07)
Basophils Absolute: 0 K/uL (ref 0.0–0.1)
Basophils Relative: 0 %
Eosinophils Absolute: 0.1 K/uL (ref 0.0–0.5)
Eosinophils Relative: 1 %
HCT: 27.2 % — ABNORMAL LOW (ref 36.0–46.0)
Hemoglobin: 9.3 g/dL — ABNORMAL LOW (ref 12.0–15.0)
Immature Granulocytes: 2 %
Lymphocytes Relative: 6 %
Lymphs Abs: 0.3 K/uL — ABNORMAL LOW (ref 0.7–4.0)
MCH: 32.7 pg (ref 26.0–34.0)
MCHC: 34.2 g/dL (ref 30.0–36.0)
MCV: 95.8 fL (ref 80.0–100.0)
Monocytes Absolute: 0.1 K/uL (ref 0.1–1.0)
Monocytes Relative: 1 %
Neutro Abs: 5 K/uL (ref 1.7–7.7)
Neutrophils Relative %: 90 %
Platelets: 238 K/uL (ref 150–400)
RBC: 2.84 MIL/uL — ABNORMAL LOW (ref 3.87–5.11)
RDW: 15.3 % (ref 11.5–15.5)
WBC: 5.6 K/uL (ref 4.0–10.5)
nRBC: 0 % (ref 0.0–0.2)

## 2024-04-01 LAB — RAD ONC ARIA SESSION SUMMARY
Course Elapsed Days: 42
Plan Fractions Treated to Date: 30
Plan Prescribed Dose Per Fraction: 2 Gy
Plan Total Fractions Prescribed: 30
Plan Total Prescribed Dose: 60 Gy
Reference Point Dosage Given to Date: 60 Gy
Reference Point Session Dosage Given: 2 Gy
Session Number: 30

## 2024-04-01 LAB — COMPREHENSIVE METABOLIC PANEL WITH GFR
ALT: 8 U/L (ref 0–44)
AST: 7 U/L — ABNORMAL LOW (ref 15–41)
Albumin: 3.2 g/dL — ABNORMAL LOW (ref 3.5–5.0)
Alkaline Phosphatase: 48 U/L (ref 38–126)
Anion gap: 6 (ref 5–15)
BUN: 16 mg/dL (ref 8–23)
CO2: 26 mmol/L (ref 22–32)
Calcium: 8.6 mg/dL — ABNORMAL LOW (ref 8.9–10.3)
Chloride: 107 mmol/L (ref 98–111)
Creatinine, Ser: 0.51 mg/dL (ref 0.44–1.00)
GFR, Estimated: >60 mL/min (ref >60)
Glucose, Bld: 65 mg/dL — ABNORMAL LOW (ref 70–99)
Potassium: 3.5 mmol/L (ref 3.5–5.1)
Sodium: 139 mmol/L (ref 135–145)
Total Bilirubin: 0.5 mg/dL (ref 0.0–1.2)
Total Protein: 6 g/dL — ABNORMAL LOW (ref 6.5–8.1)

## 2024-04-01 LAB — MAGNESIUM: Magnesium: 1.6 mg/dL — ABNORMAL LOW (ref 1.7–2.4)

## 2024-04-01 MED ORDER — MAGNESIUM SULFATE 2 GM/50ML IV SOLN
2.0000 g | Freq: Once | INTRAVENOUS | Status: AC
Start: 2024-04-01 — End: 2024-04-01
  Administered 2024-04-01: 2 g via INTRAVENOUS
  Filled 2024-04-01: qty 50

## 2024-04-01 MED ORDER — PALONOSETRON HCL INJECTION 0.25 MG/5ML
0.2500 mg | Freq: Once | INTRAVENOUS | Status: AC
  Administered 2024-04-01: 0.25 mg via INTRAVENOUS
  Filled 2024-04-01: qty 5

## 2024-04-01 MED ORDER — SODIUM CHLORIDE 0.9% FLUSH: 10.0000 mL | INTRAVENOUS | Status: DC | PRN

## 2024-04-01 MED ORDER — SODIUM CHLORIDE 0.9 % IV SOLN
150.0000 mg | Freq: Once | INTRAVENOUS | Status: AC
  Administered 2024-04-01: 150 mg via INTRAVENOUS
  Filled 2024-04-01: qty 150

## 2024-04-01 MED ORDER — SODIUM CHLORIDE 0.9 % IV SOLN: INTRAVENOUS | Status: DC

## 2024-04-01 MED ORDER — SODIUM CHLORIDE 0.9 % IV SOLN
50.0000 mg/m2 | Freq: Once | INTRAVENOUS | Status: AC
  Administered 2024-04-01: 78 mg via INTRAVENOUS
  Filled 2024-04-01: qty 78

## 2024-04-01 MED ORDER — POTASSIUM CHLORIDE IN NACL 20-0.9 MEQ/L-% IV SOLN
Freq: Once | INTRAVENOUS | Status: AC
  Filled 2024-04-01: qty 1000

## 2024-04-01 MED ORDER — MAGNESIUM SULFATE 2 GM/50ML IV SOLN
2.0000 g | Freq: Once | INTRAVENOUS | Status: AC
  Administered 2024-04-01: 2 g via INTRAVENOUS
  Filled 2024-04-01: qty 50

## 2024-04-01 MED ORDER — HEPARIN SOD (PORK) LOCK FLUSH 100 UNIT/ML IV SOLN: 500.0000 [IU] | Freq: Once | INTRAVENOUS | Status: DC | PRN

## 2024-04-01 MED ORDER — DEXAMETHASONE SODIUM PHOSPHATE 10 MG/ML IJ SOLN
10.0000 mg | Freq: Once | INTRAMUSCULAR | Status: AC
  Administered 2024-04-01: 10 mg via INTRAVENOUS
  Filled 2024-04-01: qty 1

## 2024-04-01 NOTE — Patient Instructions (Signed)

## 2024-04-01 NOTE — Progress Notes (Signed)
 Additional Mg 2g added to tx today d/t Mg=1.6 per Dr. Sherrod.  Evora Schechter, PharmD, MBA

## 2024-04-01 NOTE — Progress Notes (Signed)
 Per Sherrod MD, ok to run Cisplatin  pre-fluids prior to lab results today. Ok to run post-fluids with treatment today.

## 2024-04-02 ENCOUNTER — Other Ambulatory Visit: Payer: Self-pay

## 2024-04-02 ENCOUNTER — Ambulatory Visit
Admission: RE | Admit: 2024-04-02 | Discharge: 2024-04-02 | Disposition: A | Discharge status: Home / Self Care | Source: Ambulatory Visit | Attending: Radiation Oncology | Admitting: Radiation Oncology

## 2024-04-02 DIAGNOSIS — C3412 Malignant neoplasm of upper lobe, left bronchus or lung: Secondary | ICD-10-CM | POA: Diagnosis not present

## 2024-04-02 LAB — RAD ONC ARIA SESSION SUMMARY
Course Elapsed Days: 43
Plan Fractions Treated to Date: 1
Plan Prescribed Dose Per Fraction: 2 Gy
Plan Total Fractions Prescribed: 3
Plan Total Prescribed Dose: 6 Gy
Reference Point Dosage Given to Date: 2 Gy
Reference Point Session Dosage Given: 2 Gy
Session Number: 31

## 2024-04-03 ENCOUNTER — Other Ambulatory Visit: Payer: Self-pay

## 2024-04-03 ENCOUNTER — Ambulatory Visit

## 2024-04-03 ENCOUNTER — Ambulatory Visit
Admission: RE | Admit: 2024-04-03 | Discharge: 2024-04-03 | Disposition: A | Discharge status: Home / Self Care | Source: Ambulatory Visit | Attending: Radiation Oncology

## 2024-04-03 DIAGNOSIS — C3412 Malignant neoplasm of upper lobe, left bronchus or lung: Secondary | ICD-10-CM | POA: Diagnosis not present

## 2024-04-03 LAB — RAD ONC ARIA SESSION SUMMARY
Course Elapsed Days: 44
Plan Fractions Treated to Date: 2
Plan Prescribed Dose Per Fraction: 2 Gy
Plan Total Fractions Prescribed: 3
Plan Total Prescribed Dose: 6 Gy
Reference Point Dosage Given to Date: 4 Gy
Reference Point Session Dosage Given: 2 Gy
Session Number: 32

## 2024-04-04 ENCOUNTER — Ambulatory Visit
Admission: RE | Admit: 2024-04-04 | Discharge: 2024-04-04 | Disposition: A | Discharge status: Home / Self Care | Source: Ambulatory Visit | Attending: Radiation Oncology | Admitting: Radiation Oncology

## 2024-04-04 ENCOUNTER — Other Ambulatory Visit: Payer: Self-pay

## 2024-04-04 DIAGNOSIS — C3412 Malignant neoplasm of upper lobe, left bronchus or lung: Secondary | ICD-10-CM | POA: Diagnosis not present

## 2024-04-04 LAB — RAD ONC ARIA SESSION SUMMARY
Course Elapsed Days: 45
Plan Fractions Treated to Date: 3
Plan Prescribed Dose Per Fraction: 2 Gy
Plan Total Fractions Prescribed: 3
Plan Total Prescribed Dose: 6 Gy
Reference Point Dosage Given to Date: 6 Gy
Reference Point Session Dosage Given: 2 Gy
Session Number: 33

## 2024-04-05 NOTE — Radiation Completion Notes (Addendum)
  Radiation Oncology         [phone] ________________________________  Name: Denise Rivas MRN: [id number]  Date of Service: 04/04/2024  DOB: 1961-03-20  End of Treatment Note    Diagnosis:    Locally Progressive Stage IIB, cT3N0M0, NSCLC, Squamous Cell Carcinoma.   Intent: Curative     ==========DELIVERED PLANS==========  First Treatment Date: 2024-02-19 Last Treatment Date: 2024-04-04   Plan Name: Lung_L Site: Lung, Left Technique: 3D Mode: Photon Dose Per Fraction: 2 Gy Prescribed Dose (Delivered / Prescribed): 60 Gy / 60 Gy Prescribed Fxs (Delivered / Prescribed): 30 / 30   Plan Name: Lung_L_Bst Site: Lung, Left Technique: 3D Mode: Photon Dose Per Fraction: 2 Gy Prescribed Dose (Delivered / Prescribed): 6 Gy / 6 Gy Prescribed Fxs (Delivered / Prescribed): 3 / 3     ==========ON TREATMENT VISIT DATES========== 2024-02-23, 2024-02-29, 2024-03-08, 2024-03-15, 2024-03-21, 2024-03-29, 2024-04-04    See weekly On Treatment Notes in Epic for details in the Media tab (listed as Progress notes on the On Treatment Visit Dates listed above). The patient tolerated radiation. She developed feelings of heartburn/esophagitis toward the conclusion of radiotherapy.   The patient will receive a call in about one month from the radiation oncology department. She will continue follow up with Dr. Sherrod as well.      Donald KYM Husband, PAC

## 2024-04-07 ENCOUNTER — Other Ambulatory Visit: Payer: Self-pay

## 2024-04-16 ENCOUNTER — Other Ambulatory Visit

## 2024-04-16 ENCOUNTER — Other Ambulatory Visit: Payer: Self-pay | Admitting: Radiation Oncology

## 2024-04-16 ENCOUNTER — Ambulatory Visit

## 2024-04-16 ENCOUNTER — Ambulatory Visit: Admitting: Oncology

## 2024-04-16 MED ORDER — SUCRALFATE 1 G PO TABS: 1.0000 g | ORAL_TABLET | Freq: Three times a day (TID) | ORAL | 2 refills | Status: AC

## 2024-04-17 ENCOUNTER — Ambulatory Visit

## 2024-04-17 ENCOUNTER — Ambulatory Visit (HOSPITAL_COMMUNITY)
Admission: RE | Admit: 2024-04-17 | Discharge: 2024-04-17 | Disposition: A | Discharge status: Home / Self Care | Source: Ambulatory Visit | Attending: Internal Medicine | Admitting: Internal Medicine

## 2024-04-17 DIAGNOSIS — C3490 Malignant neoplasm of unspecified part of unspecified bronchus or lung: Secondary | ICD-10-CM | POA: Insufficient documentation

## 2024-04-17 MED ORDER — IOHEXOL 300 MG/ML  SOLN
75.0000 mL | Freq: Once | INTRAMUSCULAR | Status: AC | PRN
  Administered 2024-04-17: 75 mL via INTRAVENOUS

## 2024-04-18 ENCOUNTER — Other Ambulatory Visit: Payer: Self-pay | Admitting: *Deleted

## 2024-04-18 ENCOUNTER — Ambulatory Visit

## 2024-04-19 ENCOUNTER — Encounter: Payer: Self-pay | Admitting: Oncology

## 2024-04-19 ENCOUNTER — Encounter: Payer: Self-pay | Admitting: Internal Medicine

## 2024-04-19 ENCOUNTER — Ambulatory Visit

## 2024-04-19 MED ORDER — PROCHLORPERAZINE MALEATE 10 MG PO TABS: 10.0000 mg | ORAL_TABLET | Freq: Four times a day (QID) | ORAL | 2 refills | Status: DC | PRN

## 2024-04-22 ENCOUNTER — Other Ambulatory Visit: Payer: Self-pay | Admitting: *Deleted

## 2024-04-23 ENCOUNTER — Ambulatory Visit

## 2024-04-23 ENCOUNTER — Other Ambulatory Visit

## 2024-04-24 ENCOUNTER — Other Ambulatory Visit: Payer: Self-pay | Admitting: Physician Assistant

## 2024-04-24 ENCOUNTER — Ambulatory Visit

## 2024-04-24 ENCOUNTER — Inpatient Hospital Stay (HOSPITAL_BASED_OUTPATIENT_CLINIC_OR_DEPARTMENT_OTHER): Admitting: Internal Medicine

## 2024-04-24 ENCOUNTER — Telehealth: Payer: Self-pay

## 2024-04-24 ENCOUNTER — Inpatient Hospital Stay: Attending: Internal Medicine

## 2024-04-24 VITALS — BP 120/77 | HR 84 | Temp 97.8°F | Resp 17 | Wt 124.5 lb

## 2024-04-24 DIAGNOSIS — Z9221 Personal history of antineoplastic chemotherapy: Secondary | ICD-10-CM | POA: Insufficient documentation

## 2024-04-24 DIAGNOSIS — C3412 Malignant neoplasm of upper lobe, left bronchus or lung: Secondary | ICD-10-CM

## 2024-04-24 DIAGNOSIS — Z79899 Other long term (current) drug therapy: Secondary | ICD-10-CM | POA: Diagnosis not present

## 2024-04-24 DIAGNOSIS — K208 Other esophagitis without bleeding: Secondary | ICD-10-CM | POA: Diagnosis not present

## 2024-04-24 DIAGNOSIS — C3490 Malignant neoplasm of unspecified part of unspecified bronchus or lung: Secondary | ICD-10-CM

## 2024-04-24 DIAGNOSIS — Z923 Personal history of irradiation: Secondary | ICD-10-CM | POA: Diagnosis not present

## 2024-04-24 LAB — CMP (CANCER CENTER ONLY)
ALT: 21 U/L (ref 0–44)
AST: 14 U/L — ABNORMAL LOW (ref 15–41)
Albumin: 3.6 g/dL (ref 3.5–5.0)
Alkaline Phosphatase: 54 U/L (ref 38–126)
Anion gap: 6 (ref 5–15)
BUN: 12 mg/dL (ref 8–23)
CO2: 26 mmol/L (ref 22–32)
Calcium: 9.1 mg/dL (ref 8.9–10.3)
Chloride: 111 mmol/L (ref 98–111)
Creatinine: 0.56 mg/dL (ref 0.44–1.00)
GFR, Estimated: >60 mL/min (ref >60)
Glucose, Bld: 86 mg/dL (ref 70–99)
Potassium: 3.5 mmol/L (ref 3.5–5.1)
Sodium: 143 mmol/L (ref 135–145)
Total Bilirubin: 0.2 mg/dL (ref 0.0–1.2)
Total Protein: 6.5 g/dL (ref 6.5–8.1)

## 2024-04-24 LAB — MAGNESIUM: Magnesium: 1.5 mg/dL — ABNORMAL LOW (ref 1.7–2.4)

## 2024-04-24 LAB — CBC WITH DIFFERENTIAL (CANCER CENTER ONLY)
Abs Immature Granulocytes: 0.1 K/uL — ABNORMAL HIGH (ref 0.00–0.07)
Basophils Absolute: 0 K/uL (ref 0.0–0.1)
Basophils Relative: 1 %
Eosinophils Absolute: 0 K/uL (ref 0.0–0.5)
Eosinophils Relative: 0 %
HCT: 25.3 % — ABNORMAL LOW (ref 36.0–46.0)
Hemoglobin: 8.2 g/dL — ABNORMAL LOW (ref 12.0–15.0)
Immature Granulocytes: 2 %
Lymphocytes Relative: 13 %
Lymphs Abs: 0.7 K/uL (ref 0.7–4.0)
MCH: 33.1 pg (ref 26.0–34.0)
MCHC: 32.4 g/dL (ref 30.0–36.0)
MCV: 102 fL — ABNORMAL HIGH (ref 80.0–100.0)
Monocytes Absolute: 1.4 K/uL — ABNORMAL HIGH (ref 0.1–1.0)
Monocytes Relative: 26 %
Neutro Abs: 3 K/uL (ref 1.7–7.7)
Neutrophils Relative %: 58 %
Platelet Count: 315 K/uL (ref 150–400)
RBC: 2.48 MIL/uL — ABNORMAL LOW (ref 3.87–5.11)
RDW: 21.9 % — ABNORMAL HIGH (ref 11.5–15.5)
WBC Count: 5.2 K/uL (ref 4.0–10.5)
nRBC: 0.8 % — ABNORMAL HIGH (ref 0.0–0.2)

## 2024-04-24 MED ORDER — MAGNESIUM OXIDE -MG SUPPLEMENT 400 (240 MG) MG PO TABS: 400.0000 mg | ORAL_TABLET | Freq: Every day | ORAL | 1 refills | Status: AC

## 2024-04-24 NOTE — Telephone Encounter (Signed)
 Spoke with patient regarding lab results. Per Cassie, PA, patient's magnesium  is low and a prescription was sent to [facility]. Patient requested that pharmacy be contacted to inquire about home delivery, as she does not have transportation to pick up the medication.  Contacted Penne at [facility] regarding the prescription. He stated that they typically do not deliver medications available over the counter unless accompanied by another prescription. However, he will check with the delivery driver currently out and, if unable to deliver, he will contact the patient directly. Confirmed correct patient phone number as [phone].

## 2024-04-24 NOTE — Progress Notes (Signed)
 [facility] [phone]   [phone]  OFFICE PROGRESS NOTE  Denise Rivas CROME, FNP [address] [place] [address]  DIAGNOSIS: Stage IIIA (T4, N0, M0) non-small cell lung cancer, squamous cell carcinoma presented with large left upper lobe lung mass with chest wall invasion diagnosed in January 2025.   PRIOR THERAPY: 1) neoadjuvant chemoimmunotherapy with carboplatin , paclitaxel  and nivolumab  every 3 weeks for 4 cycles discontinued secondary to disease progression. 2) A course of concurrent chemoradiation with cisplatin  50 mg/M2 on days 1 and 8 in addition to etoposide  50 mg/M2 on days 1-5 every 4 weeks status post 2 cycles.  CURRENT THERAPY: None  INTERVAL HISTORY: Denise Rivas 63 y.o. female returns to the clinic today for follow-up visit. Discussed the use of AI scribe software for clinical note transcription with the patient, who gave verbal consent to proceed.  History of Present Illness Denise Rivas is a 63 year old female with lung cancer who presents for evaluation with repeat CT scan of the chest for restaging of her disease.  She has a history of lung cancer and was previously not a good surgical candidate. She underwent a course of concurrent chemoradiation with cisplatin  and etoposide , completing two cycles.  She experiences burning when eating and swallowing.  A recent CT scan showed mild improvement in her cancer, with no further growth and slight shrinkage. She is concerned about the persistence of her cancer, initially believing it was gone.  She is currently in stage three of her cancer, which she initially thought was stage two.     MEDICAL HISTORY: Past Medical History:  Diagnosis Date   Anxiety    Hypercholesteremia    Hypertension    Lung cancer (HCC)    Neurofibromatosis, type 1 [facility])    saw Dermatology [facility] 2023    ALLERGIES:  has no known allergies.  MEDICATIONS:  Current  Outpatient Medications  Medication Sig Dispense Refill   amLODipine (NORVASC) 5 MG tablet Take 5 mg by mouth in the morning.     aspirin EC 81 MG tablet Take 81 mg by mouth in the morning.     fluticasone (FLONASE) 50 MCG/ACT nasal spray Place 1 spray into both nostrils daily as needed for allergies.     Fosaprepitant  Dimeglumine (EMEND IV) Inject into the vein.     gabapentin  (NEURONTIN ) 300 MG capsule Take 1 capsule (300 mg total) by mouth See admin instructions. Take 1 capsule (300 mg) by mouth scheduled at bedtime, may take up to 2 additional dose if needed for pain. 90 capsule 11   hydrOXYzine (ATARAX) 25 MG tablet Take 25 mg by mouth daily as needed for anxiety.     ibuprofen (ADVIL) 200 MG tablet Take 400 mg by mouth every 8 (eight) hours as needed (pain). (Patient not taking: Reported on 03/28/2024)     Lactulose  20 GM/30ML SOLN Take 30 mLs (20 g total) by mouth daily as needed. Take 30 ml by mouth every 3 hours until you have a bowel movement, then use daily as needed for constipation 450 mL 1   lidocaine -prilocaine  (EMLA ) cream Apply 1 Application topically as needed. 30 g 0   megestrol  (MEGACE ) 40 MG/ML suspension Take 10 mLs (400 mg total) by mouth 2 (two) times daily. 480 mL 2   mirtazapine  (REMERON  SOL-TAB) 45 MG disintegrating tablet Take 1 tablet (45 mg total) by mouth at bedtime. 30 tablet 2   naproxen sodium (ALEVE)  220 MG tablet Take 220-440 mg by mouth 2 (two) times daily as needed (pain.).     ondansetron  (ZOFRAN ) 4 MG tablet Take 1 tablet (4 mg total) by mouth every 8 (eight) hours as needed. 30 tablet 1   oxyCODONE -acetaminophen  (PERCOCET) 10-325 MG tablet Take 1 tablet by mouth every 12 (twelve) hours as needed for pain. 60 tablet 0   Palonosetron  HCl (ALOXI  IV) Inject into the vein.     potassium chloride  SA (KLOR-CON  M) 20 MEQ tablet Take 1 tablet (20 mEq total) by mouth daily. 30 tablet 5   pravastatin (PRAVACHOL) 40 MG tablet Take 40 mg by mouth daily.      prochlorperazine  (COMPAZINE ) 10 MG tablet Take 1 tablet (10 mg total) by mouth every 6 (six) hours as needed for nausea or vomiting. 60 tablet 2   sertraline (ZOLOFT) 100 MG tablet Take 100 mg by mouth in the morning.     sucralfate  (CARAFATE ) 1 g tablet Take 1 tablet (1 g total) by mouth 4 (four) times daily -  with meals and at bedtime. Crush 1 tablet in 1 oz water and drink 5 min before meals for radiation induced esophagitis 120 tablet 2   No current facility-administered medications for this visit.    SURGICAL HISTORY:  Past Surgical History:  Procedure Laterality Date   ABDOMINAL HYSTERECTOMY     PORTACATH PLACEMENT Right 10/12/2023   Procedure: INSERTION PORT-A-CATH;  Surgeon: Kallie Manuelita BROCKS, MD;  Location: [facility];  Service: General;  Laterality: Right;    REVIEW OF SYSTEMS:  Constitutional: positive for fatigue Eyes: negative Ears, nose, mouth, throat, and face: negative Respiratory: negative Cardiovascular: negative Gastrointestinal: positive for odynophagia Genitourinary:negative Integument/breast: negative Hematologic/lymphatic: negative Musculoskeletal:negative Neurological: negative Behavioral/Psych: negative Endocrine: negative Allergic/Immunologic: negative   PHYSICAL EXAMINATION: General appearance: alert, cooperative, fatigued, and no distress Head: Normocephalic, without obvious abnormality, atraumatic Neck: no adenopathy, no JVD, supple, symmetrical, trachea midline, and thyroid  not enlarged, symmetric, no tenderness/mass/nodules Lymph nodes: Cervical, supraclavicular, and axillary nodes normal. Resp: clear to auscultation bilaterally Back: symmetric, no curvature. ROM normal. No CVA tenderness. Cardio: regular rate and rhythm, S1, S2 normal, no murmur, click, rub or gallop GI: soft, non-tender; bowel sounds normal; no masses,  no organomegaly Extremities: extremities normal, atraumatic, no cyanosis or edema Neurologic: Alert and oriented X 3, normal  strength and tone. Normal symmetric reflexes. Normal coordination and gait  ECOG PERFORMANCE STATUS: 1 - Symptomatic but completely ambulatory  Blood pressure 120/77, pulse 84, temperature 97.8 F (36.6 C), resp. rate 17, weight 124 lb 8 oz (56.5 kg), SpO2 100%.  LABORATORY DATA: Lab Results  Component Value Date   WBC 5.6 04/01/2024   HGB 9.3 (L) 04/01/2024   HCT 27.2 (L) 04/01/2024   MCV 95.8 04/01/2024   PLT 238 04/01/2024      Chemistry      Component Value Date/Time   NA 139 04/01/2024 0800   K 3.5 04/01/2024 0800   CL 107 04/01/2024 0800   CO2 26 04/01/2024 0800   BUN 16 04/01/2024 0800   CREATININE 0.51 04/01/2024 0800   CREATININE 0.86 03/25/2024 0801      Component Value Date/Time   CALCIUM 8.6 (L) 04/01/2024 0800   ALKPHOS 48 04/01/2024 0800   AST 7 (L) 04/01/2024 0800   AST 10 (L) 03/25/2024 0801   ALT 8 04/01/2024 0800   ALT 11 03/25/2024 0801   BILITOT 0.5 04/01/2024 0800   BILITOT 0.3 03/25/2024 0801       RADIOGRAPHIC  STUDIES: CT Chest W Contrast Result Date: 04/20/2024 CLINICAL DATA:  Non-small-cell lung cancer, staging. * Tracking Code: BO * EXAM: CT CHEST WITH CONTRAST TECHNIQUE: Multidetector CT imaging of the chest was performed during intravenous contrast administration. RADIATION DOSE REDUCTION: This exam was performed according to the departmental dose-optimization program which includes automated exposure control, adjustment of the mA and/or kV according to patient size and/or use of iterative reconstruction technique. CONTRAST:  75mL OMNIPAQUE  IOHEXOL  300 MG/ML  SOLN COMPARISON:  Multiple priors including CT January 29, 2024 FINDINGS: Cardiovascular: Aortic atherosclerosis. Normal size heart. Trace pericardial effusion. Mediastinum/Nodes: No suspicious thyroid  nodule. No pathologically enlarged mediastinal, hilar or axillary lymph nodes. Lungs/Pleura: Large complex mass in the anterior left upper lobe which extends into the chest wall involving the  anterior left 2nd-4th ribs and the pectoral muscles measures 9.7 x 6.9 cm on image 48/4 previously 10.1 x 7.6 cm. No new suspicious pulmonary nodules or masses. Emphysema. Multifocal atelectasis/scarring. Biapical pleuroparenchymal scarring. Mild diffuse bronchial wall thickening. Upper Abdomen: Fluid density lesion in the right renal sinus is compatible with a cyst. Musculoskeletal: Dextroconvex curvature of the thoracolumbar spine. IMPRESSION: 1. Slight interval decrease in size of the large complex mass in the anterior left upper lobe which extends into the chest wall involving the anterior left 2nd-4th ribs and the pectoral muscles. 2. No evidence of new or progressive disease in the chest. Aortic Atherosclerosis (ICD10-I70.0) and Emphysema (ICD10-J43.9). Electronically Signed   By: Reyes Holder M.D.   On: 04/20/2024 08:05    ASSESSMENT AND PLAN: This is a very pleasant 63 years old African-American female with Stage IIIA (T4, N0, M0) non-small cell lung cancer, squamous cell carcinoma presented with large left upper lobe lung mass with chest wall invasion diagnosed in January 2025.  She has status post neoadjuvant chemoimmunotherapy with carboplatin , paclitaxel  and nivolumab  every 3 weeks for 4 cycles discontinued secondary to disease progression. She also underwent a course of concurrent chemoradiation with cisplatin  50 mg/M2 on days 1 and 8 in addition to etoposide  50 mg/M2 on days 1-5 every 4 weeks status post 2 cycles. She had repeat CT scan of the chest performed recently.  I personally independently reviewed the scan images and discussed the result and showed the images to the patient today.  Her scan showed slight improvement of her disease.  Assessment and Plan Assessment & Plan Stage 3 left lung cancer Stage 3 left lung cancer with slight improvement post two cycles of concurrent chemoradiation with cisplatin  and etoposide . The tumor has not grown further and has shown mild shrinkage. The  cancer remains localized without further spread. She is not a surgical candidate and will require further treatment to manage the disease. - Refer to Dr. Davonna in [place] for continuation of treatment - Initiate immunotherapy with durvalumab in [place] - Provide her with a copy of the CT scan and images - Send a message to Dr. 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 She experiences burning sensation when eating and swallowing due to inflammation of the esophagus from radiation exposure. The patient was advised to call if she has any other concerning symptoms in the interval. The patient voices understanding of current disease status and treatment options and is in agreement with the current care plan.  All questions were answered. The patient knows to call the clinic with any problems, questions or concerns. We can certainly see the patient much sooner if necessary.  The total time  spent in the appointment was 30 minutes including review of chart and various tests results, discussions about plan of care and coordination of care plan .   Disclaimer: This note was dictated with voice recognition software. Similar sounding words can inadvertently be transcribed and may not be corrected upon review.

## 2024-04-25 ENCOUNTER — Telehealth: Payer: Self-pay

## 2024-04-25 NOTE — Telephone Encounter (Signed)
 Spoke with patient in regards to receiving magnesium  supplement.  She states that magnesium  is going to be delivered this evening per [employer].

## 2024-04-30 NOTE — Progress Notes (Unsigned)
 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 female was transferred to my care after her prior physician has left.   ASSESSMENT & PLAN:   Assessment & Plan: Denise Rivas  is a 63 y.o. 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  Extensive oncology history as below S/p neoadjuvant chemotherapy with carboplatin , paclitaxel  and nivolumab  with progression Recently treated with chemo RT with cisplatin  and etoposide  at [facility] Restaging CT scan with good response to treatment  - We reviewed the CT scan results together and discussed good response in detail. - Discussed that the patient completed chemo RT as per PACIFIC trial followed by consolidation durvalumab since she has good response to chemo RT  -The PACIFIC trial demonstrated that consolidation therapy with durvalumab after chemoradiotherapy in unresectable stage III non-small-cell lung cancer (NSCLC) significantly improved both progression-free survival (PFS) and overall survival (OS). At 5 years, the estimated OS rate was 42.9% for the durvalumab group compared to 33.4% for the placebo group, and the estimated PFS rate was 33.1% versus 19.0%, respectively.  - Discussed most common expected side effects with chemoradiotherapy with carboplatin  and paclitaxel  commonly causes hematologic toxicities, including grade 3/4 neutropenia, anemia, and thrombocytopenia, with neutropenic fever occurring in a minority of cases. Non-hematologic side effects may include nausea, vomiting,  fatigue, and peripheral neuropathy, which can be dose-limiting. Serious adverse events are rare but may involve allergic reactions, renal toxicity, or mucositis.  -Patient is in agreement to proceed with this treatment -Will repeat CT scan in 3 months  Return to clinic prior to second cycle to assess for tolerance    The patient understands the plans discussed today and is in agreement with them.  She knows to contact our office if she develops concerns prior to her next appointment.  60 minutes of total time was spent for this patient encounter, including preparation,review of records,  face-to-face counseling with the patient and coordination of care, physical exam, and documentation of the encounter.   Mickiel Dry, MD  [facility] [place] - A DEPT OF [facility] [address] [place] [address] Dept: [phone] Dept Fax: [phone]   Orders Placed This Encounter  Procedures   CBC with Differential    Standing Status:   Future    Expected Date:   05/08/2024    Expiration Date:   05/08/2025   Comprehensive metabolic panel    Standing Status:   Future    Expected Date:   05/08/2024    Expiration Date:   05/08/2025   T4    Standing Status:   Future    Expected Date:   05/08/2024    Expiration Date:   05/08/2025   TSH    Standing Status:   Future    Expected Date:   05/08/2024    Expiration Date:   05/08/2025   CBC with Differential    Standing Status:   Future    Expected Date:   06/05/2024    Expiration Date:  06/05/2025   Comprehensive metabolic panel    Standing Status:   Future    Expected Date:   06/05/2024    Expiration Date:   06/05/2025     ONCOLOGY HISTORY:   I have reviewed her chart and materials related to her cancer extensively and collaborated history with the patient. 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 This mass involves the anterior pleural surface, with direct invasion and erosion of the inner cortex of the left anterior third rib. -09/01/2023: PET: 7 cm anterior left upper lobe lung mass is markedly hypermetabolic and is invading the left chest wall with destruction of the left third anterior rib. No enlarged or hypermetabolic mediastinal or hilar lymph nodes. - 09/01/2023: MRI brain: No evidence of metastatic disease to the brain. -09/11/2023: Lung, left upper lobe mass, core biopsy:  -Invasive moderately differentiated squamous cell carcinoma.  -09/26/2023: [facility] NGS: PD-L1: Positive, TPS: 2%, TMB: High,11mut/Mb.  - BRAF, ALK, EGFR, KRAS, MET, RET, ROS1: Negative  - PIK3CA, TP53: Pathogenic variant present -10/12/2023: Port placed -10/31/2023-  01/02/2024:Carboplatin  + Paclitaxel  + nivolumab  (4 cycles) - 01/29/2024: CT chest: Continued interval increase in the size of large heterogeneous mass centered in the left upper lobe, currently measuring 7.6 x 10.1 cm. The mass is invading the left upper anterior chest wall with destruction of left second through fourth ribs anteromedially.No new lung mass or consolidation. No new or suspicious lung nodule. No mediastinal or hilar lymphadenopathy by size criteria. -01/30/2024: Evaluated by Dr.Hendrickson(thoracic surgery): Not a candidate for surgery and patient also refused surgery -02/20/2024- 04/01/2024: Chemo RT with Cisplatin  and etoposide   x 2 cycles. Last RT on 04/04/2024. (Done at [facility] for convenience for the patient) -04/17/2024: CT chest: Slight interval decrease in size of the large complex mass in the anterior left upper lobe which extends into the chest wall involving the anterior left 2nd-4th ribs and the pectoral muscles. No evidence of  new or progressive disease in the chest.  Current Treatment:  Adjuvant Durvalumab planned  INTERVAL HISTORY:   Fedora Knisely Jester is here today for follow up and to establish care with me for non small cell lung carcinoma.   Patient has a history of neurofibromatosis, hypertension and was a longtime tobacco smoker and quit several years ago.  She recently completed a course of chemo radiation for her cancer and is feeling well.  No current symptoms of rashes, thyroid  problems, diarrhea, abdominal pain, or difficulty breathing.  Transportation for treatment was previously a concern, particularly during radiation therapy, but she now has reliable transportation arrangements.  She denies any history of autoimmune conditions such as rheumatoid arthritis or lupus and is not on medications like steroids or prednisone.  She lives alone and enjoys the independence it provides. She does not smoke.  I have reviewed the past medical history, past surgical history, social history and family history with the patient and they are unchanged from previous note.  ALLERGIES:  has no known allergies.  MEDICATIONS:  Current Outpatient Medications  Medication Sig Dispense Refill   amLODipine (NORVASC) 5 MG tablet Take 5 mg by mouth in the morning.     aspirin EC 81 MG tablet Take 81 mg by mouth in the morning.  fluticasone (FLONASE) 50 MCG/ACT nasal spray Place 1 spray into both nostrils daily as needed for allergies.     Fosaprepitant  Dimeglumine (EMEND IV) Inject into the vein.     gabapentin  (NEURONTIN ) 300 MG capsule Take 1 capsule (300 mg total) by mouth See admin instructions. Take 1 capsule (300 mg) by mouth scheduled at bedtime, may take up to 2 additional dose if needed for pain. 90 capsule 11   hydrOXYzine (ATARAX) 25 MG tablet Take 25 mg by mouth daily as needed for anxiety.     ibuprofen (ADVIL) 200 MG tablet Take 400 mg by mouth every 8 (eight) hours as needed (pain).     Lactulose  20  GM/30ML SOLN Take 30 mLs (20 g total) by mouth daily as needed. Take 30 ml by mouth every 3 hours until you have a bowel movement, then use daily as needed for constipation 450 mL 1   lidocaine -prilocaine  (EMLA ) cream Apply 1 Application topically as needed. 30 g 0   magnesium  oxide (MAG-OX) 400 (240 Mg) MG tablet Take 1 tablet (400 mg total) by mouth daily. 30 tablet 1   megestrol  (MEGACE ) 40 MG/ML suspension Take 10 mLs (400 mg total) by mouth 2 (two) times daily. 480 mL 2   mirtazapine  (REMERON  SOL-TAB) 45 MG disintegrating tablet Take 1 tablet (45 mg total) by mouth at bedtime. 30 tablet 2   naproxen sodium (ALEVE) 220 MG tablet Take 220-440 mg by mouth 2 (two) times daily as needed (pain.).     ondansetron  (ZOFRAN ) 4 MG tablet Take 1 tablet (4 mg total) by mouth every 8 (eight) hours as needed. 30 tablet 1   oxyCODONE -acetaminophen  (PERCOCET) 10-325 MG tablet Take 1 tablet by mouth every 12 (twelve) hours as needed for pain. 60 tablet 0   Palonosetron  HCl (ALOXI  IV) Inject into the vein.     potassium chloride  SA (KLOR-CON  M) 20 MEQ tablet Take 1 tablet (20 mEq total) by mouth daily. 30 tablet 5   pravastatin (PRAVACHOL) 40 MG tablet Take 40 mg by mouth daily.     prochlorperazine  (COMPAZINE ) 10 MG tablet Take 1 tablet (10 mg total) by mouth every 6 (six) hours as needed for nausea or vomiting. 60 tablet 2   sertraline (ZOLOFT) 100 MG tablet Take 100 mg by mouth in the morning.     sucralfate  (CARAFATE ) 1 g tablet Take 1 tablet (1 g total) by mouth 4 (four) times daily -  with meals and at bedtime. Crush 1 tablet in 1 oz water and drink 5 min before meals for radiation induced esophagitis 120 tablet 2   No current facility-administered medications for this visit.    REVIEW OF SYSTEMS:   Constitutional: Denies fevers, chills or abnormal weight loss Eyes: Denies blurriness of vision Ears, nose, mouth, throat, and face: Denies mucositis or sore throat Respiratory: Denies cough, dyspnea or  wheezes Cardiovascular: Denies palpitation, chest discomfort or lower extremity swelling Gastrointestinal:  Denies nausea, heartburn or change in bowel habits Skin: Denies abnormal skin rashes Lymphatics: Denies new lymphadenopathy or easy bruising Neurological:Denies numbness, tingling or new weaknesses Behavioral/Psych: Mood is stable, no new changes  All other systems were reviewed with the patient and are negative.   VITALS:  Blood pressure 129/86, pulse 89, temperature 98.2 F (36.8 C), temperature source Oral, resp. rate 18, weight 123 lb 3.2 oz (55.9 kg), SpO2 100%.  Wt Readings from Last 3 Encounters:  05/01/24 123 lb 3.2 oz (55.9 kg)  04/24/24 124 lb 8 oz (56.5 kg)  04/01/24 128 lb (58.1 kg)    Body mass index is 23.28 kg/m.  Performance status (ECOG): 1 - Symptomatic but completely ambulatory  PHYSICAL EXAM:   GENERAL:alert, no distress and comfortable SKIN: skin color, texture, turgor are normal, no rashes or significant lesions, port in place LYMPH:  no palpable lymphadenopathy in the cervical, axillary or inguinal LUNGS: clear to auscultation and percussion with normal breathing effort HEART: regular rate & rhythm and no murmurs and no lower extremity edema ABDOMEN:abdomen soft, non-tender and normal bowel sounds Musculoskeletal:no cyanosis of digits and no clubbing  NEURO: alert & oriented x 3 with fluent speech, no focal motor/sensory deficits  LABORATORY DATA:  I have reviewed the data as listed  Lab Results  Component Value Date   WBC 5.2 04/24/2024   NEUTROABS 3.0 04/24/2024   HGB 8.2 (L) 04/24/2024   HCT 25.3 (L) 04/24/2024   MCV 102.0 (H) 04/24/2024   PLT 315 04/24/2024      Chemistry      Component Value Date/Time   NA 143 04/24/2024 0833   K 3.5 04/24/2024 0833   CL 111 04/24/2024 0833   CO2 26 04/24/2024 0833   BUN 12 04/24/2024 0833   CREATININE 0.56 04/24/2024 0833      Component Value Date/Time   CALCIUM 9.1 04/24/2024 0833    ALKPHOS 54 04/24/2024 0833   AST 14 (L) 04/24/2024 0833   ALT 21 04/24/2024 0833   BILITOT 0.2 04/24/2024 0833        RADIOGRAPHIC STUDIES: I have personally reviewed the radiological images as listed and agreed with the findings in the report. [place] CT Chest W Contrast CLINICAL DATA:  Non-small-cell lung cancer, staging. * Tracking Code: BO *  EXAM: CT CHEST WITH CONTRAST  TECHNIQUE: Multidetector CT imaging of the chest was performed during intravenous contrast administration.  RADIATION DOSE REDUCTION: This exam was performed according to the departmental dose-optimization program which includes automated exposure control, adjustment of the mA and/or kV according to patient size and/or use of iterative reconstruction technique.  CONTRAST:  75mL OMNIPAQUE  IOHEXOL  300 MG/ML  SOLN  COMPARISON:  Multiple priors including CT January 29, 2024  FINDINGS: Cardiovascular: Aortic atherosclerosis. Normal size heart. Trace pericardial effusion.  Mediastinum/Nodes: No suspicious thyroid  nodule. No pathologically enlarged mediastinal, hilar or axillary lymph nodes.  Lungs/Pleura: Large complex mass in the anterior left upper lobe which extends into the chest wall involving the anterior left 2nd-4th ribs and the pectoral muscles measures 9.7 x 6.9 cm on image 48/4 previously 10.1 x 7.6 cm.  No new suspicious pulmonary nodules or masses.  Emphysema. Multifocal atelectasis/scarring. Biapical pleuroparenchymal scarring. Mild diffuse bronchial wall thickening.  Upper Abdomen: Fluid density lesion in the right renal sinus is compatible with a cyst.  Musculoskeletal: Dextroconvex curvature of the thoracolumbar spine.  IMPRESSION: 1. Slight interval decrease in size of the large complex mass in the anterior left upper lobe which extends into the chest wall involving the anterior left 2nd-4th ribs and the pectoral muscles. 2. No evidence of new or progressive disease in the  chest.  Aortic Atherosclerosis (ICD10-I70.0) and Emphysema (ICD10-J43.9).  Electronically Signed   By: Reyes Holder M.D.   On: 04/20/2024 08:05

## 2024-05-01 ENCOUNTER — Inpatient Hospital Stay: Attending: Radiation Oncology | Admitting: Oncology

## 2024-05-01 VITALS — BP 129/86 | HR 89 | Temp 98.2°F | Resp 18 | Wt 123.2 lb

## 2024-05-01 DIAGNOSIS — I700 Atherosclerosis of aorta: Secondary | ICD-10-CM | POA: Insufficient documentation

## 2024-05-01 DIAGNOSIS — J439 Emphysema, unspecified: Secondary | ICD-10-CM | POA: Insufficient documentation

## 2024-05-01 DIAGNOSIS — Z923 Personal history of irradiation: Secondary | ICD-10-CM | POA: Diagnosis not present

## 2024-05-01 DIAGNOSIS — C3412 Malignant neoplasm of upper lobe, left bronchus or lung: Secondary | ICD-10-CM | POA: Insufficient documentation

## 2024-05-01 DIAGNOSIS — Z5112 Encounter for antineoplastic immunotherapy: Secondary | ICD-10-CM | POA: Insufficient documentation

## 2024-05-01 DIAGNOSIS — Z79899 Other long term (current) drug therapy: Secondary | ICD-10-CM | POA: Insufficient documentation

## 2024-05-01 DIAGNOSIS — Z9221 Personal history of antineoplastic chemotherapy: Secondary | ICD-10-CM | POA: Insufficient documentation

## 2024-05-01 NOTE — Progress Notes (Signed)
 DISCONTINUE ON PATHWAY REGIMEN - Non-Small Cell Lung     A cycle is every 28 days:     Cisplatin       Etoposide    **Always confirm dose/schedule in your pharmacy ordering system**  REASON: Continuation Of Treatment PRIOR TREATMENT: [id number]: Cisplatin  50 mg/m2 Days 1, 8 + Etoposide  50 mg/m2 IV Days 1-5 q28 Days x 2 Cycles + Concurrent RT TREATMENT RESPONSE: Partial Response (PR)  START ON PATHWAY REGIMEN - Non-Small Cell Lung     A cycle is every 28 days:     Durvalumab   **Always confirm dose/schedule in your pharmacy ordering system**  Patient Characteristics: Preoperative or Nonsurgical Candidate (Clinical Staging), Stage IIB (N2a only) or Stage III - Nonsurgical Candidate, PS = 0,1 Therapeutic Status: Preoperative or Nonsurgical Candidate (Clinical Staging) AJCC T Category: cT4 AJCC N Category: cN0 AJCC M Category: cM0 AJCC 9 Stage Grouping: IIIA Check here if patient was staged using an edition other than AJCC Staging 9th Edition: true ECOG Performance Status: 1 Intent of Therapy: Curative Intent, Discussed with Patient

## 2024-05-01 NOTE — Assessment & Plan Note (Addendum)
 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  Extensive oncology history as below S/p neoadjuvant chemotherapy with carboplatin , paclitaxel  and nivolumab  with progression Recently treated with chemo RT with cisplatin  and etoposide  at [facility] Restaging CT scan with good response to treatment  - We reviewed the CT scan results together and discussed good response in detail. - Discussed that the patient completed chemo RT as per PACIFIC trial followed by consolidation durvalumab since she has good response to chemo RT  -The PACIFIC trial demonstrated that consolidation therapy with durvalumab after chemoradiotherapy in unresectable stage III non-small-cell lung cancer (NSCLC) significantly improved both progression-free survival (PFS) and overall survival (OS). At 5 years, the estimated OS rate was 42.9% for the durvalumab group compared to 33.4% for the placebo group, and the estimated PFS rate was 33.1% versus 19.0%, respectively.  - Discussed most common expected side effects with chemoradiotherapy with carboplatin  and paclitaxel  commonly causes hematologic toxicities, including grade 3/4 neutropenia, anemia, and thrombocytopenia, with neutropenic fever occurring in a minority of cases. Non-hematologic side effects may include nausea, vomiting, fatigue, and peripheral neuropathy, which can be dose-limiting. Serious adverse events are rare but may involve allergic reactions, renal toxicity, or mucositis.  -Patient is in agreement to proceed with this treatment -Will repeat CT scan in 3 months  Return to clinic prior to second cycle to assess for tolerance

## 2024-05-01 NOTE — Patient Instructions (Signed)
 [facility] at [facility] Discharge Instructions   You were seen and examined today by Dr. Davonna.  She reviewed the results of your lab work which are normal/stable.   She discussed with starting you on an immunotherapy drug called Imfinzi. This is given in the clinic once every 4 weeks. We will give this for a total of 1 year.   We will proceed with your treatment next week.   Return as scheduled.    Thank you for choosing [facility] at [facility] to provide your oncology and hematology care.  To afford each patient quality time with our provider, please arrive at least 15 minutes before your scheduled appointment time.   If you have a lab appointment with the Cancer Center please come in thru the Main Entrance and check in at the main information desk.  You need to re-schedule your appointment should you arrive 10 or more minutes late.  We strive to give you quality time with our providers, and arriving late affects you and other patients whose appointments are after yours.  Also, if you no show three or more times for appointments you may be dismissed from the clinic at the providers discretion.     Again, thank you for choosing [facility].  Our hope is that these requests will decrease the amount of time that you wait before being seen by our physicians.       _____________________________________________________________  Should you have questions after your visit to [facility], please contact our office at [phone] and follow the prompts.  Our office hours are 8:00 a.m. and 4:30 p.m. Monday - Friday.  Please note that voicemails left after 4:00 p.m. may not be returned until the following business day.  We are closed weekends and major holidays.  You do have access to a nurse 24-7, just call the main number to the clinic [phone] and do not press any options, hold on the line and a nurse will answer the  phone.    For prescription refill requests, have your pharmacy contact our office and allow 72 hours.    Due to Covid, you will need to wear a mask upon entering the hospital. If you do not have a mask, a mask will be given to you at the Main Entrance upon arrival. For doctor visits, patients may have 1 support person age 60 or older with them. For treatment visits, patients can not have anyone with them due to social distancing guidelines and our immunocompromised population.

## 2024-05-02 ENCOUNTER — Other Ambulatory Visit: Payer: Self-pay

## 2024-05-04 ENCOUNTER — Other Ambulatory Visit: Payer: Self-pay

## 2024-05-06 ENCOUNTER — Ambulatory Visit
Admission: RE | Admit: 2024-05-06 | Discharge: 2024-05-06 | Disposition: A | Discharge status: Home / Self Care | Source: Ambulatory Visit | Attending: Radiation Oncology | Admitting: Radiation Oncology

## 2024-05-06 ENCOUNTER — Other Ambulatory Visit: Payer: Self-pay | Admitting: *Deleted

## 2024-05-06 DIAGNOSIS — C3412 Malignant neoplasm of upper lobe, left bronchus or lung: Secondary | ICD-10-CM

## 2024-05-06 MED ORDER — PROCHLORPERAZINE MALEATE 10 MG PO TABS: 10.0000 mg | ORAL_TABLET | Freq: Four times a day (QID) | ORAL | 2 refills | Status: DC | PRN

## 2024-05-07 ENCOUNTER — Encounter: Payer: Self-pay | Admitting: Oncology

## 2024-05-08 ENCOUNTER — Other Ambulatory Visit

## 2024-05-08 ENCOUNTER — Ambulatory Visit

## 2024-05-09 ENCOUNTER — Other Ambulatory Visit: Payer: Self-pay | Admitting: Oncology

## 2024-05-09 ENCOUNTER — Inpatient Hospital Stay

## 2024-05-09 VITALS — BP 128/71 | HR 89 | Temp 98.1°F | Resp 18

## 2024-05-09 DIAGNOSIS — C3412 Malignant neoplasm of upper lobe, left bronchus or lung: Secondary | ICD-10-CM

## 2024-05-09 DIAGNOSIS — Z5112 Encounter for antineoplastic immunotherapy: Secondary | ICD-10-CM | POA: Diagnosis not present

## 2024-05-09 DIAGNOSIS — E876 Hypokalemia: Secondary | ICD-10-CM

## 2024-05-09 LAB — CBC WITH DIFFERENTIAL/PLATELET
Abs Immature Granulocytes: 0.09 K/uL — ABNORMAL HIGH (ref 0.00–0.07)
Basophils Absolute: 0.1 K/uL (ref 0.0–0.1)
Basophils Relative: 1 %
Eosinophils Absolute: 0.1 K/uL (ref 0.0–0.5)
Eosinophils Relative: 1 %
HCT: 33.5 % — ABNORMAL LOW (ref 36.0–46.0)
Hemoglobin: 10.7 g/dL — ABNORMAL LOW (ref 12.0–15.0)
Immature Granulocytes: 1 %
Lymphocytes Relative: 10 %
Lymphs Abs: 0.8 K/uL (ref 0.7–4.0)
MCH: 34.6 pg — ABNORMAL HIGH (ref 26.0–34.0)
MCHC: 31.9 g/dL (ref 30.0–36.0)
MCV: 108.4 fL — ABNORMAL HIGH (ref 80.0–100.0)
Monocytes Absolute: 1.1 K/uL — ABNORMAL HIGH (ref 0.1–1.0)
Monocytes Relative: 14 %
Neutro Abs: 5.9 K/uL (ref 1.7–7.7)
Neutrophils Relative %: 73 %
Platelets: 325 K/uL (ref 150–400)
RBC: 3.09 MIL/uL — ABNORMAL LOW (ref 3.87–5.11)
RDW: 21.7 % — ABNORMAL HIGH (ref 11.5–15.5)
Smear Review: NORMAL
WBC: 8 K/uL (ref 4.0–10.5)
nRBC: 0 % (ref 0.0–0.2)

## 2024-05-09 LAB — COMPREHENSIVE METABOLIC PANEL WITH GFR
ALT: 20 U/L (ref 0–44)
AST: 21 U/L (ref 15–41)
Albumin: 3.7 g/dL (ref 3.5–5.0)
Alkaline Phosphatase: 55 U/L (ref 38–126)
Anion gap: 14 (ref 5–15)
BUN: 17 mg/dL (ref 8–23)
CO2: 19 mmol/L — ABNORMAL LOW (ref 22–32)
Calcium: 9.4 mg/dL (ref 8.9–10.3)
Chloride: 109 mmol/L (ref 98–111)
Creatinine, Ser: 0.69 mg/dL (ref 0.44–1.00)
GFR, Estimated: >60 mL/min (ref >60)
Glucose, Bld: 143 mg/dL — ABNORMAL HIGH (ref 70–99)
Potassium: 3 mmol/L — ABNORMAL LOW (ref 3.5–5.1)
Sodium: 142 mmol/L (ref 135–145)
Total Bilirubin: 0.6 mg/dL (ref 0.0–1.2)
Total Protein: 7.4 g/dL (ref 6.5–8.1)

## 2024-05-09 LAB — TSH: TSH: 0.398 u[IU]/mL (ref 0.350–4.500)

## 2024-05-09 LAB — MAGNESIUM: Magnesium: 1.8 mg/dL (ref 1.7–2.4)

## 2024-05-09 MED ORDER — PROCHLORPERAZINE MALEATE 10 MG PO TABS: 10.0000 mg | ORAL_TABLET | Freq: Four times a day (QID) | ORAL | 1 refills | Status: AC | PRN

## 2024-05-09 MED ORDER — SODIUM CHLORIDE 0.9 % IV SOLN
1500.0000 mg | Freq: Once | INTRAVENOUS | Status: AC
  Administered 2024-05-09: 1500 mg via INTRAVENOUS
  Filled 2024-05-09: qty 30

## 2024-05-09 MED ORDER — POTASSIUM CHLORIDE CRYS ER 20 MEQ PO TBCR
40.0000 meq | EXTENDED_RELEASE_TABLET | Freq: Once | ORAL | Status: AC
  Administered 2024-05-09: 40 meq via ORAL
  Filled 2024-05-09: qty 2

## 2024-05-09 MED ORDER — LIDOCAINE-PRILOCAINE 2.5-2.5 % EX CREA: TOPICAL_CREAM | CUTANEOUS | 3 refills | Status: AC

## 2024-05-09 MED ORDER — ONDANSETRON HCL 8 MG PO TABS: 8.0000 mg | ORAL_TABLET | Freq: Three times a day (TID) | ORAL | 1 refills | Status: AC | PRN

## 2024-05-09 MED ORDER — SODIUM CHLORIDE 0.9 % IV SOLN: INTRAVENOUS | Status: DC

## 2024-05-09 NOTE — Progress Notes (Signed)
 Pharmacist Chemotherapy Monitoring - Initial Assessment    Anticipated start date: 05/09/24   The following has been reviewed per standard work regarding the patient's treatment regimen: The patient's diagnosis, treatment plan and drug doses, and organ/hematologic function Lab orders and baseline tests specific to treatment regimen  The treatment plan start date, drug sequencing, and pre-medications Prior authorization status  Patient's documented medication list, including drug-drug interaction screen and prescriptions for anti-emetics and supportive care specific to the treatment regimen The drug concentrations, fluid compatibility, administration routes, and timing of the medications to be used The patient's access for treatment and lifetime cumulative dose history, if applicable  The patient's medication allergies and previous infusion related reactions, if applicable   Changes made to treatment plan:  N/A  Follow up needed:  N/A   Denise Rivas, [facility], 05/09/2024  8:19 AM

## 2024-05-09 NOTE — Progress Notes (Signed)
 Patient presents today for first time immunotherapy infusion of Imfinzi .  Patient is in satisfactory condition with no new complaints voiced.  Vital signs are stable.  Labs reviewed and all labs are within treatment parameters. Potassium 3.0, patient to get 40mEq of oral potassium per Dr Armanda orders. Consent signed. We will proceed with treatment per MD orders.    Patient c/o red area to back without itching or pain prior to treatment. Dr Davonna made aware.  Patient tolerated treatment well with no complaints voiced.  Patient left ambulatory in stable condition.  Vital signs stable at discharge.  Follow up as scheduled.

## 2024-05-09 NOTE — Patient Instructions (Signed)
 [facility] [place] - [facility] DE[facility] OF [facility]  Discharge Instructions: Thank you for choosing [facility] to provide your oncology and hematology care.  If you have a lab appointment with the Cancer Center - please note that after April 8th, 2024, all labs will be drawn in the cancer center.  You do not have to check in or register with the main entrance as you have in the past but will complete your check-in in the cancer center.  Wear comfortable clothing and clothing appropriate for easy access to any Portacath or PICC line.   We strive to give you quality time with your provider. You may need to reschedule your appointment if you arrive late (15 or more minutes).  Arriving late affects you and other patients whose appointments are after yours.  Also, if you miss three or more appointments without notifying the office, you may be dismissed from the clinic at the provider's discretion.      For prescription refill requests, have your pharmacy contact our office and allow 72 hours for refills to be completed.    Today you received the following chemotherapy and/or immunotherapy agents Imfinzi.  Durvalumab Injection What is this medication? DURVALUMAB (dur VAL ue mab) treats some types of cancer. It works by helping your immune system slow or stop the spread of cancer cells. It is a monoclonal antibody. This medicine may be used for other purposes; ask your health care provider or pharmacist if you have questions. COMMON BRAND NAME(S): IMFINZI What should I tell my care team before I take this medication? They need to know if you have any of these conditions: Allogeneic stem cell transplant (uses someone else's stem cells) Autoimmune diseases, such as Crohn disease, ulcerative colitis, lupus History of chest radiation Nervous system problems, such as Guillain-Barre syndrome, myasthenia gravis Organ transplant An unusual or allergic reaction to durvalumab,  other medications, foods, dyes, or preservatives Pregnant or trying to get pregnant Breast-feeding How should I use this medication? This medication is infused into a vein. It is given by your care team in a hospital or clinic setting. A special MedGuide will be given to you before each treatment. Be sure to read this information carefully each time. Talk to your care team about the use of this medication in children. Special care may be needed. Overdosage: If you think you have taken too much of this medicine contact a poison control center or emergency room at once. NOTE: This medicine is only for you. Do not share this medicine with others. What if I miss a dose? Keep appointments for follow-up doses. It is important not to miss your dose. Call your care team if you are unable to keep an appointment. What may interact with this medication? Interactions have not been studied. This list may not describe all possible interactions. Give your health care provider a list of all the medicines, herbs, non-prescription drugs, or dietary supplements you use. Also tell them if you smoke, drink alcohol, or use illegal drugs. Some items may interact with your medicine. What should I watch for while using this medication? Your condition will be monitored carefully while you are receiving this medication. You may need blood work while taking this medication. This medication may cause serious skin reactions. They can happen weeks to months after starting the medication. Contact your care team right away if you notice fevers or flu-like symptoms with a rash. The rash may be red or  purple and then turn into blisters or peeling of the skin. You may also notice a red rash with swelling of the face, lips, or lymph nodes in your neck or under your arms. Tell your care team right away if you have any change in your eyesight. Talk to your care team if you may be pregnant. Serious birth defects can occur if you take  this medication during pregnancy and for 3 months after the last dose. You will need a negative pregnancy test before starting this medication. Contraception is recommended while taking this medication and for 3 months after the last dose. Your care team can help you find the option that works for you. Do not breastfeed while taking this medication and for 3 months after the last dose. What side effects may I notice from receiving this medication? Side effects that you should report to your care team as soon as possible: Allergic reactions--skin rash, itching, hives, swelling of the face, lips, tongue, or throat Dry cough, shortness of breath or trouble breathing Eye pain, redness, irritation, or discharge with blurry or decreased vision Heart muscle inflammation--unusual weakness or fatigue, shortness of breath, chest pain, fast or irregular heartbeat, dizziness, swelling of the ankles, feet, or hands Hormone gland problems--headache, sensitivity to light, unusual weakness or fatigue, dizziness, fast or irregular heartbeat, increased sensitivity to cold or heat, excessive sweating, constipation, hair loss, increased thirst or amount of urine, tremors or shaking, irritability Infusion reactions--chest pain, shortness of breath or trouble breathing, feeling faint or lightheaded Kidney injury (glomerulonephritis)--decrease in the amount of urine, red or dark brown urine, foamy or bubbly urine, swelling of the ankles, hands, or feet Liver injury--right upper belly pain, loss of appetite, nausea, light-colored stool, dark yellow or brown urine, yellowing skin or eyes, unusual weakness or fatigue Pain, tingling, or numbness in the hands or feet, muscle weakness, change in vision, confusion or trouble speaking, loss of balance or coordination, trouble walking, seizures Rash, fever, and swollen lymph nodes Redness, blistering, peeling, or loosening of the skin, including inside the mouth Sudden or severe  stomach pain, bloody diarrhea, fever, nausea, vomiting Side effects that usually do not require medical attention (report these to your care team if they continue or are bothersome): Bone, joint, or muscle pain Diarrhea Fatigue Loss of appetite Nausea Skin rash This list may not describe all possible side effects. Call your doctor for medical advice about side effects. You may report side effects to FDA at 1-800-FDA-1088. Where should I keep my medication? This medication is given in a hospital or clinic. It will not be stored at home. NOTE: This sheet is a summary. It may not cover all possible information. If you have questions about this medicine, talk to your doctor, pharmacist, or health care provider.  2024 Elsevier/Gold Standard (2021-12-14 00:00:00)        To help prevent nausea and vomiting after your treatment, we encourage you to take your nausea medication as directed.  BELOW ARE SYMPTOMS THAT SHOULD BE REPORTED IMMEDIATELY: *FEVER GREATER THAN 100.4 F (38 C) OR HIGHER *CHILLS OR SWEATING *NAUSEA AND VOMITING THAT IS NOT CONTROLLED WITH YOUR NAUSEA MEDICATION *UNUSUAL SHORTNESS OF BREATH *UNUSUAL BRUISING OR BLEEDING *URINARY PROBLEMS (pain or burning when urinating, or frequent urination) *BOWEL PROBLEMS (unusual diarrhea, constipation, pain near the anus) TENDERNESS IN MOUTH AND THROAT WITH OR WITHOUT PRESENCE OF ULCERS (sore throat, sores in mouth, or a toothache) UNUSUAL RASH, SWELLING OR PAIN  UNUSUAL VAGINAL DISCHARGE OR ITCHING   Items  with * indicate a potential emergency and should be followed up as soon as possible or go to the Emergency Department if any problems should occur.  Please show the CHEMOTHERAPY ALERT CARD or IMMUNOTHERAPY ALERT CARD at check-in to the Emergency Department and triage nurse.  Should you have questions after your visit or need to cancel or reschedule your appointment, please contact [facility] [place] - A DEPT OF [facility] Brideg[facility] [phone]  and follow the prompts.  Office hours are 8:00 a.m. to 4:30 p.m. Monday - Friday. Please note that voicemails left after 4:00 p.m. may not be returned until the following business day.  We are closed weekends and major holidays. You have access to a nurse at all times for urgent questions. Please call the main number to the clinic [phone] and follow the prompts.  For any non-urgent questions, you may also contact your provider using MyChart. We now offer e-Visits for anyone 68 and older to request care online for non-urgent symptoms. For details visit [email or web address].   Also download the MyChart app! Go to the app store, search "MyChart", open the app, select [place], and log in with your MyChart username and password.

## 2024-05-10 ENCOUNTER — Telehealth: Payer: Self-pay

## 2024-05-10 ENCOUNTER — Encounter: Payer: Self-pay | Admitting: Oncology

## 2024-05-10 LAB — T4: T4, Total: 9.2 ug/dL (ref 4.5–12.0)

## 2024-05-10 NOTE — Progress Notes (Signed)
  Radiation Oncology         [phone]) [phone] ________________________________  Name: Denise Rivas MRN: [id number]  Date of Service: 05/06/2024  DOB: [birth year]  Post Treatment Telephone Note  Diagnosis: Locally Progressive Stage IIB, [id number], NSCLC, Squamous Cell Carcinoma.    The patient was available for call today.   Symptoms of fatigue have improved since completing therapy.  Symptoms of skin changes have improved since completing therapy.  Symptoms of esophagitis have improved since completing therapy.   The patient has scheduled follow up with her medical oncologist Dr. Davonna for ongoing care, and was encouraged to call if she develops concerns or questions regarding radiation.    Spoke with patient 05/10/24

## 2024-05-10 NOTE — Telephone Encounter (Signed)
 This looks like a duplicate. Dr. Davonna just sent this yesterday.

## 2024-05-10 NOTE — Telephone Encounter (Signed)
 Patient called for 24-hour follow up, patient answered then hung up the phone.

## 2024-05-19 ENCOUNTER — Other Ambulatory Visit: Payer: Self-pay

## 2024-05-26 ENCOUNTER — Other Ambulatory Visit: Payer: Self-pay | Admitting: Oncology

## 2024-05-27 ENCOUNTER — Encounter: Payer: Self-pay | Admitting: Oncology

## 2024-05-31 ENCOUNTER — Other Ambulatory Visit: Payer: Self-pay | Admitting: Oncology

## 2024-05-31 DIAGNOSIS — C3412 Malignant neoplasm of upper lobe, left bronchus or lung: Secondary | ICD-10-CM

## 2024-06-02 ENCOUNTER — Other Ambulatory Visit: Payer: Self-pay

## 2024-06-05 ENCOUNTER — Inpatient Hospital Stay: Attending: Radiation Oncology | Admitting: Oncology

## 2024-06-05 ENCOUNTER — Inpatient Hospital Stay

## 2024-06-05 ENCOUNTER — Encounter: Payer: Self-pay | Admitting: Oncology

## 2024-06-05 VITALS — BP 119/85 | HR 97 | Temp 96.2°F | Resp 18

## 2024-06-05 VITALS — HR 99

## 2024-06-05 DIAGNOSIS — Z9221 Personal history of antineoplastic chemotherapy: Secondary | ICD-10-CM | POA: Diagnosis not present

## 2024-06-05 DIAGNOSIS — Z5112 Encounter for antineoplastic immunotherapy: Secondary | ICD-10-CM | POA: Diagnosis present

## 2024-06-05 DIAGNOSIS — D539 Nutritional anemia, unspecified: Secondary | ICD-10-CM | POA: Insufficient documentation

## 2024-06-05 DIAGNOSIS — C3412 Malignant neoplasm of upper lobe, left bronchus or lung: Secondary | ICD-10-CM

## 2024-06-05 DIAGNOSIS — Z79899 Other long term (current) drug therapy: Secondary | ICD-10-CM | POA: Diagnosis not present

## 2024-06-05 DIAGNOSIS — D649 Anemia, unspecified: Secondary | ICD-10-CM | POA: Diagnosis not present

## 2024-06-05 LAB — COMPREHENSIVE METABOLIC PANEL WITH GFR
ALT: 8 U/L (ref 0–44)
AST: 15 U/L (ref 15–41)
Albumin: 3.9 g/dL (ref 3.5–5.0)
Alkaline Phosphatase: 83 U/L (ref 38–126)
Anion gap: 10 (ref 5–15)
BUN: 10 mg/dL (ref 8–23)
CO2: 25 mmol/L (ref 22–32)
Calcium: 9.5 mg/dL (ref 8.9–10.3)
Chloride: 104 mmol/L (ref 98–111)
Creatinine, Ser: 0.52 mg/dL (ref 0.44–1.00)
GFR, Estimated: >60 mL/min (ref >60)
Glucose, Bld: 88 mg/dL (ref 70–99)
Potassium: 3.7 mmol/L (ref 3.5–5.1)
Sodium: 139 mmol/L (ref 135–145)
Total Bilirubin: 0.3 mg/dL (ref 0.0–1.2)
Total Protein: 7.2 g/dL (ref 6.5–8.1)

## 2024-06-05 LAB — CBC WITH DIFFERENTIAL/PLATELET
Abs Immature Granulocytes: 0.03 K/uL (ref 0.00–0.07)
Basophils Absolute: 0.1 K/uL (ref 0.0–0.1)
Basophils Relative: 1 %
Eosinophils Absolute: 0.3 K/uL (ref 0.0–0.5)
Eosinophils Relative: 4 %
HCT: 35 % — ABNORMAL LOW (ref 36.0–46.0)
Hemoglobin: 11.3 g/dL — ABNORMAL LOW (ref 12.0–15.0)
Immature Granulocytes: 0 %
Lymphocytes Relative: 10 %
Lymphs Abs: 0.7 K/uL (ref 0.7–4.0)
MCH: 34.1 pg — ABNORMAL HIGH (ref 26.0–34.0)
MCHC: 32.3 g/dL (ref 30.0–36.0)
MCV: 105.7 fL — ABNORMAL HIGH (ref 80.0–100.0)
Monocytes Absolute: 1.2 K/uL — ABNORMAL HIGH (ref 0.1–1.0)
Monocytes Relative: 16 %
Neutro Abs: 5.2 K/uL (ref 1.7–7.7)
Neutrophils Relative %: 69 %
Platelets: 273 K/uL (ref 150–400)
RBC: 3.31 MIL/uL — ABNORMAL LOW (ref 3.87–5.11)
RDW: 14.1 % (ref 11.5–15.5)
WBC: 7.5 K/uL (ref 4.0–10.5)
nRBC: 0 % (ref 0.0–0.2)

## 2024-06-05 LAB — MAGNESIUM: Magnesium: 1.9 mg/dL (ref 1.7–2.4)

## 2024-06-05 MED ORDER — SODIUM CHLORIDE 0.9 % IV SOLN: INTRAVENOUS | Status: DC

## 2024-06-05 MED ORDER — SODIUM CHLORIDE 0.9 % IV SOLN
1500.0000 mg | Freq: Once | INTRAVENOUS | Status: AC
  Administered 2024-06-05: 1500 mg via INTRAVENOUS
  Filled 2024-06-05: qty 30

## 2024-06-05 NOTE — Progress Notes (Signed)
 Patient has been examined by Dr. Davonna. Vital signs and labs have been reviewed by MD - ANC, Creatinine, LFTs, hemoglobin, and platelets have been reviewed by M.D. - pt may proceed with treatment.  Primary RN and pharmacy notified.

## 2024-06-05 NOTE — Patient Instructions (Signed)
 [facility] [place] [facility]  Discharge Instructions: Thank you for choosing [facility] to provide your oncology and hematology care.  If you have a lab appointment with the Cancer Center - please note that after April 8th, 2024, all labs will be drawn in the cancer center.  You do not have to check in or register with the main entrance as you have in the past but will complete your check-in in the cancer center.  Wear comfortable clothing and clothing appropriate for easy access to any Portacath or PICC line.   We strive to give you quality time with your provider. You may need to reschedule your appointment if you arrive late (15 or more minutes).  Arriving late affects you and other patients whose appointments are after yours.  Also, if you miss three or more appointments without notifying the office, you may be dismissed from the clinic at the provider's discretion.      For prescription refill requests, have your pharmacy contact our office and allow 72 hours for refills to be completed.    Today you received the following chemotherapy and/or immunotherapy agents imfiniz   To help prevent nausea and vomiting after your treatment, we encourage you to take your nausea medication as directed.  BELOW ARE SYMPTOMS THAT SHOULD BE REPORTED IMMEDIATELY: *FEVER GREATER THAN 100.4 F (38 C) OR HIGHER *CHILLS OR SWEATING *NAUSEA AND VOMITING THAT IS NOT CONTROLLED WITH YOUR NAUSEA MEDICATION *UNUSUAL SHORTNESS OF BREATH *UNUSUAL BRUISING OR BLEEDING *URINARY PROBLEMS (pain or burning when urinating, or frequent urination) *BOWEL PROBLEMS (unusual diarrhea, constipation, pain near the anus) TENDERNESS IN MOUTH AND THROAT WITH OR WITHOUT PRESENCE OF ULCERS (sore throat, sores in mouth, or a toothache) UNUSUAL RASH, SWELLING OR PAIN  UNUSUAL VAGINAL DISCHARGE OR ITCHING   Items with * indicate a potential emergency and should be followed up as  soon as possible or go to the Emergency Department if any problems should occur.  Please show the CHEMOTHERAPY ALERT CARD or IMMUNOTHERAPY ALERT CARD at check-in to the Emergency Department and triage nurse.  Should you have questions after your visit or need to cancel or reschedule your appointment, please contact [facility] [place] - A DEPT OF [facility] [phone]  and follow the prompts.  Office hours are 8:00 a.m. to 4:30 p.m. Monday - Friday. Please note that voicemails left after 4:00 p.m. may not be returned until the following business day.  We are closed weekends and major holidays. You have access to a nurse at all times for urgent questions. Please call the main number to the clinic [phone] and follow the prompts.  For any non-urgent questions, you may also contact your provider using MyChart. We now offer e-Visits for anyone 38 and older to request care online for non-urgent symptoms. For details visit [email or web address].   Also download the MyChart app! Go to the app store, search "MyChart", open the app, select [place], and log in with your MyChart username and password.

## 2024-06-05 NOTE — Progress Notes (Signed)
 Patient Care Team: [name], FNP as PCP - General (Family Medicine) Darlean Ozell NOVAK, MD as Consulting Physician (Pulmonary Disease) Prentis Duwaine BROCKS, RN as Oncology Nurse Navigator  Clinic Day:  06/05/2024  Referring physician: [name], FNP   CHIEF COMPLAINT:  CC: Stage IIIA Non small cell lung carcinoma  ASSESSMENT & PLAN:   Assessment & Plan: Denise Rivas  is a 63 y.o. 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  Extensive oncology history as below S/p neoadjuvant chemotherapy with carboplatin , paclitaxel  and nivolumab  with progression Recently treated with chemo RT with cisplatin  and etoposide  at [facility] Restaging CT scan with good response to treatment   - Currently on consolidation durvalumab  since she has good response to chemo RT -Cycle 2-day 1 today.  Patient tolerating immunotherapy well.  Denies rash, diarrhea, fatigue, abdominal pain, shortness of breath. -Labs reviewed today: CMP: WNL, CBC: Hemoglobin: 11.3, WBC: 7.5 and platelets: 273 - Last TSH: 0.398, T4: 9.2 -Physical exam stable today.  Proceed with immunotherapy today. - Will obtain CT scan after 3 months of treatment. - Continue durvalumab  1500 mg every 4 weeks  Return to clinic in 8 weeks with labs and CT scan to discuss results and further management  Macrocytic anemia Likely secondary to previous chemotherapy  - Will obtain anemia panel with next blood draw.   The patient understands the plans discussed today and is in agreement with them.  She knows to contact our office if she develops concerns prior to her next appointment.  30 minutes of total time was spent for this patient encounter, including preparation,review of records,  face-to-face counseling with the patient and coordination of care, physical exam, and documentation of the encounter.    I, Marijo Sharps, acting as a Neurosurgeon for [employer], MD.,have documented all relevant documentation on the behalf of Mickiel Dry, MD,as directed by  Mickiel Dry, MD while in the presence of Mickiel Dry, MD.  I, Mickiel Dry MD, have reviewed the above documentation for accuracy and completeness, and I agree with the above.    Mickiel Dry, MD  [facility] [place] - A DEPT OF [facility] [address] [place] [address] Dept: [phone] Dept Fax: [phone]   Orders Placed This Encounter  Procedures   CT CHEST W CONTRAST    Standing Status:   Future    Expected Date:   07/30/2024    Expiration Date:   06/05/2025    If indicated for the ordered procedure, I authorize the administration of contrast media per Radiology protocol:   Yes    Does the patient have a contrast media/X-ray dye allergy?:   No    Preferred imaging location?:   [facility]   Ferritin    Standing Status:   Future    Expected Date:   07/03/2024    Expiration Date:   10/01/2024   Folate    Standing Status:   Future    Expected Date:   07/03/2024    Expiration Date:   10/01/2024   Vitamin B12    Standing Status:   Future    Expected Date:   07/03/2024    Expiration Date:   10/01/2024   Iron and TIBC    Standing Status:   Future    Expected  Date:   07/03/2024    Expiration Date:   10/01/2024     ONCOLOGY HISTORY:   I have reviewed her chart and materials related to her cancer extensively and collaborated history with the patient. 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 This mass involves the anterior pleural surface, with direct invasion and erosion of the  inner cortex of the left anterior third rib. -09/01/2023: PET: 7 cm anterior left upper lobe lung mass is markedly hypermetabolic and is invading the left chest wall with destruction of the left third anterior rib. No enlarged or hypermetabolic mediastinal or hilar lymph nodes. - 09/01/2023: MRI brain: No evidence of metastatic disease to the brain. -09/11/2023: Lung, left upper lobe mass, core biopsy:  -Invasive moderately differentiated squamous cell carcinoma.  -09/26/2023: [facility] NGS: PD-L1: Positive, TPS: 2%, TMB: High,11mut/Mb.  - BRAF, ALK, EGFR, KRAS, MET, RET, ROS1: Negative  - PIK3CA, TP53: Pathogenic variant present -10/12/2023: Port placed -10/31/2023-  01/02/2024:Carboplatin  + Paclitaxel  + nivolumab  (4 cycles) - 01/29/2024: CT chest: Continued interval increase in the size of large heterogeneous mass centered in the left upper lobe, currently measuring 7.6 x 10.1 cm. The mass is invading the left upper anterior chest wall with destruction of left second through fourth ribs anteromedially.No new lung mass or consolidation. No new or suspicious lung nodule. No mediastinal or hilar lymphadenopathy by size criteria. -01/30/2024: Evaluated by Dr.Hendrickson(thoracic surgery): Not a candidate for surgery and patient also refused surgery -02/20/2024- 04/01/2024: Chemo RT with Cisplatin  and etoposide   x 2 cycles. Last RT on 04/04/2024. (Done at [facility] for convenience for the patient) -04/17/2024: CT chest: Slight interval decrease in size of the large complex mass in the anterior left upper lobe which extends into the chest wall involving the anterior left 2nd-4th ribs and the pectoral muscles. No evidence of new or progressive disease in the chest. -05/09/2024: Consolidation Durvalumab  1500mg  every 4 weeks  Current Treatment:  Consolidation Durvalumab  1500mg  every 4 weeks  INTERVAL HISTORY:   Denise Rivas is here today for follow up for non small cell lung carcinoma.  She is  currently on durvalumab  consolidation therapy.  She reports no complaints today.  [name] states that she wants her port removed once it is no longer necessary.  She denies rash, difficulty breathing, diarrhea, and abdominal pain.  I have reviewed the past medical history, past surgical history, social history and family history with the patient and they are unchanged from previous note.  ALLERGIES:  has no known allergies.  MEDICATIONS:  Current Outpatient Medications  Medication Sig Dispense Refill   amLODipine (NORVASC) 5 MG tablet Take 5 mg by mouth in the morning.     aspirin EC 81 MG tablet Take 81 mg by mouth in the morning.     fluticasone (FLONASE) 50 MCG/ACT nasal spray Place 1 spray into both nostrils daily as needed for allergies.     Fosaprepitant  Dimeglumine (EMEND IV) Inject into the vein.     gabapentin  (NEURONTIN ) 300 MG capsule Take 1 capsule (300 mg total) by mouth See admin instructions. Take 1 capsule (300 mg) by mouth scheduled at bedtime, may take up to 2 additional dose if needed for pain. 90 capsule 11   hydrOXYzine (ATARAX) 25 MG tablet Take  25 mg by mouth daily as needed for anxiety.     ibuprofen (ADVIL) 200 MG tablet Take 400 mg by mouth every 8 (eight) hours as needed (pain).     Lactulose  20 GM/30ML SOLN Take 30 mLs (20 g total) by mouth daily as needed. Take 30 ml by mouth every 3 hours until you have a bowel movement, then use daily as needed for constipation 450 mL 1   lidocaine -prilocaine  (EMLA ) cream Apply 1 Application topically as needed. 30 g 0   lidocaine -prilocaine  (EMLA ) cream Apply to affected area once 30 g 3   magnesium  oxide (MAG-OX) 400 (240 Mg) MG tablet Take 1 tablet (400 mg total) by mouth daily. 30 tablet 1   megestrol  (MEGACE ) 40 MG/ML suspension Take 10 mLs (400 mg total) by mouth 2 (two) times daily. 480 mL 2   mirtazapine  (REMERON  SOL-TAB) 45 MG disintegrating tablet Take 1 tablet (45 mg total) by mouth at bedtime. 30 tablet 2   naproxen  sodium (ALEVE) 220 MG tablet Take 220-440 mg by mouth 2 (two) times daily as needed (pain.).     ondansetron  (ZOFRAN ) 4 MG tablet Take 1 tablet (4 mg total) by mouth every 8 (eight) hours as needed. 30 tablet 1   ondansetron  (ZOFRAN ) 8 MG tablet Take 1 tablet (8 mg total) by mouth every 8 (eight) hours as needed for nausea or vomiting. 30 tablet 1   oxyCODONE -acetaminophen  (PERCOCET) 10-325 MG tablet Take 1 tablet by mouth every 12 (twelve) hours as needed for pain. 60 tablet 0   Palonosetron  HCl (ALOXI  IV) Inject into the vein.     potassium chloride  SA (KLOR-CON  M) 20 MEQ tablet Take 1 tablet (20 mEq total) by mouth daily. 30 tablet 5   pravastatin (PRAVACHOL) 40 MG tablet Take 40 mg by mouth daily.     prochlorperazine  (COMPAZINE ) 10 MG tablet Take 1 tablet (10 mg total) by mouth every 6 (six) hours as needed for nausea or vomiting. 30 tablet 1   prochlorperazine  (COMPAZINE ) 10 MG tablet TAKE 1 TABLET BY MOUTH EVERY 6  HOURS AS NEEDED FOR NAUSEA AND  VOMITING 60 tablet 23   sertraline (ZOLOFT) 100 MG tablet Take 100 mg by mouth in the morning.     sucralfate  (CARAFATE ) 1 g tablet Take 1 tablet (1 g total) by mouth 4 (four) times daily -  with meals and at bedtime. Crush 1 tablet in 1 oz water and drink 5 min before meals for radiation induced esophagitis 120 tablet 2   No current facility-administered medications for this visit.    REVIEW OF SYSTEMS:   Constitutional: Denies fevers, chills or abnormal weight loss Eyes: Denies blurriness of vision Ears, nose, mouth, throat, and face: Denies mucositis or sore throat Respiratory: Denies cough, dyspnea or wheezes Cardiovascular: Denies palpitation, chest discomfort or lower extremity swelling Gastrointestinal:  Denies nausea, heartburn or change in bowel habits Skin: Denies abnormal skin rashes Lymphatics: Denies new lymphadenopathy or easy bruising Neurological:Denies numbness, tingling or new weaknesses Behavioral/Psych: Mood is stable, no  new changes  All other systems were reviewed with the patient and are negative.   VITALS:  Pulse 99.  Wt Readings from Last 3 Encounters:  06/05/24 124 lb 12.5 oz (56.6 kg)  05/09/24 121 lb 4.1 oz (55 kg)  05/01/24 123 lb 3.2 oz (55.9 kg)    There is no height or weight on file to calculate BMI.  Performance status (ECOG): 1 - Symptomatic but completely ambulatory  PHYSICAL EXAM:  GENERAL:alert, no distress and comfortable SKIN: skin color, texture, turgor are normal, no rashes or significant lesions, port in place LYMPH:  no palpable lymphadenopathy in the cervical, axillary or inguinal LUNGS: clear to auscultation and percussion with normal breathing effort HEART: regular rate & rhythm and no murmurs and no lower extremity edema ABDOMEN:abdomen soft, non-tender and normal bowel sounds Musculoskeletal:no cyanosis of digits and no clubbing  NEURO: alert & oriented x 3 with fluent speech, no focal motor/sensory deficits  LABORATORY DATA:  I have reviewed the data as listed  Lab Results  Component Value Date   WBC 7.5 06/05/2024   NEUTROABS 5.2 06/05/2024   HGB 11.3 (L) 06/05/2024   HCT 35.0 (L) 06/05/2024   MCV 105.7 (H) 06/05/2024   PLT 273 06/05/2024      Chemistry      Component Value Date/Time   NA 139 06/05/2024 0939   K 3.7 06/05/2024 0939   CL 104 06/05/2024 0939   CO2 25 06/05/2024 0939   BUN 10 06/05/2024 0939   CREATININE 0.52 06/05/2024 0939   CREATININE 0.56 04/24/2024 0833      Component Value Date/Time   CALCIUM 9.5 06/05/2024 0939   ALKPHOS 83 06/05/2024 0939   AST 15 06/05/2024 0939   AST 14 (L) 04/24/2024 0833   ALT 8 06/05/2024 0939   ALT 21 04/24/2024 0833   BILITOT 0.3 06/05/2024 0939   BILITOT 0.2 04/24/2024 0833      Latest Reference Range & Units 05/09/24 09:47  TSH 0.350 - 4.500 uIU/mL 0.398  Thyroxine (T4) 4.5 - 12.0 ug/dL 9.2      RADIOGRAPHIC STUDIES: I have personally reviewed the radiological images as listed and agreed  with the findings in the report.   CT Chest W Contrast CLINICAL DATA:  Non-small-cell lung cancer, staging. * Tracking Code: BO *  EXAM: CT CHEST WITH CONTRAST  TECHNIQUE: Multidetector CT imaging of the chest was performed during intravenous contrast administration.  RADIATION DOSE REDUCTION: This exam was performed according to the departmental dose-optimization program which includes automated exposure control, adjustment of the mA and/or kV according to patient size and/or use of iterative reconstruction technique.  CONTRAST:  75mL OMNIPAQUE  IOHEXOL  300 MG/ML  SOLN  COMPARISON:  Multiple priors including CT January 29, 2024  FINDINGS: Cardiovascular: Aortic atherosclerosis. Normal size heart. Trace pericardial effusion.  Mediastinum/Nodes: No suspicious thyroid  nodule. No pathologically enlarged mediastinal, hilar or axillary lymph nodes.  Lungs/Pleura: Large complex mass in the anterior left upper lobe which extends into the chest wall involving the anterior left 2nd-4th ribs and the pectoral muscles measures 9.7 x 6.9 cm on image 48/4 previously 10.1 x 7.6 cm.  No new suspicious pulmonary nodules or masses.  Emphysema. Multifocal atelectasis/scarring. Biapical pleuroparenchymal scarring. Mild diffuse bronchial wall thickening.  Upper Abdomen: Fluid density lesion in the right renal sinus is compatible with a cyst.  Musculoskeletal: Dextroconvex curvature of the thoracolumbar spine.  IMPRESSION: 1. Slight interval decrease in size of the large complex mass in the anterior left upper lobe which extends into the chest wall involving the anterior left 2nd-4th ribs and the pectoral muscles. 2. No evidence of new or progressive disease in the chest.  Aortic Atherosclerosis (ICD10-I70.0) and Emphysema (ICD10-J43.9).  Electronically Signed   By: Reyes Holder M.D.   On: 04/20/2024 08:05

## 2024-06-05 NOTE — Patient Instructions (Signed)

## 2024-06-05 NOTE — Progress Notes (Signed)
 Patient tolerated chemotherapy with no complaints voiced.  Side effects with management reviewed with understanding verbalized.  Port site clean and dry with no bruising or swelling noted at site.  Good blood return noted before and after administration of chemotherapy.  Band aid applied.  Patient left in satisfactory condition with VSS and no s/s of distress noted. All follow ups as scheduled.   Venkat Ankney [employer]

## 2024-06-06 ENCOUNTER — Other Ambulatory Visit: Payer: Self-pay

## 2024-06-18 ENCOUNTER — Other Ambulatory Visit: Payer: Self-pay

## 2024-06-28 ENCOUNTER — Encounter: Payer: Self-pay | Admitting: Oncology

## 2024-07-01 ENCOUNTER — Telehealth: Payer: Self-pay

## 2024-07-01 NOTE — Telephone Encounter (Signed)
 11/17 Patient called after missed call.  Patient has no appt with RadOnc dept, but at [facility] for her infusion appt on 11/19.

## 2024-07-02 ENCOUNTER — Other Ambulatory Visit (HOSPITAL_COMMUNITY): Payer: Self-pay | Admitting: Nurse Practitioner

## 2024-07-02 DIAGNOSIS — Z1231 Encounter for screening mammogram for malignant neoplasm of breast: Secondary | ICD-10-CM

## 2024-07-03 ENCOUNTER — Inpatient Hospital Stay: Attending: Radiation Oncology

## 2024-07-03 ENCOUNTER — Inpatient Hospital Stay

## 2024-07-03 VITALS — BP 110/69 | HR 99 | Temp 97.7°F | Resp 18

## 2024-07-03 DIAGNOSIS — Z5112 Encounter for antineoplastic immunotherapy: Secondary | ICD-10-CM | POA: Insufficient documentation

## 2024-07-03 DIAGNOSIS — Z79899 Other long term (current) drug therapy: Secondary | ICD-10-CM | POA: Insufficient documentation

## 2024-07-03 DIAGNOSIS — C3412 Malignant neoplasm of upper lobe, left bronchus or lung: Secondary | ICD-10-CM | POA: Insufficient documentation

## 2024-07-03 DIAGNOSIS — D649 Anemia, unspecified: Secondary | ICD-10-CM

## 2024-07-03 LAB — CBC WITH DIFFERENTIAL/PLATELET
Abs Immature Granulocytes: 0.03 K/uL (ref 0.00–0.07)
Basophils Absolute: 0 K/uL (ref 0.0–0.1)
Basophils Relative: 1 %
Eosinophils Absolute: 0.2 K/uL (ref 0.0–0.5)
Eosinophils Relative: 3 %
HCT: 37.4 % (ref 36.0–46.0)
Hemoglobin: 12 g/dL (ref 12.0–15.0)
Immature Granulocytes: 0 %
Lymphocytes Relative: 10 %
Lymphs Abs: 0.8 K/uL (ref 0.7–4.0)
MCH: 33.1 pg (ref 26.0–34.0)
MCHC: 32.1 g/dL (ref 30.0–36.0)
MCV: 103.3 fL — ABNORMAL HIGH (ref 80.0–100.0)
Monocytes Absolute: 1.1 K/uL — ABNORMAL HIGH (ref 0.1–1.0)
Monocytes Relative: 14 %
Neutro Abs: 5.4 K/uL (ref 1.7–7.7)
Neutrophils Relative %: 72 %
Platelets: 304 K/uL (ref 150–400)
RBC: 3.62 MIL/uL — ABNORMAL LOW (ref 3.87–5.11)
RDW: 12.9 % (ref 11.5–15.5)
WBC: 7.5 K/uL (ref 4.0–10.5)
nRBC: 0 % (ref 0.0–0.2)

## 2024-07-03 LAB — COMPREHENSIVE METABOLIC PANEL WITH GFR
ALT: 8 U/L (ref 0–44)
AST: 15 U/L (ref 15–41)
Albumin: 3.9 g/dL (ref 3.5–5.0)
Alkaline Phosphatase: 95 U/L (ref 38–126)
Anion gap: 11 (ref 5–15)
BUN: 15 mg/dL (ref 8–23)
CO2: 25 mmol/L (ref 22–32)
Calcium: 9.4 mg/dL (ref 8.9–10.3)
Chloride: 105 mmol/L (ref 98–111)
Creatinine, Ser: 0.73 mg/dL (ref 0.44–1.00)
GFR, Estimated: >60 mL/min (ref >60)
Glucose, Bld: 86 mg/dL (ref 70–99)
Potassium: 3.8 mmol/L (ref 3.5–5.1)
Sodium: 141 mmol/L (ref 135–145)
Total Bilirubin: 0.2 mg/dL (ref 0.0–1.2)
Total Protein: 7.2 g/dL (ref 6.5–8.1)

## 2024-07-03 LAB — IRON AND TIBC
Iron: 37 ug/dL (ref 28–170)
Saturation Ratios: 14 % (ref 10.4–31.8)
TIBC: 269 ug/dL (ref 250–450)
UIBC: 232 ug/dL

## 2024-07-03 LAB — VITAMIN B12: Vitamin B-12: 472 pg/mL (ref 180–914)

## 2024-07-03 LAB — TSH: TSH: 0.899 u[IU]/mL (ref 0.350–4.500)

## 2024-07-03 LAB — FOLATE: Folate: 14.6 ng/mL (ref >5.9)

## 2024-07-03 LAB — FERRITIN: Ferritin: 118 ng/mL (ref 11–307)

## 2024-07-03 LAB — MAGNESIUM: Magnesium: 1.8 mg/dL (ref 1.7–2.4)

## 2024-07-03 MED ORDER — SODIUM CHLORIDE 0.9 % IV SOLN
1500.0000 mg | Freq: Once | INTRAVENOUS | Status: AC
  Administered 2024-07-03: 1500 mg via INTRAVENOUS
  Filled 2024-07-03: qty 30

## 2024-07-03 MED ORDER — SODIUM CHLORIDE 0.9 % IV SOLN: INTRAVENOUS | Status: DC

## 2024-07-03 NOTE — Patient Instructions (Signed)
 [facility] [place] - A DEPT OF [facility]  Discharge Instructions: Thank you for choosing [facility] to provide your oncology and hematology care.  If you have a lab appointment with the Cancer Center - please note that after April 8th, 2024, all labs will be drawn in the cancer center.  You do not have to check in or register with the main entrance as you have in the past but will complete your check-in in the cancer center.  Wear comfortable clothing and clothing appropriate for easy access to any Portacath or PICC line.   We strive to give you quality time with your provider. You may need to reschedule your appointment if you arrive late (15 or more minutes).  Arriving late affects you and other patients whose appointments are after yours.  Also, if you miss three or more appointments without notifying the office, you may be dismissed from the clinic at the provider's discretion.      For prescription refill requests, have your pharmacy contact our office and allow 72 hours for refills to be completed.    Today you received the following chemotherapy and/or immunotherapy agents imfinzi        To help prevent nausea and vomiting after your treatment, we encourage you to take your nausea medication as directed.  Durvalumab  Injection What is this medication? DURVALUMAB  (dur VAL ue mab) treats some types of cancer. It works by helping your immune system slow or stop the spread of cancer cells. It is a monoclonal antibody. This medicine may be used for other purposes; ask your health care provider or pharmacist if you have questions. COMMON BRAND NAME(S): IMFINZI  What should I tell my care team before I take this medication? They need to know if you have any of these conditions: Allogeneic stem cell transplant (uses someone else's stem cells) Autoimmune diseases, such as Crohn disease, ulcerative colitis, lupus History of chest radiation Nervous system  problems, such as Guillain-Barre syndrome, myasthenia gravis Organ transplant An unusual or allergic reaction to durvalumab , other medications, foods, dyes, or preservatives Pregnant or trying to get pregnant Breast-feeding How should I use this medication? This medication is infused into a vein. It is given by your care team in a hospital or clinic setting. A special MedGuide will be given to you before each treatment. Be sure to read this information carefully each time. Talk to your care team about the use of this medication in children. Special care may be needed. Overdosage: If you think you have taken too much of this medicine contact a poison control center or emergency room at once. NOTE: This medicine is only for you. Do not share this medicine with others. What if I miss a dose? Keep appointments for follow-up doses. It is important not to miss your dose. Call your care team if you are unable to keep an appointment. What may interact with this medication? Interactions have not been studied. This list may not describe all possible interactions. Give your health care provider a list of all the medicines, herbs, non-prescription drugs, or dietary supplements you use. Also tell them if you smoke, drink alcohol, or use illegal drugs. Some items may interact with your medicine. What should I watch for while using this medication? Your condition will be monitored carefully while you are receiving this medication. You may need blood work while taking this medication. This medication may cause serious skin reactions. They can happen weeks to months after  starting the medication. Contact your care team right away if you notice fevers or flu-like symptoms with a rash. The rash may be red or purple and then turn into blisters or peeling of the skin. You may also notice a red rash with swelling of the face, lips, or lymph nodes in your neck or under your arms. Tell your care team right away if you  have any change in your eyesight. Talk to your care team if you may be pregnant. Serious birth defects can occur if you take this medication during pregnancy and for 3 months after the last dose. You will need a negative pregnancy test before starting this medication. Contraception is recommended while taking this medication and for 3 months after the last dose. Your care team can help you find the option that works for you. Do not breastfeed while taking this medication and for 3 months after the last dose. What side effects may I notice from receiving this medication? Side effects that you should report to your care team as soon as possible: Allergic reactions--skin rash, itching, hives, swelling of the face, lips, tongue, or throat Dry cough, shortness of breath or trouble breathing Eye pain, redness, irritation, or discharge with blurry or decreased vision Heart muscle inflammation--unusual weakness or fatigue, shortness of breath, chest pain, fast or irregular heartbeat, dizziness, swelling of the ankles, feet, or hands Hormone gland problems--headache, sensitivity to light, unusual weakness or fatigue, dizziness, fast or irregular heartbeat, increased sensitivity to cold or heat, excessive sweating, constipation, hair loss, increased thirst or amount of urine, tremors or shaking, irritability Infusion reactions--chest pain, shortness of breath or trouble breathing, feeling faint or lightheaded Kidney injury (glomerulonephritis)--decrease in the amount of urine, red or dark brown urine, foamy or bubbly urine, swelling of the ankles, hands, or feet Liver injury--right upper belly pain, loss of appetite, nausea, light-colored stool, dark yellow or brown urine, yellowing skin or eyes, unusual weakness or fatigue Pain, tingling, or numbness in the hands or feet, muscle weakness, change in vision, confusion or trouble speaking, loss of balance or coordination, trouble walking, seizures Rash, fever, and  swollen lymph nodes Redness, blistering, peeling, or loosening of the skin, including inside the mouth Sudden or severe stomach pain, bloody diarrhea, fever, nausea, vomiting Side effects that usually do not require medical attention (report these to your care team if they continue or are bothersome): Bone, joint, or muscle pain Diarrhea Fatigue Loss of appetite Nausea Skin rash This list may not describe all possible side effects. Call your doctor for medical advice about side effects. You may report side effects to FDA at 1-800-FDA-1088. Where should I keep my medication? This medication is given in a hospital or clinic. It will not be stored at home. NOTE: This sheet is a summary. It may not cover all possible information. If you have questions about this medicine, talk to your doctor, pharmacist, or health care provider.  2024 Elsevier/Gold Standard (2021-12-14 00:00:00)  BELOW ARE SYMPTOMS THAT SHOULD BE REPORTED IMMEDIATELY: *FEVER GREATER THAN 100.4 F (38 C) OR HIGHER *CHILLS OR SWEATING *NAUSEA AND VOMITING THAT IS NOT CONTROLLED WITH YOUR NAUSEA MEDICATION *UNUSUAL SHORTNESS OF BREATH *UNUSUAL BRUISING OR BLEEDING *URINARY PROBLEMS (pain or burning when urinating, or frequent urination) *BOWEL PROBLEMS (unusual diarrhea, constipation, pain near the anus) TENDERNESS IN MOUTH AND THROAT WITH OR WITHOUT PRESENCE OF ULCERS (sore throat, sores in mouth, or a toothache) UNUSUAL RASH, SWELLING OR PAIN  UNUSUAL VAGINAL DISCHARGE OR ITCHING   Items with *  indicate a potential emergency and should be followed up as soon as possible or go to the Emergency Department if any problems should occur.  Please show the CHEMOTHERAPY ALERT CARD or IMMUNOTHERAPY ALERT CARD at check-in to the Emergency Department and triage nurse.  Should you have questions after your visit or need to cancel or reschedule your appointment, please contact [facility] [place] - A DEPT OF [facility] [phone]  and follow the prompts.  Office hours are 8:00 a.m. to 4:30 p.m. Monday - Friday. Please note that voicemails left after 4:00 p.m. may not be returned until the following business day.  We are closed weekends and major holidays. You have access to a nurse at all times for urgent questions. Please call the main number to the clinic [phone] and follow the prompts.  For any non-urgent questions, you may also contact your provider using MyChart. We now offer e-Visits for anyone 29 and older to request care online for non-urgent symptoms. For details visit [email or web address].   Also download the MyChart app! Go to the app store, search MyChart, open the app, select [facility], and log in with your MyChart username and password.

## 2024-07-03 NOTE — Progress Notes (Signed)
 Patient presents today for chemotherapy infusion.  Patient is in satisfactory condition with no new complaints voiced.  Vital signs are stable.  Labs reviewed and all labs are within treatment parameters.  We will proceed with treatment per MD orders.    Treatment given today per MD orders. Tolerated infusion without adverse affects. Vital signs stable. No complaints at this time. Discharged from clinic ambulatory in stable condition. Alert and oriented x 3. F/U with [facility] as scheduled.

## 2024-07-04 ENCOUNTER — Other Ambulatory Visit: Payer: Self-pay

## 2024-07-04 LAB — T4: T4, Total: 6.9 ug/dL (ref 4.5–12.0)

## 2024-07-22 ENCOUNTER — Ambulatory Visit (HOSPITAL_COMMUNITY)
Admission: RE | Admit: 2024-07-22 | Discharge: 2024-07-22 | Disposition: A | Source: Ambulatory Visit | Attending: Oncology

## 2024-07-22 DIAGNOSIS — C3412 Malignant neoplasm of upper lobe, left bronchus or lung: Secondary | ICD-10-CM

## 2024-07-22 MED ORDER — IOHEXOL 300 MG/ML  SOLN
80.0000 mL | Freq: Once | INTRAMUSCULAR | Status: AC | PRN
  Administered 2024-07-22: 80 mL via INTRAVENOUS

## 2024-07-29 ENCOUNTER — Other Ambulatory Visit: Payer: Self-pay | Admitting: Oncology

## 2024-07-29 DIAGNOSIS — C3412 Malignant neoplasm of upper lobe, left bronchus or lung: Secondary | ICD-10-CM

## 2024-07-31 ENCOUNTER — Inpatient Hospital Stay: Attending: Radiation Oncology

## 2024-07-31 ENCOUNTER — Inpatient Hospital Stay: Attending: Radiation Oncology | Admitting: Oncology

## 2024-07-31 ENCOUNTER — Other Ambulatory Visit: Payer: Self-pay

## 2024-07-31 ENCOUNTER — Other Ambulatory Visit: Payer: Self-pay | Admitting: *Deleted

## 2024-07-31 ENCOUNTER — Inpatient Hospital Stay

## 2024-07-31 VITALS — BP 109/75 | HR 95 | Resp 19

## 2024-07-31 DIAGNOSIS — D649 Anemia, unspecified: Secondary | ICD-10-CM

## 2024-07-31 DIAGNOSIS — Z9221 Personal history of antineoplastic chemotherapy: Secondary | ICD-10-CM | POA: Diagnosis not present

## 2024-07-31 DIAGNOSIS — D539 Nutritional anemia, unspecified: Secondary | ICD-10-CM | POA: Diagnosis not present

## 2024-07-31 DIAGNOSIS — Z79899 Other long term (current) drug therapy: Secondary | ICD-10-CM | POA: Diagnosis not present

## 2024-07-31 DIAGNOSIS — C3412 Malignant neoplasm of upper lobe, left bronchus or lung: Secondary | ICD-10-CM

## 2024-07-31 DIAGNOSIS — Z5112 Encounter for antineoplastic immunotherapy: Secondary | ICD-10-CM | POA: Insufficient documentation

## 2024-07-31 LAB — CBC WITH DIFFERENTIAL/PLATELET
Abs Immature Granulocytes: 0.02 K/uL (ref 0.00–0.07)
Basophils Absolute: 0 K/uL (ref 0.0–0.1)
Basophils Relative: 0 %
Eosinophils Absolute: 0.1 K/uL (ref 0.0–0.5)
Eosinophils Relative: 1 %
HCT: 37.3 % (ref 36.0–46.0)
Hemoglobin: 12 g/dL (ref 12.0–15.0)
Immature Granulocytes: 0 %
Lymphocytes Relative: 8 %
Lymphs Abs: 0.6 K/uL — ABNORMAL LOW (ref 0.7–4.0)
MCH: 31.4 pg (ref 26.0–34.0)
MCHC: 32.2 g/dL (ref 30.0–36.0)
MCV: 97.6 fL (ref 80.0–100.0)
Monocytes Absolute: 1.1 K/uL — ABNORMAL HIGH (ref 0.1–1.0)
Monocytes Relative: 14 %
Neutro Abs: 5.8 K/uL (ref 1.7–7.7)
Neutrophils Relative %: 77 %
Platelets: 311 K/uL (ref 150–400)
RBC: 3.82 MIL/uL — ABNORMAL LOW (ref 3.87–5.11)
RDW: 12.8 % (ref 11.5–15.5)
WBC: 7.6 K/uL (ref 4.0–10.5)
nRBC: 0 % (ref 0.0–0.2)

## 2024-07-31 LAB — COMPREHENSIVE METABOLIC PANEL WITH GFR
ALT: 7 U/L (ref 0–44)
AST: 13 U/L — ABNORMAL LOW (ref 15–41)
Albumin: 3.8 g/dL (ref 3.5–5.0)
Alkaline Phosphatase: 96 U/L (ref 38–126)
Anion gap: 13 (ref 5–15)
BUN: 17 mg/dL (ref 8–23)
CO2: 22 mmol/L (ref 22–32)
Calcium: 9.7 mg/dL (ref 8.9–10.3)
Chloride: 107 mmol/L (ref 98–111)
Creatinine, Ser: 0.55 mg/dL (ref 0.44–1.00)
GFR, Estimated: >60 mL/min (ref >60)
Glucose, Bld: 106 mg/dL — ABNORMAL HIGH (ref 70–99)
Potassium: 3.8 mmol/L (ref 3.5–5.1)
Sodium: 142 mmol/L (ref 135–145)
Total Bilirubin: 0.3 mg/dL (ref 0.0–1.2)
Total Protein: 7.2 g/dL (ref 6.5–8.1)

## 2024-07-31 LAB — MAGNESIUM: Magnesium: 2 mg/dL (ref 1.7–2.4)

## 2024-07-31 MED ORDER — SODIUM CHLORIDE 0.9 % IV SOLN
1500.0000 mg | Freq: Once | INTRAVENOUS | Status: AC
  Administered 2024-07-31: 12:00:00 1500 mg via INTRAVENOUS
  Filled 2024-07-31: qty 30

## 2024-07-31 MED ORDER — SODIUM CHLORIDE 0.9 % IV SOLN: INTRAVENOUS | Status: DC

## 2024-07-31 MED ORDER — OXYCODONE-ACETAMINOPHEN 10-325 MG PO TABS: 1.0000 | ORAL_TABLET | Freq: Two times a day (BID) | ORAL | 0 refills | Status: DC | PRN

## 2024-07-31 NOTE — Progress Notes (Signed)
Treatment given per orders. Patient tolerated it well without problems. Vitals stable and discharged home from clinic ambulatory. Follow up as scheduled.  

## 2024-07-31 NOTE — Patient Instructions (Signed)
 [facility]  - A DEPT OF [facility]  Discharge Instructions: Thank you for choosing [facility] to provide your oncology and hematology care.  If you have a lab appointment with the Cancer Center - please note that after April 8th, 2024, all labs will be drawn in the cancer center.  You do not have to check in or register with the main entrance as you have in the past but will complete your check-in in the cancer center.  Wear comfortable clothing and clothing appropriate for easy access to any Portacath or PICC line.   We strive to give you quality time with your provider. You may need to reschedule your appointment if you arrive late (15 or more minutes).  Arriving late affects you and other patients whose appointments are after yours.  Also, if you miss three or more appointments without notifying the office, you may be dismissed from the clinic at the provider's discretion.      For prescription refill requests, have your pharmacy contact our office and allow 72 hours for refills to be completed.    Today you received the following chemotherapy and/or immunotherapy agents Durvalumab    To help prevent nausea and vomiting after your treatment, we encourage you to take your nausea medication as directed.  BELOW ARE SYMPTOMS THAT SHOULD BE REPORTED IMMEDIATELY: *FEVER GREATER THAN 100.4 F (38 C) OR HIGHER *CHILLS OR SWEATING *NAUSEA AND VOMITING THAT IS NOT CONTROLLED WITH YOUR NAUSEA MEDICATION *UNUSUAL SHORTNESS OF BREATH *UNUSUAL BRUISING OR BLEEDING *URINARY PROBLEMS (pain or burning when urinating, or frequent urination) *BOWEL PROBLEMS (unusual diarrhea, constipation, pain near the anus) TENDERNESS IN MOUTH AND THROAT WITH OR WITHOUT PRESENCE OF ULCERS (sore throat, sores in mouth, or a toothache) UNUSUAL RASH, SWELLING OR PAIN  UNUSUAL VAGINAL DISCHARGE OR ITCHING   Items with * indicate a potential emergency and should be followed up  as soon as possible or go to the Emergency Department if any problems should occur.  Please show the CHEMOTHERAPY ALERT CARD or IMMUNOTHERAPY ALERT CARD at check-in to the Emergency Department and triage nurse.  Should you have questions after your visit or need to cancel or reschedule your appointment, please contact [facility]  - A DEPT OF [facility] [phone]  and follow the prompts.  Office hours are 8:00 a.m. to 4:30 p.m. Monday - Friday. Please note that voicemails left after 4:00 p.m. may not be returned until the following business day.  We are closed weekends and major holidays. You have access to a nurse at all times for urgent questions. Please call the main number to the clinic [phone] and follow the prompts.  For any non-urgent questions, you may also contact your provider using MyChart. We now offer e-Visits for anyone 39 and older to request care online for non-urgent symptoms. For details visit [email or web address].   Also download the MyChart app! Go to the app store, search MyChart, open the app, select [place], and log in with your MyChart username and password.

## 2024-07-31 NOTE — Progress Notes (Signed)
 Patient Care Team: [name], FNP as PCP - General (Family Medicine) Darlean Ozell NOVAK, MD as Consulting Physician (Pulmonary Disease) Prentis Duwaine BROCKS, RN as Oncology Nurse Navigator  Clinic Day:  07/31/2024  Referring physician: [name], FNP   CHIEF COMPLAINT:  CC: Stage IIIA Non small cell lung carcinoma  ASSESSMENT & PLAN:   Assessment & Plan: Denise Rivas  is a 63 y.o. 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  Extensive oncology history as below S/p neoadjuvant chemotherapy with carboplatin , paclitaxel  and nivolumab  with progression Recently treated with chemo RT with cisplatin  and etoposide  at [facility] Restaging CT scan with good response to treatment   - Currently on consolidation durvalumab  since she has good response to chemo RT -Cycle 4-day 1 today.  Patient tolerating immunotherapy well.  Denies rash, diarrhea, fatigue, abdominal pain, shortness of breath. -Labs reviewed today: CMP: WNL, CBC: WNL - Last TSH: 0.899, T4: 6.9 -Physical exam stable today.  Proceed with immunotherapy today. - We reviewed the CT scan findings together.  There is slight interval decrease in size of the large partially necrotic and irregular enhancing left upper lobe lung mass.  Stable extensive chest wall invasion with destruction of the 2nd, 3rd and 4th anterior left ribs and also invasion of the pectoralis minor and major muscles.  No new metastatic disease.  Will repeat CT scan in 3 months. I.e 03/26 - Continue durvalumab  1500 mg every 4 weeks  Return to clinic in 8 weeks with labs for follow-up  Macrocytic anemia Resolved at this time   The patient understands the plans discussed today and is in agreement with them.  She knows to contact our office if she develops concerns prior to her next appointment.  The total time spent in  the appointment was 14 minutes for the encounter with patient, including review of chart and various tests results, discussions about plan of care and coordination of care plan     Mickiel Dry, MD  [facility] [place] - A DEPT OF [facility] [address] [place] [address] Dept: [phone] Dept Fax: [phone]   No orders of the defined types were placed in this encounter.    ONCOLOGY HISTORY:   I have reviewed her chart and materials related to her cancer extensively and collaborated history with the patient. 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 This mass involves the anterior pleural surface, with direct invasion and erosion of the inner cortex of the left anterior third rib. -09/01/2023: PET: 7 cm anterior left upper lobe lung mass is markedly hypermetabolic and is invading the left chest wall with destruction of the left third anterior rib. No enlarged or hypermetabolic mediastinal or hilar lymph nodes. - 09/01/2023: MRI brain: No evidence of metastatic disease to the brain. -09/11/2023: Lung, left upper lobe mass, core biopsy:  -Invasive moderately differentiated squamous cell carcinoma.  -09/26/2023: [facility] NGS: PD-L1: Positive, TPS: 2%, TMB: High,11mut/Mb.  - BRAF, ALK, EGFR, KRAS, MET, RET, ROS1: Negative  - PIK3CA, TP53: Pathogenic variant present -10/12/2023: Port placed -10/31/2023-  01/02/2024:Carboplatin  + Paclitaxel  + nivolumab  (4 cycles) - 01/29/2024: CT chest: Continued interval increase in the size of large heterogeneous mass centered in the left upper lobe, currently measuring 7.6 x 10.1 cm. The mass is invading the left upper anterior chest  wall with destruction of left second through fourth ribs anteromedially.No new lung mass or consolidation. No new or suspicious lung nodule. No mediastinal or hilar lymphadenopathy by size criteria. -01/30/2024: Evaluated by Dr.Hendrickson(thoracic surgery): Not a candidate for surgery and patient also refused surgery -02/20/2024- 04/01/2024: Chemo RT with Cisplatin  and etoposide   x 2 cycles. Last RT on 04/04/2024. (Done at [facility] for convenience for the patient) -04/17/2024: CT chest: Slight interval decrease in size of the large complex mass in the anterior left upper lobe which extends into the chest wall involving the anterior left 2nd-4th ribs and the pectoral muscles. No evidence of new or progressive disease in the chest. -05/09/2024: Consolidation Durvalumab  1500mg  every 4 weeks -07/22/2024: Slight interval decrease in size of the large partially necrotic and irregular enhancing left upper lobe lung mass. Stable extensive chest wall invasion with destruction of the second, third and fourth anterior left ribs and also invasion of the pectoralis minor and major muscles.No mediastinal or hilar mass or adenopathy. No findings for pulmonary metastatic disease.   Current Treatment:  Consolidation Durvalumab  1500mg  every 4 weeks  INTERVAL HISTORY:   Discussed the use of AI scribe software for clinical note transcription with the patient, who gave verbal consent to proceed.  History of Present Illness Denise Rivas is a 63 year old female with non-small cell lung cancer undergoing immunotherapy who presents for oncology follow-up.  She is currently receiving immunotherapy for non-small cell lung cancer, with recent imaging demonstrating a reduction in the size of her lung mass. She denies new or worsening symptoms related to her malignancy or treatment, including rash, diarrhea, shortness of breath, and abdominal pain. Laboratory studies, including hemoglobin, are within normal  limits.    I have reviewed the past medical history, past surgical history, social history and family history with the patient and they are unchanged from previous note.  ALLERGIES:  has no known allergies.  MEDICATIONS:  Current Outpatient Medications  Medication Sig Dispense Refill   amLODipine (NORVASC) 5 MG tablet Take 5 mg by mouth in the morning.     aspirin EC 81 MG tablet Take 81 mg by mouth in the morning.     fluticasone (FLONASE) 50 MCG/ACT nasal spray Place 1 spray into both nostrils daily as needed for allergies.     Fosaprepitant  Dimeglumine (EMEND IV) Inject into the vein.     gabapentin  (NEURONTIN ) 300 MG capsule Take 1 capsule (300 mg total) by mouth See admin instructions. Take 1 capsule (300 mg) by mouth scheduled at bedtime, may take up to 2 additional dose if needed for pain. 90 capsule 11   hydrOXYzine (ATARAX) 25 MG tablet Take 25 mg by mouth daily as needed for anxiety.     ibuprofen (ADVIL) 200 MG tablet Take 400 mg by mouth every 8 (eight) hours as needed (pain).     Lactulose  20 GM/30ML SOLN Take 30 mLs (20 g total) by mouth daily as needed. Take 30 ml by mouth every 3 hours until you have a bowel movement, then use daily as needed for constipation 450 mL 1   lidocaine -prilocaine  (EMLA ) cream Apply 1 Application topically as needed. 30 g 0   lidocaine -prilocaine  (EMLA ) cream Apply to affected area once 30 g 3  magnesium  oxide (MAG-OX) 400 (240 Mg) MG tablet Take 1 tablet (400 mg total) by mouth daily. 30 tablet 1   megestrol  (MEGACE ) 40 MG/ML suspension Take 10 mLs (400 mg total) by mouth 2 (two) times daily. 480 mL 2   mirtazapine  (REMERON ) 15 MG tablet Take 15 mg by mouth at bedtime.     naproxen sodium (ALEVE) 220 MG tablet Take 220-440 mg by mouth 2 (two) times daily as needed (pain.).     ondansetron  (ZOFRAN ) 4 MG tablet Take 1 tablet (4 mg total) by mouth every 8 (eight) hours as needed. 30 tablet 1   ondansetron  (ZOFRAN ) 8 MG tablet Take 1 tablet (8 mg  total) by mouth every 8 (eight) hours as needed for nausea or vomiting. 30 tablet 1   Palonosetron  HCl (ALOXI  IV) Inject into the vein.     potassium chloride  SA (KLOR-CON  M) 20 MEQ tablet Take 1 tablet (20 mEq total) by mouth daily. 30 tablet 5   pravastatin (PRAVACHOL) 40 MG tablet Take 40 mg by mouth daily.     prochlorperazine  (COMPAZINE ) 10 MG tablet Take 1 tablet (10 mg total) by mouth every 6 (six) hours as needed for nausea or vomiting. 30 tablet 1   prochlorperazine  (COMPAZINE ) 10 MG tablet TAKE 1 TABLET BY MOUTH EVERY 6  HOURS AS NEEDED FOR NAUSEA AND  VOMITING 60 tablet 23   sertraline (ZOLOFT) 100 MG tablet Take 100 mg by mouth in the morning.     sucralfate  (CARAFATE ) 1 g tablet Take 1 tablet (1 g total) by mouth 4 (four) times daily -  with meals and at bedtime. Crush 1 tablet in 1 oz water and drink 5 min before meals for radiation induced esophagitis 120 tablet 2   oxyCODONE -acetaminophen  (PERCOCET) 10-325 MG tablet Take 1 tablet by mouth every 12 (twelve) hours as needed for pain. 60 tablet 0   No current facility-administered medications for this visit.   VITALS:  There were no vitals taken for this visit.  Wt Readings from Last 3 Encounters:  07/31/24 125 lb (56.7 kg)  07/03/24 125 lb 8 oz (56.9 kg)  06/05/24 124 lb 12.5 oz (56.6 kg)    There is no height or weight on file to calculate BMI.  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 LYMPH:  no palpable lymphadenopathy in the cervical, axillary or inguinal LUNGS: clear to auscultation and percussion with normal breathing effort HEART: regular rate & rhythm and no murmurs and no lower extremity edema ABDOMEN:abdomen soft, non-tender and normal bowel sounds Musculoskeletal:no cyanosis of digits and no clubbing  NEURO: alert & oriented x 3 with fluent speech  LABORATORY DATA:   I have reviewed the data as listed  Lab Results  Component Value Date   WBC 7.6 07/31/2024   NEUTROABS 5.8 07/31/2024   HGB 12.0 07/31/2024   HCT 37.3 07/31/2024   MCV 97.6 07/31/2024   PLT 311 07/31/2024      Chemistry      Component Value Date/Time   NA 142 07/31/2024 0939   K 3.8 07/31/2024 0939   CL 107 07/31/2024 0939   CO2 22 07/31/2024 0939   BUN 17 07/31/2024 0939   CREATININE 0.55 07/31/2024 0939   CREATININE 0.56 04/24/2024 0833      Component Value Date/Time   CALCIUM 9.7 07/31/2024 0939   ALKPHOS 96 07/31/2024 0939   AST 13 (  L) 07/31/2024 0939   AST 14 (L) 04/24/2024 0833   ALT 7 07/31/2024 0939   ALT 21 04/24/2024 0833   BILITOT 0.3 07/31/2024 0939   BILITOT 0.2 04/24/2024 0833      Latest Reference Range & Units 07/03/24 09:23  TSH 0.350 - 4.500 uIU/mL 0.899  Thyroxine (T4) 4.5 - 12.0 ug/dL 6.9      RADIOGRAPHIC STUDIES: I have personally reviewed the radiological images as listed and agreed with the findings in the report.   CT CHEST W CONTRAST CLINICAL DATA:  Restaging non-small cell lung cancer. * Tracking Code: BO *  EXAM: CT CHEST WITH CONTRAST  TECHNIQUE: Multidetector CT imaging of the chest was performed during intravenous contrast administration.  RADIATION DOSE REDUCTION: This exam was performed according to the departmental dose-optimization program which includes automated exposure control, adjustment of the mA and/or kV according to patient size and/or use of iterative reconstruction technique.  CONTRAST:  80mL OMNIPAQUE  IOHEXOL  300 MG/ML  SOLN  COMPARISON:  Left multiple prior imaging studies. The most recent CT scan is 04/17/2024. Most recent PET-CT 09/01/2023.  FINDINGS: Cardiovascular: The heart is normal in size. No pericardial effusion. Small amount of stable pericardial fluid. The aorta is normal in caliber. Stable age advanced atherosclerotic calcifications. No dissection no definite coronary  artery calcifications.  Mediastinum/Nodes: No mediastinal or hilar mass or lymphadenopathy. Small scattered lymph nodes are stable. The esophagus is unremarkable. The thyroid  gland is normal.  Lungs/Pleura: Large partially necrotic and irregular enhancing left upper lobe lung mass measures 9.2 x 6.6 cm and previously measured 9.6 x 7.2 cm. Extensive chest wall invasion is again demonstrated with destruction of the second, third and fourth anterior left ribs and also invasion of the pectoralis minor and major muscles.  Stable underlying emphysematous changes and pulmonary scarring. No new pulmonary nodules to suggest pulmonary metastatic disease. No pleural effusions or pleural lesions.  Upper Abdomen: No significant upper abdominal findings. No hepatic or adrenal gland lesions to suggest metastasis. No upper abdominal adenopathy. Stable age advanced vascular calcifications. Status post cholecystectomy.  Musculoskeletal: No breast masses, supraclavicular or axillary adenopathy. The bony thorax is intact. No lytic or sclerotic bone lesions to suggest metastatic disease. Stable compression deformity of T4. Stable destructive bony changes involving the left second, third and fourth ribs as detailed above.  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  IMPRESSION: 1. Slight interval decrease in size of the large partially necrotic and irregular enhancing left upper lobe lung mass. 2. Stable extensive chest wall invasion with destruction of the second, third and fourth anterior left ribs and also invasion of the pectoralis minor and major muscles. 3. No mediastinal or hilar mass or adenopathy. 4. No findings for pulmonary metastatic disease. 5. Stable emphysematous changes and pulmonary scarring. 6. No findings for upper abdominal metastatic disease. 7. 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  Aortic Atherosclerosis  (ICD10-I70.0) and Emphysema (ICD10-J43.9).  Electronically Signed   By: MYRTIS Stammer M.D.   On: 07/25/2024 10:09

## 2024-07-31 NOTE — Patient Instructions (Addendum)
 [facility] at [facility] Discharge Instructions   You were seen and examined today by Dr. Davonna.  She reviewed the results of your lab work which are normal/stable.   She reviewed the results of your CT scan which is showing the cancer has improved.   We will proceed with your treatment today.   Return as scheduled.    Thank you for choosing [facility] at [facility] to provide your oncology and hematology care.  To afford each patient quality time with our provider, please arrive at least 15 minutes before your scheduled appointment time.   If you have a lab appointment with the Cancer Center please come in thru the Main Entrance and check in at the main information desk.  You need to re-schedule your appointment should you arrive 10 or more minutes late.  We strive to give you quality time with our providers, and arriving late affects you and other patients whose appointments are after yours.  Also, if you no show three or more times for appointments you may be dismissed from the clinic at the providers discretion.     Again, thank you for choosing [facility].  Our hope is that these requests will decrease the amount of time that you wait before being seen by our physicians.       _____________________________________________________________  Should you have questions after your visit to [facility], please contact our office at [phone] and follow the prompts.  Our office hours are 8:00 a.m. and 4:30 p.m. Monday - Friday.  Please note that voicemails left after 4:00 p.m. may not be returned until the following business day.  We are closed weekends and major holidays.  You do have access to a nurse 24-7, just call the main number to the clinic [phone] and do not press any options, hold on the line and a nurse will answer the phone.    For prescription refill requests, have your pharmacy contact our  office and allow 72 hours.    Due to Covid, you will need to wear a mask upon entering the hospital. If you do not have a mask, a mask will be given to you at the Main Entrance upon arrival. For doctor visits, patients may have 1 support person age 26 or older with them. For treatment visits, patients can not have anyone with them due to social distancing guidelines and our immunocompromised population.

## 2024-07-31 NOTE — Progress Notes (Signed)
 Patient has been examined by Dr. Davonna. Vital signs and labs have been reviewed by MD - ANC, Creatinine, LFTs, hemoglobin, and platelets have been reviewed by M.D. - pt may proceed with treatment.  Primary RN and pharmacy notified.

## 2024-08-02 ENCOUNTER — Other Ambulatory Visit: Payer: Self-pay

## 2024-08-02 ENCOUNTER — Ambulatory Visit (HOSPITAL_COMMUNITY)
Admission: RE | Admit: 2024-08-02 | Discharge: 2024-08-02 | Disposition: A | Discharge status: Home / Self Care | Source: Ambulatory Visit | Attending: Nurse Practitioner | Admitting: Nurse Practitioner

## 2024-08-02 DIAGNOSIS — Z1231 Encounter for screening mammogram for malignant neoplasm of breast: Secondary | ICD-10-CM | POA: Insufficient documentation

## 2024-08-28 ENCOUNTER — Inpatient Hospital Stay

## 2024-08-28 ENCOUNTER — Inpatient Hospital Stay: Attending: Radiation Oncology

## 2024-08-28 VITALS — BP 104/73 | HR 103 | Temp 97.5°F | Resp 18

## 2024-08-28 DIAGNOSIS — Z5112 Encounter for antineoplastic immunotherapy: Secondary | ICD-10-CM | POA: Diagnosis present

## 2024-08-28 DIAGNOSIS — C3412 Malignant neoplasm of upper lobe, left bronchus or lung: Secondary | ICD-10-CM | POA: Insufficient documentation

## 2024-08-28 DIAGNOSIS — Z79899 Other long term (current) drug therapy: Secondary | ICD-10-CM | POA: Diagnosis not present

## 2024-08-28 LAB — COMPREHENSIVE METABOLIC PANEL WITH GFR
ALT: 7 U/L (ref 0–44)
AST: 12 U/L — ABNORMAL LOW (ref 15–41)
Albumin: 3.6 g/dL (ref 3.5–5.0)
Alkaline Phosphatase: 91 U/L (ref 38–126)
Anion gap: 13 (ref 5–15)
BUN: 13 mg/dL (ref 8–23)
CO2: 22 mmol/L (ref 22–32)
Calcium: 9.4 mg/dL (ref 8.9–10.3)
Chloride: 106 mmol/L (ref 98–111)
Creatinine, Ser: 0.56 mg/dL (ref 0.44–1.00)
GFR, Estimated: >60 mL/min
Glucose, Bld: 89 mg/dL (ref 70–99)
Potassium: 3.7 mmol/L (ref 3.5–5.1)
Sodium: 140 mmol/L (ref 135–145)
Total Bilirubin: 0.4 mg/dL (ref 0.0–1.2)
Total Protein: 7.2 g/dL (ref 6.5–8.1)

## 2024-08-28 LAB — CBC WITH DIFFERENTIAL/PLATELET
Abs Immature Granulocytes: 0.02 K/uL (ref 0.00–0.07)
Basophils Absolute: 0 K/uL (ref 0.0–0.1)
Basophils Relative: 1 %
Eosinophils Absolute: 0.1 K/uL (ref 0.0–0.5)
Eosinophils Relative: 1 %
HCT: 36.6 % (ref 36.0–46.0)
Hemoglobin: 11.8 g/dL — ABNORMAL LOW (ref 12.0–15.0)
Immature Granulocytes: 0 %
Lymphocytes Relative: 9 %
Lymphs Abs: 0.8 K/uL (ref 0.7–4.0)
MCH: 30.4 pg (ref 26.0–34.0)
MCHC: 32.2 g/dL (ref 30.0–36.0)
MCV: 94.3 fL (ref 80.0–100.0)
Monocytes Absolute: 1.2 K/uL — ABNORMAL HIGH (ref 0.1–1.0)
Monocytes Relative: 14 %
Neutro Abs: 6.5 K/uL (ref 1.7–7.7)
Neutrophils Relative %: 75 %
Platelets: 308 K/uL (ref 150–400)
RBC: 3.88 MIL/uL (ref 3.87–5.11)
RDW: 13.6 % (ref 11.5–15.5)
WBC: 8.6 K/uL (ref 4.0–10.5)
nRBC: 0 % (ref 0.0–0.2)

## 2024-08-28 LAB — MAGNESIUM: Magnesium: 1.8 mg/dL (ref 1.7–2.4)

## 2024-08-28 MED ORDER — SODIUM CHLORIDE 0.9 % IV SOLN
1500.0000 mg | Freq: Once | INTRAVENOUS | Status: AC
  Administered 2024-08-28: 1500 mg via INTRAVENOUS
  Filled 2024-08-28: qty 30

## 2024-08-28 MED ORDER — SODIUM CHLORIDE 0.9 % IV SOLN: INTRAVENOUS | Status: DC

## 2024-08-28 NOTE — Patient Instructions (Signed)
 [facility] [place] - [facility]  Discharge Instructions: Thank you for choosing [facility] to provide your oncology and hematology care.  If you have a lab appointment with the Cancer Center - please note that after April 8th, 2024, all labs will be drawn in the cancer center.  You do not have to check in or register with the main entrance as you have in the past but will complete your check-in in the cancer center.  Wear comfortable clothing and clothing appropriate for easy access to any Portacath or PICC line.   We strive to give you quality time with your provider. You may need to reschedule your appointment if you arrive late (15 or more minutes).  Arriving late affects you and other patients whose appointments are after yours.  Also, if you miss three or more appointments without notifying the office, you may be dismissed from the clinic at the provider's discretion.      For prescription refill requests, have your pharmacy contact our office and allow 72 hours for refills to be completed.    Today you received the following chemotherapy and/or immunotherapy agents Imfinzi      To help prevent nausea and vomiting after your treatment, we encourage you to take your nausea medication as directed.  BELOW ARE SYMPTOMS THAT SHOULD BE REPORTED IMMEDIATELY: *FEVER GREATER THAN 100.4 F (38 C) OR HIGHER *CHILLS OR SWEATING *NAUSEA AND VOMITING THAT IS NOT CONTROLLED WITH YOUR NAUSEA MEDICATION *UNUSUAL SHORTNESS OF BREATH *UNUSUAL BRUISING OR BLEEDING *URINARY PROBLEMS (pain or burning when urinating, or frequent urination) *BOWEL PROBLEMS (unusual diarrhea, constipation, pain near the anus) TENDERNESS IN MOUTH AND THROAT WITH OR WITHOUT PRESENCE OF ULCERS (sore throat, sores in mouth, or a toothache) UNUSUAL RASH, SWELLING OR PAIN  UNUSUAL VAGINAL DISCHARGE OR ITCHING   Items with * indicate a potential emergency and should be followed up  as soon as possible or go to the Emergency Department if any problems should occur.  Please show the CHEMOTHERAPY ALERT CARD or IMMUNOTHERAPY ALERT CARD at check-in to the Emergency Department and triage nurse.  Should you have questions after your visit or need to cancel or reschedule your appointment, please contact [facility]CER CTR [place] - A DEPT OF [facility] [phone]  and follow the prompts.  Office hours are 8:00 a.m. to 4:30 p.m. Monday - Friday. Please note that voicemails left after 4:00 p.m. may not be returned until the following business day.  We are closed weekends and major holidays. You have access to a nurse at all times for urgent questions. Please call the main number to the clinic [phone] and follow the prompts.  For any non-urgent questions, you may also contact your provider using MyChart. We now offer e-Visits for anyone 42 and older to request care online for non-urgent symptoms. For details visit [email or web address].   Also download the MyChart app! Go to the app store, search "MyChart", open the app, select [place], and log in with your MyChart username and password.

## 2024-08-28 NOTE — Progress Notes (Signed)
 [place] patient okay for treatment with HR of 104.  Treatment given today per MD orders.  Tolerated infusion without adverse affects.  Vital signs stable.  No complaints at this time.  Discharge from clinic ambulatory in stable condition.  Alert and oriented X 3.  Follow up with [facility] as scheduled.

## 2024-08-29 ENCOUNTER — Inpatient Hospital Stay

## 2024-08-29 ENCOUNTER — Inpatient Hospital Stay: Admitting: Oncology

## 2024-08-31 ENCOUNTER — Other Ambulatory Visit: Payer: Self-pay

## 2024-09-01 ENCOUNTER — Other Ambulatory Visit: Payer: Self-pay

## 2024-09-01 ENCOUNTER — Encounter (HOSPITAL_COMMUNITY): Payer: Self-pay | Admitting: Emergency Medicine

## 2024-09-01 ENCOUNTER — Emergency Department (HOSPITAL_COMMUNITY)

## 2024-09-01 ENCOUNTER — Emergency Department (HOSPITAL_COMMUNITY): Admission: EM | Admit: 2024-09-01 | Discharge: 2024-09-01 | Disposition: A | Discharge status: Home / Self Care

## 2024-09-01 DIAGNOSIS — R0789 Other chest pain: Secondary | ICD-10-CM | POA: Insufficient documentation

## 2024-09-01 DIAGNOSIS — Z7982 Long term (current) use of aspirin: Secondary | ICD-10-CM | POA: Insufficient documentation

## 2024-09-01 DIAGNOSIS — M25512 Pain in left shoulder: Secondary | ICD-10-CM | POA: Diagnosis not present

## 2024-09-01 DIAGNOSIS — R079 Chest pain, unspecified: Secondary | ICD-10-CM | POA: Diagnosis present

## 2024-09-01 LAB — CBC WITH DIFFERENTIAL/PLATELET
Abs Immature Granulocytes: 0.04 K/uL (ref 0.00–0.07)
Basophils Absolute: 0 K/uL (ref 0.0–0.1)
Basophils Relative: 0 %
Eosinophils Absolute: 0.1 K/uL (ref 0.0–0.5)
Eosinophils Relative: 1 %
HCT: 35.2 % — ABNORMAL LOW (ref 36.0–46.0)
Hemoglobin: 11.2 g/dL — ABNORMAL LOW (ref 12.0–15.0)
Immature Granulocytes: 0 %
Lymphocytes Relative: 8 %
Lymphs Abs: 0.7 K/uL (ref 0.7–4.0)
MCH: 29.8 pg (ref 26.0–34.0)
MCHC: 31.8 g/dL (ref 30.0–36.0)
MCV: 93.6 fL (ref 80.0–100.0)
Monocytes Absolute: 1.4 K/uL — ABNORMAL HIGH (ref 0.1–1.0)
Monocytes Relative: 15 %
Neutro Abs: 6.9 K/uL (ref 1.7–7.7)
Neutrophils Relative %: 76 %
Platelets: 320 K/uL (ref 150–400)
RBC: 3.76 MIL/uL — ABNORMAL LOW (ref 3.87–5.11)
RDW: 13.9 % (ref 11.5–15.5)
WBC: 9.2 K/uL (ref 4.0–10.5)
nRBC: 0 % (ref 0.0–0.2)

## 2024-09-01 LAB — TROPONIN T, HIGH SENSITIVITY: Troponin T High Sensitivity: <15 ng/L (ref 0–19)

## 2024-09-01 LAB — BASIC METABOLIC PANEL WITH GFR
Anion gap: 12 (ref 5–15)
BUN: 12 mg/dL (ref 8–23)
CO2: 25 mmol/L (ref 22–32)
Calcium: 9.3 mg/dL (ref 8.9–10.3)
Chloride: 104 mmol/L (ref 98–111)
Creatinine, Ser: 0.5 mg/dL (ref 0.44–1.00)
GFR, Estimated: >60 mL/min
Glucose, Bld: 87 mg/dL (ref 70–99)
Potassium: 3.6 mmol/L (ref 3.5–5.1)
Sodium: 141 mmol/L (ref 135–145)

## 2024-09-01 MED ORDER — METHOCARBAMOL 500 MG PO TABS
1000.0000 mg | ORAL_TABLET | Freq: Once | ORAL | Status: AC
  Administered 2024-09-01: 1000 mg via ORAL
  Filled 2024-09-01: qty 2

## 2024-09-01 MED ORDER — HYDROCODONE-ACETAMINOPHEN 5-325 MG PO TABS
1.0000 | ORAL_TABLET | Freq: Once | ORAL | Status: AC
  Administered 2024-09-01: 1 via ORAL
  Filled 2024-09-01: qty 1

## 2024-09-01 MED ORDER — METHOCARBAMOL 500 MG PO TABS: 1000.0000 mg | ORAL_TABLET | Freq: Four times a day (QID) | ORAL | 0 refills | Status: AC | PRN

## 2024-09-01 MED ORDER — HEPARIN SOD (PORK) LOCK FLUSH 100 UNIT/ML IV SOLN
500.0000 [IU] | Freq: Once | INTRAVENOUS | Status: AC
  Administered 2024-09-01: 500 [IU]
  Filled 2024-09-01: qty 5

## 2024-09-01 NOTE — ED Provider Notes (Signed)
 " [place] [facility]Y DEPARTMENT AT [facility] Provider Note   CSN: [id number] Arrival date & time: 09/01/24  9286     Patient presents with: Chest Pain   Denise Rivas is a 64 y.o. female.   64 year old female presents for evaluation of chest pain.  She states it started Friday.  States that hurts when she touches her left chest or moves her left arm.  Denies any falls or injuries.  She states she is taking aspirin at home with minimal relief in her pain.  States it is constant.  Denies any lightheadedness, shortness of breath, nausea, or any other symptoms or concerns at this time.   Chest Pain Associated symptoms: no abdominal pain, no back pain, no cough, no fever, no palpitations, no shortness of breath and no vomiting        Prior to Admission medications  Medication Sig Start Date End Date Taking? Authorizing Provider  methocarbamol  (ROBAXIN ) 500 MG tablet Take 2 tablets (1,000 mg total) by mouth every 6 (six) hours as needed for up to 7 days. 09/01/24 09/08/24 Yes Zema Lizardo L, DO  amLODipine (NORVASC) 5 MG tablet Take 5 mg by mouth in the morning.    [provider]  aspirin EC 81 MG tablet Take 81 mg by mouth in the morning.    [provider]  fluticasone (FLONASE) 50 MCG/ACT nasal spray Place 1 spray into both nostrils daily as needed for allergies.    [provider]  Fosaprepitant  Dimeglumine (EMEND IV) Inject into the vein.    [provider]  gabapentin  (NEURONTIN ) 300 MG capsule Take 1 capsule (300 mg total) by mouth See admin instructions. Take 1 capsule (300 mg) by mouth scheduled at bedtime, may take up to 2 additional dose if needed for pain. 11/15/23   Darlean Ozell NOVAK, MD  hydrOXYzine (ATARAX) 25 MG tablet Take 25 mg by mouth daily as needed for anxiety. 03/31/23   [provider]  ibuprofen (ADVIL) 200 MG tablet Take 400 mg by mouth every 8 (eight) hours as needed (pain).    [provider]   Lactulose  20 GM/30ML SOLN Take 30 mLs (20 g total) by mouth daily as needed. Take 30 ml by mouth every 3 hours until you have a bowel movement, then use daily as needed for constipation 10/30/23   Rogers Hai, MD  lidocaine -prilocaine  (EMLA ) cream Apply 1 Application topically as needed. 10/30/23   Rogers Hai, MD  lidocaine -prilocaine  (EMLA ) cream Apply to affected area once 05/09/24   Kandala, Hyndavi, MD  magnesium  oxide (MAG-OX) 400 (240 Mg) MG tablet Take 1 tablet (400 mg total) by mouth daily. 04/24/24   Heilingoetter, Cassandra L, PA-C  megestrol  (MEGACE ) 40 MG/ML suspension Take 10 mLs (400 mg total) by mouth 2 (two) times daily. 02/05/24   Rogers Hai, MD  mirtazapine  (REMERON ) 15 MG tablet Take 15 mg by mouth at bedtime. 07/12/24   [provider]  naproxen sodium (ALEVE) 220 MG tablet Take 220-440 mg by mouth 2 (two) times daily as needed (pain.).    [provider]  ondansetron  (ZOFRAN ) 4 MG tablet Take 1 tablet (4 mg total) by mouth every 8 (eight) hours as needed. 10/12/23 10/11/24  Kallie Manuelita BROCKS, MD  ondansetron  (ZOFRAN ) 8 MG tablet Take 1 tablet (8 mg total) by mouth every 8 (eight) hours as needed for nausea or vomiting. 05/09/24   Davonna Siad, MD  oxyCODONE -acetaminophen  (PERCOCET) 10-325 MG tablet Take 1 tablet by mouth every  12 (twelve) hours as needed for pain. 07/31/24   Davonna Siad, MD  Palonosetron  HCl (ALOXI  IV) Inject into the vein.    [provider]  potassium chloride  SA (KLOR-CON  M) 20 MEQ tablet Take 1 tablet (20 mEq total) by mouth daily. 11/07/23   Katragadda, Sreedhar, MD  pravastatin (PRAVACHOL) 40 MG tablet Take 40 mg by mouth daily.    [provider]  prochlorperazine  (COMPAZINE ) 10 MG tablet Take 1 tablet (10 mg total) by mouth every 6 (six) hours as needed for nausea or vomiting. 05/09/24   Davonna Siad, MD  prochlorperazine  (COMPAZINE ) 10 MG tablet TAKE 1 TABLET BY MOUTH EVERY 6  HOURS AS  NEEDED FOR NAUSEA AND  VOMITING 05/27/24   Kandala, Hyndavi, MD  sertraline (ZOLOFT) 100 MG tablet Take 100 mg by mouth in the morning. 08/18/23   [provider]  sucralfate  (CARAFATE ) 1 g tablet Take 1 tablet (1 g total) by mouth 4 (four) times daily -  with meals and at bedtime. 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    Review of Systems  Constitutional:  Negative for chills and fever.  HENT:  Negative for ear pain and sore throat.   Eyes:  Negative for pain and visual disturbance.  Respiratory:  Negative for cough and shortness of breath.   Cardiovascular:  Positive for chest pain. Negative for palpitations.  Gastrointestinal:  Negative for abdominal pain and vomiting.  Genitourinary:  Negative for dysuria and hematuria.  Musculoskeletal:  Negative for arthralgias and back pain.  Skin:  Negative for color change and rash.  Neurological:  Negative for seizures and syncope.  All other systems reviewed and are negative.   Updated Vital Signs BP 107/74   Pulse 88   Temp 98.4 F (36.9 C) (Oral)   Resp 19   Ht 5' 1 (1.549 m)   Wt 63.5 kg   SpO2 96%   BMI 26.45 kg/m   Physical Exam Vitals and nursing note reviewed.  Constitutional:      General: She is not in acute distress.    Appearance: She is well-developed.  HENT:     Head: Normocephalic and atraumatic.  Eyes:     Conjunctiva/sclera: Conjunctivae normal.  Cardiovascular:     Rate and Rhythm: Normal rate and regular rhythm.     Heart sounds: No murmur heard. Pulmonary:     Effort: Pulmonary effort is normal. No respiratory distress.     Breath sounds: Normal breath sounds.  Chest:     Chest wall: Tenderness present.     Comments: There is left chest wall and left shoulder tenderness to palpation, pain with movement of arm Abdominal:     Palpations: Abdomen is soft.      Tenderness: There is no abdominal tenderness.  Musculoskeletal:        General: No swelling.     Cervical back: Neck supple.  Skin:    General: Skin is warm and dry.     Capillary Refill: Capillary refill takes less than 2 seconds.  Neurological:     Mental Status: She is alert.  Psychiatric:        Mood and Affect: Mood normal.     (all labs ordered are listed, but only abnormal results are displayed) Labs Reviewed  CBC WITH DIFFERENTIAL/PLATELET - Abnormal; Notable for the following components:  Result Value   RBC 3.76 (*)    Hemoglobin 11.2 (*)    HCT 35.2 (*)    Monocytes Absolute 1.4 (*)    All other components within normal limits  BASIC METABOLIC PANEL WITH GFR  TROPONIN T, HIGH SENSITIVITY    EKG: None  Radiology: DG Shoulder Left Portable Result Date: 09/01/2024 EXAM: 1 VIEW(S) XRAY OF THE LEFT SHOULDER 09/01/2024 07:54:00 AM COMPARISON: AP radiograph of the chest dated 09/01/2024 and CT of the chest dated 07/22/2024. CLINICAL HISTORY: shoulder pain FINDINGS: BONES AND JOINTS: Glenohumeral joint is normally aligned. No acute fracture. No malalignment. The [facility] joint is unremarkable. There is degenerative cyst formation within the lesser tuberosity, suggesting chronic rotator cuff disease. SOFT TISSUES: No abnormal calcifications. There is a large left perihilar mass measuring approximately 9 cm again demonstrated. IMPRESSION: 1. Degenerative cyst formation within the lesser tuberosity, suggesting chronic rotator cuff disease. 2. Large left perihilar mass. Electronically signed by: Evalene Coho MD 09/01/2024 08:05 AM EST RP Workstation: [id number]   DG Chest 1 View Result Date: 09/01/2024 EXAM: 1 VIEW(S) XRAY OF THE CHEST 09/01/2024 07:54:00 AM COMPARISON: CT chest 07/22/2024 CLINICAL HISTORY: left shoulder pain chest pain left shoulder pain chest pain left shoulder pain chest pain left shoulder pain chest pain left shoulder pain chest pain FINDINGS: LINES, TUBES AND  DEVICES: Right chest port with catheter tip overlying the SVC. LUNGS AND PLEURA: Large left upper lobe lung mass at least 7.1 x 6.4 cm, unchanged from CT chest dated 07/22/2024. No pleural effusion. No pneumothorax. HEART AND MEDIASTINUM: Aortic arch atherosclerosis. No acute abnormality of the cardiac and mediastinal silhouettes. BONES AND SOFT TISSUES: Dextroconvex lower thoracic curvature. No acute osseous abnormality. IMPRESSION: 1. No acute findings. 2. Large left upper lobe lung mass, at least 7.1 x 6.4 cm, unchanged from CT chest dated 07/22/2024. Electronically signed by: Waddell Calk MD 09/01/2024 08:03 AM EST RP Workstation: [id number]     Procedures   Medications Ordered in the ED  HYDROcodone -acetaminophen  (NORCO/VICODIN) 5-325 MG per tablet 1 tablet (1 tablet Oral Given 09/01/24 0742)  methocarbamol  (ROBAXIN ) tablet 1,000 mg (1,000 mg Oral Given 09/01/24 0939)  heparin  lock flush 100 unit/mL (500 Units Intracatheter Given 09/01/24 0940)                                    Medical Decision Making Cardiac monitor interpretation: Sinus rhythm, no ectopy  Patient here for chest pain that is reproducible on exam.  Imaging, x-ray, labs and EKG are all unremarkable.  Feeling better from a dose of Norco.  Advised Tylenol  Motrin as needed for pain and follow-up with primary care.  Will start on Robaxin  to use as needed.  Advised to return for new or worsening symptoms.  She feels comfortable to plan be discharged home.  Problems Addressed: Musculoskeletal chest pain: acute illness or injury  Amount and/or Complexity of Data Reviewed External Data Reviewed: notes.    Details: Outpatient records reviewed and patient follows at the cancer center here for treatment for lung cancer Labs: ordered. Decision-making details documented in ED Course.    Details: Ordered and reviewed by me and unremarkable Radiology: ordered and independent interpretation performed. Decision-making details  documented in ED Course.    Details: Ordered and interpreted by me independently of radiology Chest x-ray: Shows no acute abnormality ECG/medicine tests: ordered and independent interpretation performed. Decision-making details documented in ED Course.    Details:  Ordered and interpreted by me in the absence of cardiology and shows sinus rhythm, no STEMI, or significant change when compared to prior EKG  Risk OTC drugs. Prescription drug management.     Final diagnoses:  Musculoskeletal chest pain    ED Discharge Orders          Ordered    methocarbamol  (ROBAXIN ) 500 MG tablet  Every 6 hours PRN        09/01/24 0855               Marris Frontera L, DO 09/01/24 1427  "

## 2024-09-01 NOTE — Discharge Instructions (Signed)
 You can take Tylenol  or Motrin as needed for pain.  You can also use aspirin as needed for pain.  Take your Robaxin  up to 4 times a day as needed.  Follow-up with your primary care doctor in 1 to 2 weeks.

## 2024-09-01 NOTE — ED Triage Notes (Signed)
 Pt c/o L sided CP since Friday. Pt denies radiation of pain or SOB.

## 2024-09-25 ENCOUNTER — Inpatient Hospital Stay: Attending: Radiation Oncology | Admitting: Oncology

## 2024-09-25 ENCOUNTER — Inpatient Hospital Stay

## 2024-10-23 ENCOUNTER — Inpatient Hospital Stay: Attending: Radiation Oncology

## 2024-10-23 ENCOUNTER — Inpatient Hospital Stay

## 2024-11-20 ENCOUNTER — Inpatient Hospital Stay

## 2024-11-20 ENCOUNTER — Inpatient Hospital Stay: Admitting: Oncology

## 2024-12-18 ENCOUNTER — Inpatient Hospital Stay
